# Patient Record
Sex: Female | Born: 1950 | Race: White | Hispanic: No | Marital: Single | Smoking: Former smoker
Health system: Southern US, Community
[De-identification: ages and names within clinical notes are randomized; demographics above are authoritative.]

## PROBLEM LIST (undated history)

## (undated) DIAGNOSIS — E119 Type 2 diabetes mellitus without complications: Secondary | ICD-10-CM

## (undated) DIAGNOSIS — I10 Essential (primary) hypertension: Secondary | ICD-10-CM

---

## 2019-06-09 ENCOUNTER — Other Ambulatory Visit: Payer: Self-pay

## 2019-06-09 ENCOUNTER — Emergency Department: Payer: Medicare Other

## 2019-06-09 ENCOUNTER — Encounter: Payer: Self-pay | Admitting: *Deleted

## 2019-06-09 ENCOUNTER — Emergency Department
Admission: EM | Admit: 2019-06-09 | Discharge: 2019-06-09 | Disposition: A | Discharge status: Home / Self Care | Payer: Medicare Other | Attending: Emergency Medicine | Admitting: Emergency Medicine

## 2019-06-09 DIAGNOSIS — F1010 Alcohol abuse, uncomplicated: Secondary | ICD-10-CM

## 2019-06-09 DIAGNOSIS — F10232 Alcohol dependence with withdrawal with perceptual disturbance: Secondary | ICD-10-CM | POA: Diagnosis not present

## 2019-06-09 DIAGNOSIS — E119 Type 2 diabetes mellitus without complications: Secondary | ICD-10-CM | POA: Insufficient documentation

## 2019-06-09 DIAGNOSIS — Z87891 Personal history of nicotine dependence: Secondary | ICD-10-CM | POA: Insufficient documentation

## 2019-06-09 DIAGNOSIS — R2241 Localized swelling, mass and lump, right lower limb: Secondary | ICD-10-CM | POA: Insufficient documentation

## 2019-06-09 DIAGNOSIS — R4689 Other symptoms and signs involving appearance and behavior: Secondary | ICD-10-CM | POA: Diagnosis present

## 2019-06-09 DIAGNOSIS — R443 Hallucinations, unspecified: Secondary | ICD-10-CM | POA: Insufficient documentation

## 2019-06-09 HISTORY — DX: Type 2 diabetes mellitus without complications: E11.9

## 2019-06-09 LAB — COMPREHENSIVE METABOLIC PANEL
ALT: 45 U/L — ABNORMAL HIGH (ref 0–44)
AST: 119 U/L — ABNORMAL HIGH (ref 15–41)
Albumin: 3.2 g/dL — ABNORMAL LOW (ref 3.5–5.0)
Alkaline Phosphatase: 87 U/L (ref 38–126)
Anion gap: 9 (ref 5–15)
BUN: 31 mg/dL — ABNORMAL HIGH (ref 8–23)
CO2: 21 mmol/L — ABNORMAL LOW (ref 22–32)
Calcium: 8.5 mg/dL — ABNORMAL LOW (ref 8.9–10.3)
Chloride: 112 mmol/L — ABNORMAL HIGH (ref 98–111)
Creatinine, Ser: 1.32 mg/dL — ABNORMAL HIGH (ref 0.44–1.00)
GFR calc Af Amer: 48 mL/min — ABNORMAL LOW (ref >60)
GFR calc non Af Amer: 42 mL/min — ABNORMAL LOW (ref >60)
Glucose, Bld: 134 mg/dL — ABNORMAL HIGH (ref 70–99)
Potassium: 3.3 mmol/L — ABNORMAL LOW (ref 3.5–5.1)
Sodium: 142 mmol/L (ref 135–145)
Total Bilirubin: 3.4 mg/dL — ABNORMAL HIGH (ref 0.3–1.2)
Total Protein: 6.1 g/dL — ABNORMAL LOW (ref 6.5–8.1)

## 2019-06-09 LAB — URINE DRUG SCREEN, QUALITATIVE (ARMC ONLY)
Amphetamines, Ur Screen: NOT DETECTED
Barbiturates, Ur Screen: NOT DETECTED
Benzodiazepine, Ur Scrn: NOT DETECTED
Cannabinoid 50 Ng, Ur ~~LOC~~: NOT DETECTED
Cocaine Metabolite,Ur ~~LOC~~: NOT DETECTED
MDMA (Ecstasy)Ur Screen: NOT DETECTED
Methadone Scn, Ur: NOT DETECTED
Opiate, Ur Screen: NOT DETECTED
Phencyclidine (PCP) Ur S: NOT DETECTED
Tricyclic, Ur Screen: NOT DETECTED

## 2019-06-09 LAB — CBC
HCT: 27.3 % — ABNORMAL LOW (ref 36.0–46.0)
Hemoglobin: 9.1 g/dL — ABNORMAL LOW (ref 12.0–15.0)
MCH: 33.8 pg (ref 26.0–34.0)
MCHC: 33.3 g/dL (ref 30.0–36.0)
MCV: 101.5 fL — ABNORMAL HIGH (ref 80.0–100.0)
Platelets: 45 10*3/uL — ABNORMAL LOW (ref 150–400)
RBC: 2.69 MIL/uL — ABNORMAL LOW (ref 3.87–5.11)
RDW: 13.4 % (ref 11.5–15.5)
WBC: 3.5 10*3/uL — ABNORMAL LOW (ref 4.0–10.5)
nRBC: 0 % (ref 0.0–0.2)

## 2019-06-09 LAB — URINALYSIS, COMPLETE (UACMP) WITH MICROSCOPIC
Bacteria, UA: NONE SEEN
Bilirubin Urine: NEGATIVE
Glucose, UA: NEGATIVE mg/dL
Hgb urine dipstick: NEGATIVE
Ketones, ur: NEGATIVE mg/dL
Leukocytes,Ua: NEGATIVE
Nitrite: NEGATIVE
Protein, ur: NEGATIVE mg/dL
Specific Gravity, Urine: 1.018 (ref 1.005–1.030)
pH: 5 (ref 5.0–8.0)

## 2019-06-09 LAB — ACETAMINOPHEN LEVEL: Acetaminophen (Tylenol), Serum: <10 ug/mL — ABNORMAL LOW (ref 10–30)

## 2019-06-09 LAB — ETHANOL: Alcohol, Ethyl (B): 76 mg/dL — ABNORMAL HIGH (ref <10)

## 2019-06-09 LAB — AMMONIA: Ammonia: 32 umol/L (ref 9–35)

## 2019-06-09 LAB — TSH: TSH: 1.92 u[IU]/mL (ref 0.350–4.500)

## 2019-06-09 LAB — SALICYLATE LEVEL: Salicylate Lvl: <7 mg/dL (ref 2.8–30.0)

## 2019-06-09 LAB — GLUCOSE, CAPILLARY: Glucose-Capillary: 118 mg/dL — ABNORMAL HIGH (ref 70–99)

## 2019-06-09 MED ORDER — FOLIC ACID 1 MG PO TABS
2.0000 mg | ORAL_TABLET | Freq: Once | ORAL | Status: AC
  Administered 2019-06-09: 19:00:00 2 mg via ORAL
  Filled 2019-06-09: qty 2

## 2019-06-09 MED ORDER — VITAMIN B-1 100 MG PO TABS
100.0000 mg | ORAL_TABLET | ORAL | Status: AC
  Administered 2019-06-09: 100 mg via ORAL
  Filled 2019-06-09: qty 1

## 2019-06-09 NOTE — ED Triage Notes (Signed)
Pt to ED to talk to psychiatry. Pt denies wanting to tell this RN in triage. Pt is calm and cooperative.   PT also requesting her family only be updated about the behavioral visit but not about any medical treatment.

## 2019-06-09 NOTE — ED Notes (Signed)
Pt anxious, walking away from bed.  States she does still want to speak with a psychiatrist.  Maintained on 15 minute checks.

## 2019-06-09 NOTE — ED Notes (Signed)
Pt speaking with SOC. 

## 2019-06-09 NOTE — ED Notes (Signed)
Throughout the triage process pt verbalized she is here for alcohol detox and resources or admission as needed. Pt denies SI and Denies HI. No hallucinations or drug use.

## 2019-06-09 NOTE — ED Notes (Signed)
Report to include Situation, Background, Assessment, and Recommendations received from Amy B. RN. Patient alert, warm and dry, in no acute distress. Patient denies SI, HI, AVH and pain. Patient made aware of Q15 minute rounds and Rover and Officer presence for their safety. Patient instructed to come to me with needs or concerns.  

## 2019-06-09 NOTE — ED Provider Notes (Signed)
[facility] Provider Note   ____________________________________________   First MD Initiated Contact with Patient 06/09/19 1615     (approximate)  I have reviewed the triage vital signs and the nursing notes.   HISTORY  Chief Complaint Behavior Problem    HPI Sheila [facility]man is a 68 y.o. female evaluation for "alcohol"  Patient tells me that her family asked that she come here for evaluation because of an alcohol problem.  She reports she has been drinking will drink several drinks of alcohol a day, has been a drinker for several years but has increased her alcohol consumption since retiring from [employer].  Over the last 3 to 4 weeks she is also started noticed at night that she feels at times that she hears things are like someone is breaking into her home.  She acknowledges these are not real but feels that she is having hallucinations at night of people breaking into her house.  Denies any desire to hurt her self or harm anyone else.  She does report she is a heavy drinker, she does drink regularly.  She also reports that she does have a history of liver disease.   Past Medical History:  Diagnosis Date   Diabetes mellitus without complication (HCC)     There are no active problems to display for this patient.     Prior to Admission medications   Not on File    Allergies Patient has no allergy information on record.  No family history on file.  Social History Social History   Tobacco Use   Smoking status: Former Smoker   Smokeless tobacco: Never Used  Substance Use Topics   Alcohol use: Yes    Comment: daily   Drug use: Never    Review of Systems Constitutional: No fever/chills and no recent illness Eyes: No visual changes. ENT: No sore throat. Cardiovascular: Denies chest pain. Respiratory: Denies shortness of breath. Gastrointestinal: No abdominal pain.   Genitourinary: Negative for  dysuria. Musculoskeletal: Negative for back pain. Skin: Negative for rash. Neurological: Negative for headaches, areas of focal weakness or numbness.    ____________________________________________   PHYSICAL EXAM:  VITAL SIGNS: ED Triage Vitals  Enc Vitals Group     BP 06/09/19 1536 (!) 131/101     Pulse Rate 06/09/19 1536 77     Resp 06/09/19 1536 16     Temp 06/09/19 1536 98.7 F (37.1 C)     Temp Source 06/09/19 1536 Oral     SpO2 06/09/19 1536 97 %     Weight 06/09/19 1537 137 lb (62.1 kg)     Height 06/09/19 1537 5' 4.75" (1.645 m)     Head Circumference --      Peak Flow --      Pain Score 06/09/19 1537 0     Pain Loc --      Pain Edu? --      Excl. in GC? --     Constitutional: Alert and oriented. Well appearing and in no acute distress.  She is pleasant and calm.  Shows no distress or tremors. Eyes: Conjunctivae are normal. Head: Atraumatic. Nose: No congestion/rhinnorhea. Mouth/Throat: Mucous membranes are moist. Neck: No stridor.  Cardiovascular: Normal rate, regular rhythm. Grossly normal heart sounds very soft systolic murmur.  Good peripheral circulation. Respiratory: Normal respiratory effort.  No retractions. Lungs CTAB. Gastrointestinal: Soft and nontender. No distention. Musculoskeletal: No lower extremity tenderness nor edema except the right lower extremity seems slightly swollen compared  to the left.  No nystagmus.  No ataxia.  Upper extremities normal movements without tremor. Neurologic:  Normal speech and language. No gross focal neurologic deficits are appreciated.  Skin:  Skin is warm, dry and intact. No rash noted except for some lesions over the anterior shins bilateral which patient reports are psoriasis and appear consistent with without signs of superinfection. Psychiatric: Mood and affect are normal. Speech and behavior are normal.  She is pleasant.  Well oriented.  Denies desire to harm herself or anyone else.  No confabulation noted.  No  confusion noted.  Alert and well oriented able to give a reasonable history without tangential story.  ____________________________________________   LABS (all labs ordered are listed, but only abnormal results are displayed)  Labs Reviewed  COMPREHENSIVE METABOLIC PANEL - Abnormal; Notable for the following components:      Result Value   Potassium 3.3 (*)    Chloride 112 (*)    CO2 21 (*)    Glucose, Bld 134 (*)    BUN 31 (*)    Creatinine, Ser 1.32 (*)    Calcium 8.5 (*)    Total Protein 6.1 (*)    Albumin 3.2 (*)    AST 119 (*)    ALT 45 (*)    Total Bilirubin 3.4 (*)    GFR calc non Af Amer 42 (*)    GFR calc Af Amer 48 (*)    All other components within normal limits  ETHANOL - Abnormal; Notable for the following components:   Alcohol, Ethyl (B) 76 (*)    All other components within normal limits  ACETAMINOPHEN LEVEL - Abnormal; Notable for the following components:   Acetaminophen (Tylenol), Serum <10 (*)    All other components within normal limits  CBC - Abnormal; Notable for the following components:   WBC 3.5 (*)    RBC 2.69 (*)    Hemoglobin 9.1 (*)    HCT 27.3 (*)    MCV 101.5 (*)    Platelets 45 (*)    All other components within normal limits  GLUCOSE, CAPILLARY - Abnormal; Notable for the following components:   Glucose-Capillary 118 (*)    All other components within normal limits  SALICYLATE LEVEL  URINE DRUG SCREEN, QUALITATIVE ([facility] ONLY)  AMMONIA  TSH  URINALYSIS, COMPLETE (UACMP) WITH MICROSCOPIC   Lab work is reviewed, notable is a somewhat picture of pancytopenia.  However outpatient evaluation in the [facility] system demonstrates similar in the past, also has a history of transaminitis and cryptogenic cirrhosis.  I do not find acute changes in her lab work that is resulted to this point at 440, but I will add on a TSH and ammonia as well.  The [facility] system in November her T bili was 3.8, AST was also elevated at 78, somewhat elevated compared to  been, but has had similar LFTs as well back to about 2017. ____________________________________________  EKG   ____________________________________________  RADIOLOGY  Ct Head Wo Contrast  Result Date: 06/09/2019 CLINICAL DATA:  Hallucinations, history diabetes mellitus, former smoker EXAM: CT HEAD WITHOUT CONTRAST TECHNIQUE: Contiguous axial images were obtained from the base of the skull through the vertex without intravenous contrast. Sagittal and coronal MPR images reconstructed from axial data set. COMPARISON:  None FINDINGS: Brain: Mild atrophy. Normal ventricular morphology. No midline shift or mass effect. Minimal small vessel chronic ischemic changes of deep cerebral white matter. Benign-appearing basal ganglia calcifications. No intracranial hemorrhage, mass lesion, or evidence acute infarction. No  extra-axial fluid collections. Vascular: Unremarkable Skull: Intact Sinuses/Orbits: Clear Other: N/A IMPRESSION: Atrophy with minimal small vessel chronic ischemic changes of deep cerebral white matter. No acute intracranial abnormalities. Electronically Signed   By: Ulyses SouthwardMark  Boles M.D.   On: 06/09/2019 17:27   [place] Venous Img Lower Unilateral Right  Result Date: 06/09/2019 CLINICAL DATA:  68 year old female with a history of edema EXAM: RIGHT LOWER EXTREMITY VENOUS DOPPLER ULTRASOUND TECHNIQUE: Gray-scale sonography with graded compression, as well as color Doppler and duplex ultrasound were performed to evaluate the lower extremity deep venous systems from the level of the common femoral vein and including the common femoral, femoral, profunda femoral, popliteal and calf veins including the posterior tibial, peroneal and gastrocnemius veins when visible. The superficial great saphenous vein was also interrogated. Spectral Doppler was utilized to evaluate flow at rest and with distal augmentation maneuvers in the common femoral, femoral and popliteal veins. COMPARISON:  None. FINDINGS: Contralateral  Common Femoral Vein: Respiratory phasicity is normal and symmetric with the symptomatic side. No evidence of thrombus. Normal compressibility. Common Femoral Vein: No evidence of thrombus. Normal compressibility, respiratory phasicity and response to augmentation. Saphenofemoral Junction: No evidence of thrombus. Normal compressibility and flow on color Doppler imaging. Profunda Femoral Vein: No evidence of thrombus. Normal compressibility and flow on color Doppler imaging. Femoral Vein: No evidence of thrombus. Normal compressibility, respiratory phasicity and response to augmentation. Popliteal Vein: No evidence of thrombus. Normal compressibility, respiratory phasicity and response to augmentation. Calf Veins: No evidence of thrombus. Normal compressibility and flow on color Doppler imaging. Superficial Great Saphenous Vein: No evidence of thrombus. Normal compressibility and flow on color Doppler imaging. Other Findings:  Edema IMPRESSION: Sonographic survey of the right lower extremity negative for DVT Edema of the right leg Electronically Signed   By: Gilmer MorJaime  Wagner D.O.   On: 06/09/2019 17:12    CT head negative for acute.  Right lower extremity negative for DVT.  Based on my history and review I suspect her lower extremity may be secondary to her liver disease ____________________________________________   PROCEDURES  Procedure(s) performed: None  Procedures  Critical Care performed: No  ____________________________________________   INITIAL IMPRESSION / ASSESSMENT AND PLAN / ED COURSE  Pertinent labs & imaging results that were available during my care of the patient were reviewed by me and considered in my medical decision making (see chart for details).   Patient presents for concerns of alcohol abuse, voluntary.  Associated with occasional hallucinations or paranoia at night.  Clinical Course as of Jun 08 2016  Tue Jun 09, 2019  1825 Patient conversing with tele-psychiatrist    [MQ]    Clinical Course User Index [MQ] Sharyn CreamerQuale, Cameren Odwyer, MD   Ammonia  TSH reviewed  A known pancytopenia is present.  Transaminitis is also present but appears to be chronic in nature I do not believe is contributing to her mental status changes today.   CT the head  RLE ultrasound   ----------------------------------------- 8:18 PM on 06/09/2019 -----------------------------------------  Patient discharged.  Patient agreeable.  She will follow closely with her primary doctor at [facility], and also seek outpatient treatment including AA.  I discussed the case with Dr. Annitta JerseySpragg of psychiatry who also agrees with outpatient, patient does not currently wish to receive detox treatment as an inpatient.  Return precautions and treatment recommendations and follow-up discussed with the patient who is agreeable with the plan.   ____________________________________________   FINAL CLINICAL IMPRESSION(S) / ED DIAGNOSES  Final diagnoses:  Alcohol abuse  Alcohol dependence with perceptual disturbance [facility](HCC)        Note:  This document was prepared using Dragon voice recognition software and may include unintentional dictation errors       Sharyn CreamerQuale, Mette Southgate, MD 06/09/19 2018

## 2019-06-09 NOTE — Discharge Instructions (Signed)

## 2020-02-18 ENCOUNTER — Emergency Department: Payer: Medicare Other

## 2020-02-18 ENCOUNTER — Other Ambulatory Visit: Payer: Self-pay

## 2020-02-18 ENCOUNTER — Encounter: Payer: Self-pay | Admitting: Emergency Medicine

## 2020-02-18 ENCOUNTER — Inpatient Hospital Stay
Admission: EM | Admit: 2020-02-18 | Discharge: 2020-02-25 | DRG: 896 | Disposition: A | Discharge status: Home Health | Payer: Medicare Other | Attending: Internal Medicine | Admitting: Internal Medicine

## 2020-02-18 DIAGNOSIS — Z66 Do not resuscitate: Secondary | ICD-10-CM

## 2020-02-18 DIAGNOSIS — G9341 Metabolic encephalopathy: Secondary | ICD-10-CM | POA: Diagnosis not present

## 2020-02-18 DIAGNOSIS — G9340 Encephalopathy, unspecified: Secondary | ICD-10-CM

## 2020-02-18 DIAGNOSIS — Z6822 Body mass index (BMI) 22.0-22.9, adult: Secondary | ICD-10-CM

## 2020-02-18 DIAGNOSIS — Z79899 Other long term (current) drug therapy: Secondary | ICD-10-CM

## 2020-02-18 DIAGNOSIS — F10251 Alcohol dependence with alcohol-induced psychotic disorder with hallucinations: Secondary | ICD-10-CM | POA: Diagnosis not present

## 2020-02-18 DIAGNOSIS — F10239 Alcohol dependence with withdrawal, unspecified: Secondary | ICD-10-CM | POA: Diagnosis not present

## 2020-02-18 DIAGNOSIS — R410 Disorientation, unspecified: Secondary | ICD-10-CM

## 2020-02-18 DIAGNOSIS — F1019 Alcohol abuse with unspecified alcohol-induced disorder: Secondary | ICD-10-CM | POA: Diagnosis not present

## 2020-02-18 DIAGNOSIS — D696 Thrombocytopenia, unspecified: Secondary | ICD-10-CM | POA: Diagnosis present

## 2020-02-18 DIAGNOSIS — E119 Type 2 diabetes mellitus without complications: Secondary | ICD-10-CM

## 2020-02-18 DIAGNOSIS — I10 Essential (primary) hypertension: Secondary | ICD-10-CM | POA: Diagnosis not present

## 2020-02-18 DIAGNOSIS — Z87891 Personal history of nicotine dependence: Secondary | ICD-10-CM

## 2020-02-18 DIAGNOSIS — F909 Attention-deficit hyperactivity disorder, unspecified type: Secondary | ICD-10-CM | POA: Diagnosis present

## 2020-02-18 DIAGNOSIS — E876 Hypokalemia: Secondary | ICD-10-CM | POA: Diagnosis not present

## 2020-02-18 DIAGNOSIS — D638 Anemia in other chronic diseases classified elsewhere: Secondary | ICD-10-CM | POA: Diagnosis present

## 2020-02-18 DIAGNOSIS — E878 Other disorders of electrolyte and fluid balance, not elsewhere classified: Secondary | ICD-10-CM | POA: Diagnosis present

## 2020-02-18 DIAGNOSIS — I5032 Chronic diastolic (congestive) heart failure: Secondary | ICD-10-CM | POA: Diagnosis present

## 2020-02-18 DIAGNOSIS — Z7189 Other specified counseling: Secondary | ICD-10-CM

## 2020-02-18 DIAGNOSIS — E11649 Type 2 diabetes mellitus with hypoglycemia without coma: Secondary | ICD-10-CM | POA: Diagnosis not present

## 2020-02-18 DIAGNOSIS — K7030 Alcoholic cirrhosis of liver without ascites: Secondary | ICD-10-CM | POA: Diagnosis not present

## 2020-02-18 DIAGNOSIS — I110 Hypertensive heart disease with heart failure: Secondary | ICD-10-CM | POA: Diagnosis present

## 2020-02-18 DIAGNOSIS — Z23 Encounter for immunization: Secondary | ICD-10-CM

## 2020-02-18 DIAGNOSIS — R627 Adult failure to thrive: Secondary | ICD-10-CM

## 2020-02-18 DIAGNOSIS — Z515 Encounter for palliative care: Secondary | ICD-10-CM

## 2020-02-18 DIAGNOSIS — Z20822 Contact with and (suspected) exposure to covid-19: Secondary | ICD-10-CM | POA: Diagnosis present

## 2020-02-18 DIAGNOSIS — Z8249 Family history of ischemic heart disease and other diseases of the circulatory system: Secondary | ICD-10-CM

## 2020-02-18 DIAGNOSIS — R296 Repeated falls: Secondary | ICD-10-CM | POA: Diagnosis present

## 2020-02-18 DIAGNOSIS — Y904 Blood alcohol level of 80-99 mg/100 ml: Secondary | ICD-10-CM | POA: Diagnosis present

## 2020-02-18 HISTORY — DX: Essential (primary) hypertension: I10

## 2020-02-18 LAB — URINALYSIS, COMPLETE (UACMP) WITH MICROSCOPIC
Bilirubin Urine: NEGATIVE
Glucose, UA: NEGATIVE mg/dL
Ketones, ur: NEGATIVE mg/dL
Nitrite: POSITIVE — AB
Protein, ur: 30 mg/dL — AB
Specific Gravity, Urine: 1.02 (ref 1.005–1.030)
pH: 6 (ref 5.0–8.0)

## 2020-02-18 LAB — COMPREHENSIVE METABOLIC PANEL
ALT: 32 U/L (ref 0–44)
AST: 87 U/L — ABNORMAL HIGH (ref 15–41)
Albumin: 3.2 g/dL — ABNORMAL LOW (ref 3.5–5.0)
Alkaline Phosphatase: 94 U/L (ref 38–126)
Anion gap: 8 (ref 5–15)
BUN: 13 mg/dL (ref 8–23)
CO2: 26 mmol/L (ref 22–32)
Calcium: 8.5 mg/dL — ABNORMAL LOW (ref 8.9–10.3)
Chloride: 111 mmol/L (ref 98–111)
Creatinine, Ser: 0.66 mg/dL (ref 0.44–1.00)
GFR calc Af Amer: >60 mL/min (ref >60)
GFR calc non Af Amer: >60 mL/min (ref >60)
Glucose, Bld: 85 mg/dL (ref 70–99)
Potassium: 3.2 mmol/L — ABNORMAL LOW (ref 3.5–5.1)
Sodium: 145 mmol/L (ref 135–145)
Total Bilirubin: 5.2 mg/dL — ABNORMAL HIGH (ref 0.3–1.2)
Total Protein: 6.6 g/dL (ref 6.5–8.1)

## 2020-02-18 LAB — URINE DRUG SCREEN, QUALITATIVE (ARMC ONLY)
Amphetamines, Ur Screen: NOT DETECTED
Barbiturates, Ur Screen: NOT DETECTED
Benzodiazepine, Ur Scrn: NOT DETECTED
Cannabinoid 50 Ng, Ur ~~LOC~~: NOT DETECTED
Cocaine Metabolite,Ur ~~LOC~~: NOT DETECTED
MDMA (Ecstasy)Ur Screen: NOT DETECTED
Methadone Scn, Ur: NOT DETECTED
Opiate, Ur Screen: NOT DETECTED
Phencyclidine (PCP) Ur S: NOT DETECTED
Tricyclic, Ur Screen: NOT DETECTED

## 2020-02-18 LAB — CBC
HCT: 28.3 % — ABNORMAL LOW (ref 36.0–46.0)
Hemoglobin: 9.1 g/dL — ABNORMAL LOW (ref 12.0–15.0)
MCH: 31.9 pg (ref 26.0–34.0)
MCHC: 32.2 g/dL (ref 30.0–36.0)
MCV: 99.3 fL (ref 80.0–100.0)
Platelets: 56 10*3/uL — ABNORMAL LOW (ref 150–400)
RBC: 2.85 MIL/uL — ABNORMAL LOW (ref 3.87–5.11)
RDW: 14.2 % (ref 11.5–15.5)
WBC: 4.3 10*3/uL (ref 4.0–10.5)
nRBC: 0 % (ref 0.0–0.2)

## 2020-02-18 LAB — HIV ANTIBODY (ROUTINE TESTING W REFLEX): HIV Screen 4th Generation wRfx: NONREACTIVE

## 2020-02-18 LAB — GLUCOSE, CAPILLARY
Glucose-Capillary: 80 mg/dL (ref 70–99)
Glucose-Capillary: 79 mg/dL (ref 70–99)
Glucose-Capillary: 72 mg/dL (ref 70–99)
Glucose-Capillary: 73 mg/dL (ref 70–99)

## 2020-02-18 LAB — C-REACTIVE PROTEIN: CRP: 0.6 mg/dL (ref <1.0)

## 2020-02-18 LAB — TSH: TSH: 2.88 u[IU]/mL (ref 0.350–4.500)

## 2020-02-18 LAB — SEDIMENTATION RATE: Sed Rate: 14 mm/hr (ref 0–30)

## 2020-02-18 LAB — VITAMIN B12: Vitamin B-12: 758 pg/mL (ref 180–914)

## 2020-02-18 LAB — RESPIRATORY PANEL BY RT PCR (FLU A&B, COVID)
Influenza A by PCR: NEGATIVE
Influenza B by PCR: NEGATIVE
SARS Coronavirus 2 by RT PCR: NEGATIVE

## 2020-02-18 LAB — PHOSPHORUS: Phosphorus: 3.6 mg/dL (ref 2.5–4.6)

## 2020-02-18 LAB — AMMONIA: Ammonia: 48 umol/L — ABNORMAL HIGH (ref 9–35)

## 2020-02-18 LAB — MAGNESIUM: Magnesium: 1.9 mg/dL (ref 1.7–2.4)

## 2020-02-18 LAB — HEMOGLOBIN A1C
Hgb A1c MFr Bld: 4.7 % — ABNORMAL LOW (ref 4.8–5.6)
Mean Plasma Glucose: 88.19 mg/dL

## 2020-02-18 MED ORDER — INSULIN ASPART 100 UNIT/ML ~~LOC~~ SOLN: 0.0000 [IU] | Freq: Every day | SUBCUTANEOUS | Status: DC

## 2020-02-18 MED ORDER — FOLIC ACID 1 MG PO TABS
1.0000 mg | ORAL_TABLET | Freq: Every day | ORAL | Status: DC
  Administered 2020-02-19 – 2020-02-25 (×6): 1 mg via ORAL
  Filled 2020-02-18 (×7): qty 1

## 2020-02-18 MED ORDER — ACETAMINOPHEN 325 MG PO TABS
650.0000 mg | ORAL_TABLET | Freq: Four times a day (QID) | ORAL | Status: DC | PRN
  Administered 2020-02-18: 650 mg via ORAL
  Filled 2020-02-18: qty 2

## 2020-02-18 MED ORDER — ONDANSETRON HCL 4 MG/2ML IJ SOLN: 4.0000 mg | Freq: Four times a day (QID) | INTRAMUSCULAR | Status: DC | PRN

## 2020-02-18 MED ORDER — ONDANSETRON HCL 4 MG PO TABS: 4.0000 mg | ORAL_TABLET | Freq: Four times a day (QID) | ORAL | Status: DC | PRN

## 2020-02-18 MED ORDER — POTASSIUM CHLORIDE CRYS ER 20 MEQ PO TBCR
40.0000 meq | EXTENDED_RELEASE_TABLET | Freq: Every day | ORAL | Status: DC
  Administered 2020-02-18 – 2020-02-25 (×7): 40 meq via ORAL
  Filled 2020-02-18 (×8): qty 2

## 2020-02-18 MED ORDER — FOLIC ACID 1 MG PO TABS: 1.0000 mg | ORAL_TABLET | Freq: Every day | ORAL | Status: DC

## 2020-02-18 MED ORDER — LORAZEPAM 1 MG PO TABS
1.0000 mg | ORAL_TABLET | ORAL | Status: AC | PRN
  Filled 2020-02-18: qty 2

## 2020-02-18 MED ORDER — SODIUM CHLORIDE 0.9% FLUSH
3.0000 mL | Freq: Two times a day (BID) | INTRAVENOUS | Status: DC
  Administered 2020-02-18 – 2020-02-25 (×12): 3 mL via INTRAVENOUS

## 2020-02-18 MED ORDER — LORAZEPAM 2 MG/ML IJ SOLN
1.0000 mg | INTRAMUSCULAR | Status: AC | PRN
  Administered 2020-02-18 – 2020-02-20 (×3): 2 mg via INTRAVENOUS
  Administered 2020-02-20: 4 mg via INTRAVENOUS
  Administered 2020-02-20: 2 mg via INTRAVENOUS
  Administered 2020-02-20: 3 mg via INTRAVENOUS
  Administered 2020-02-21: 2 mg via INTRAVENOUS
  Administered 2020-02-21: 04:00:00 4 mg via INTRAVENOUS
  Filled 2020-02-18 (×2): qty 1
  Filled 2020-02-18: qty 2
  Filled 2020-02-18 (×2): qty 1

## 2020-02-18 MED ORDER — LORAZEPAM 2 MG/ML IJ SOLN
0.0000 mg | Freq: Four times a day (QID) | INTRAMUSCULAR | Status: AC
  Filled 2020-02-18: qty 1

## 2020-02-18 MED ORDER — THIAMINE HCL 100 MG/ML IJ SOLN
100.0000 mg | Freq: Once | INTRAMUSCULAR | Status: AC
  Administered 2020-02-18: 09:00:00 100 mg via INTRAVENOUS
  Filled 2020-02-18: qty 2

## 2020-02-18 MED ORDER — IRBESARTAN 150 MG PO TABS
300.0000 mg | ORAL_TABLET | Freq: Every day | ORAL | Status: DC
  Administered 2020-02-18 – 2020-02-25 (×7): 300 mg via ORAL
  Filled 2020-02-18 (×8): qty 2

## 2020-02-18 MED ORDER — LORAZEPAM 2 MG PO TABS
0.0000 mg | ORAL_TABLET | Freq: Two times a day (BID) | ORAL | Status: AC
  Administered 2020-02-21: 2 mg via ORAL
  Filled 2020-02-18: qty 1

## 2020-02-18 MED ORDER — LORAZEPAM 2 MG PO TABS
0.0000 mg | ORAL_TABLET | Freq: Four times a day (QID) | ORAL | Status: AC
  Administered 2020-02-18 – 2020-02-19 (×2): 2 mg via ORAL
  Filled 2020-02-18 (×2): qty 1

## 2020-02-18 MED ORDER — ACETAMINOPHEN 650 MG RE SUPP: 650.0000 mg | Freq: Four times a day (QID) | RECTAL | Status: DC | PRN

## 2020-02-18 MED ORDER — METHYLPHENIDATE HCL ER (OSM) 36 MG PO TBCR: 36.0000 mg | EXTENDED_RELEASE_TABLET | Freq: Every morning | ORAL | Status: DC

## 2020-02-18 MED ORDER — ENSURE ENLIVE PO LIQD
237.0000 mL | Freq: Two times a day (BID) | ORAL | Status: DC
  Administered 2020-02-19 – 2020-02-23 (×4): 237 mL via ORAL

## 2020-02-18 MED ORDER — LACTULOSE 10 GM/15ML PO SOLN: 10.0000 g | Freq: Two times a day (BID) | ORAL | Status: DC | PRN

## 2020-02-18 MED ORDER — HYDROCHLOROTHIAZIDE 25 MG PO TABS
12.5000 mg | ORAL_TABLET | Freq: Every day | ORAL | Status: DC
  Administered 2020-02-18 – 2020-02-25 (×7): 12.5 mg via ORAL
  Filled 2020-02-18 (×8): qty 1

## 2020-02-18 MED ORDER — THIAMINE HCL 100 MG/ML IJ SOLN
400.0000 mg | Freq: Once | INTRAMUSCULAR | Status: AC
  Administered 2020-02-18: 400 mg via INTRAVENOUS
  Filled 2020-02-18: qty 4

## 2020-02-18 MED ORDER — LORAZEPAM 2 MG/ML IJ SOLN
0.0000 mg | Freq: Two times a day (BID) | INTRAMUSCULAR | Status: AC
  Administered 2020-02-20 – 2020-02-21 (×2): 2 mg via INTRAVENOUS
  Administered 2020-02-22: 1 mg via INTRAVENOUS
  Filled 2020-02-18: qty 2
  Filled 2020-02-18: qty 1
  Filled 2020-02-18: qty 2
  Filled 2020-02-18 (×2): qty 1

## 2020-02-18 MED ORDER — ADULT MULTIVITAMIN W/MINERALS CH
1.0000 | ORAL_TABLET | Freq: Every day | ORAL | Status: DC
  Administered 2020-02-18 – 2020-02-25 (×7): 1 via ORAL
  Filled 2020-02-18 (×8): qty 1

## 2020-02-18 MED ORDER — INSULIN ASPART 100 UNIT/ML ~~LOC~~ SOLN
0.0000 [IU] | Freq: Three times a day (TID) | SUBCUTANEOUS | Status: DC
  Administered 2020-02-19: 1 [IU] via SUBCUTANEOUS
  Filled 2020-02-18: qty 1

## 2020-02-18 MED ORDER — THIAMINE HCL 100 MG/ML IJ SOLN
500.0000 mg | INTRAVENOUS | Status: DC
  Filled 2020-02-18: qty 5

## 2020-02-18 MED ORDER — INFLUENZA VAC A&B SA ADJ QUAD 0.5 ML IM PRSY
0.5000 mL | PREFILLED_SYRINGE | INTRAMUSCULAR | Status: AC
  Administered 2020-02-20: 10:00:00 0.5 mL via INTRAMUSCULAR
  Filled 2020-02-18: qty 0.5

## 2020-02-18 MED ORDER — SODIUM CHLORIDE 0.9 % IV SOLN
1.0000 mg | Freq: Once | INTRAVENOUS | Status: AC
  Administered 2020-02-18: 1 mg via INTRAVENOUS
  Filled 2020-02-18: qty 0.2

## 2020-02-18 NOTE — ED Notes (Signed)
Pt assisted to the bathroom by this RN. Pt noted to continue to have bizarre behavior with this RN. Pt noted to have began to urinate on pants, this RN assisted patient out of wet clothes and into dry gown and onto clean dry pad. Pt provided with blankets. Pt's daughter remains at bedside at this time.

## 2020-02-18 NOTE — H&P (Signed)
TRH H&P   Patient Demographics:    Sheila [facility]man, is a 69 y.o. female  MRN: [id number]   DOB - 1951-12-14  Admit Date - 02/18/2020  Outpatient Primary MD for the [facility] is [facility]  Referring MD Dr. Ellender Hose  Outpatient Specialists: None  Patient coming from: Home  Chief Complaint  Patient presents with   Altered Mental Status      HPI:    [facility]ila [facility]man  is a 69 y.o. female, with ongoing alcohol use with cryptogenic cirrhosis, chronic diastolic CHF, hypertension, diet-controlled diabetes, ADHD who was brought in by police after being found at [facility] confused this morning.  She reports that she drove there this morning after having a cocktail mix.  Reportedly she drove up without her driving license.  On questioning she reports that she was brought in here to get some tests done.  Patient is oriented to time and place but informs that she has several of the people living in her house.  Daughter present at bedside who informs that for past 8-9 months patient has been persistently getting confused, drinks regularly and drives.  She has poor sleep and has hallucinates seeing and hearing people and also gets easily distracted and confused, fills up her thoughts with unreliable stories.  She has been found trespassing in the neighborhood, daughter also concerned about falls at home and occasionally swirling feces. She was seen in the ED about 8 months back with problems with alcohol, hearing voices.  She was discharged from the ED and instructed to follow-up with her PCP.  She was seen by her PCP in September 2020, no intervention regarding her current problems were done.  She was referred to hepatology for her cirrhosis but does not follow regularly.  Patient is unable to provide a consistent history.  Patient lives alone but thinks several other people live in her  house.  Daughter visits her every other day and helps her with the meals and run errands.  She had reached out to the PCP office regarding her problems a few times earlier and was recommended to bring her to the ED but patient was refusing. Patient denies any headache, blurred vision, dizziness, falls at home, syncope, nausea, vomiting, fevers, chills, chest pain, shortness of breath, palpitations, fevers, abdominal pain, diarrhea or dysuria, tingling or numbness of extremities or muscle weakness.  She denies hearing voices or objects crawling on her skin. On asking about her alcohol intake she reports that she drinks almost on a regular basis but usually 1-2 drinks a day.  Denies any illicit drug use.  She reports that she lost some weight few months ago but now seem to have gained them back.  Course in the ED Vital stable except for elevated blood pressure 160/96 mmHg.  Blood work showed hemoglobin of 9.1 which seems unchanged from previous, chronic thrombocytopenia with platelets  of 56, potassium of 3.2, AST of 87 and glucose of 85.  Alcohol level was 91.  Ammonia level was elevated at 48.  Head CT was done without acute findings and showed moderate parenchymal atrophy and chronic small vessel ischemic disease unchanged from last CT 8 months ago.  With concern for Wernicke's encephalopathy patient given IV thiamine 500 mg and IV folate.  Hospitalist consulted for observation.   Review of systems:    In addition to the HPI above,  No Fever-chills, confusion, hallucination No Headache, No changes with Vision or hearing, No problems swallowing food or Liquids, No Chest pain, Cough or Shortness of Breath, No Abdominal pain, No Nausea or vomiting, Bowel movements are regular, No Blood in stool or Urine, No dysuria, No new skin rashes or bruises, No new joints pains-aches,  No new weakness, tingling, numbness in any extremity, No recent weight gain or loss, No polyuria, polydypsia or  polyphagia, No significant Mental Stressors.     With Past History of the following :    Past Medical History:  Diagnosis Date   Diabetes mellitus without complication (HCC)    Hypertension       History reviewed. No pertinent surgical history.    Social History:     Social History   Tobacco Use   Smoking status: Former Smoker   Smokeless tobacco: Never Used  Substance Use Topics   Alcohol use: Yes    Comment: daily     Lives -home alone  Mobility -independent     Family History :   No family history of heart disease or diabetes.   Home Medications:   Prior to Admission medications   Medication Sig Start Date End Date Taking? Authorizing Provider  hydrochlorothiazide (HYDRODIURIL) 12.5 MG tablet Take 12.5 mg by mouth daily. 05/27/19 05/26/20 Yes [provider]  methylphenidate 36 MG PO CR tablet Take 36 mg by mouth every morning. 09/25/19  Yes [provider]  olmesartan (BENICAR) 40 MG tablet Take 40 mg by mouth daily. 05/27/19 05/26/20 Yes [provider]  potassium chloride (KLOR-CON) 10 MEQ tablet Take 10 mEq by mouth daily. 10/13/19  Yes [provider]     Allergies:    No Known Allergies   Physical Exam:   Vitals  Blood pressure (!) 160/96, pulse 90, temperature 98.4 F (36.9 C), temperature source Oral, resp. rate 15, height 5\' 5"  (1.651 m), weight 56.7 kg, SpO2 100 %.    General: Elderly female lying in bed in no acute distress HEENT: Pupils reactive bilaterally, EOMI, pallor present, no icterus, moist mucosa, supple neck, no cervical adenopathy Chest: Clear to auscultation bilaterally CVS: Normal S1-S2, no murmurs rub or gallop GI: Soft, nondistended, nontender, bowel sounds present Musculoskeletal: Warm, trace pitting edema bilaterally CNS: Alert and oriented x3 (easily distracted and hallucinating about people living in her house), no nystagmus, normal motor tone and power in all extremities, gait  not assessed.    Data Review:    CBC Recent Labs  Lab 02/18/20 0739  WBC 4.3  HGB 9.1*  HCT 28.3*  PLT 56*  MCV 99.3  MCH 31.9  MCHC 32.2  RDW 14.2   ------------------------------------------------------------------------------------------------------------------  Chemistries  Recent Labs  Lab 02/18/20 0739  NA 145  K 3.2*  CL 111  CO2 26  GLUCOSE 85  BUN 13  CREATININE 0.66  CALCIUM 8.5*  AST 87*  ALT 32  ALKPHOS 94  BILITOT 5.2*   ------------------------------------------------------------------------------------------------------------------ estimated creatinine clearance is 60.2 mL/min (by  C-G formula based on SCr of 0.66 mg/dL). ------------------------------------------------------------------------------------------------------------------ No results for input(s): TSH, T4TOTAL, T3FREE, THYROIDAB in the last 72 hours.  Invalid input(s): FREET3  Coagulation profile No results for input(s): INR, PROTIME in the last 168 hours. ------------------------------------------------------------------------------------------------------------------- No results for input(s): DDIMER in the last 72 hours. -------------------------------------------------------------------------------------------------------------------  Cardiac Enzymes No results for input(s): CKMB, TROPONINI, MYOGLOBIN in the last 168 hours.  Invalid input(s): CK ------------------------------------------------------------------------------------------------------------------ No results found for: BNP   ---------------------------------------------------------------------------------------------------------------  Urinalysis    Component Value Date/Time   COLORURINE AMBER (A) 06/09/2019 1543   APPEARANCEUR TURBID (A) 06/09/2019 1543   LABSPEC 1.018 06/09/2019 1543   PHURINE 5.0 06/09/2019 1543   GLUCOSEU NEGATIVE 06/09/2019 1543   HGBUR NEGATIVE 06/09/2019 1543   BILIRUBINUR NEGATIVE  06/09/2019 1543   KETONESUR NEGATIVE 06/09/2019 1543   PROTEINUR NEGATIVE 06/09/2019 1543   NITRITE NEGATIVE 06/09/2019 1543   LEUKOCYTESUR NEGATIVE 06/09/2019 1543    ----------------------------------------------------------------------------------------------------------------   Imaging Results:    CT Head Wo Contrast  Result Date: 02/18/2020 CLINICAL DATA:  Delirium.  Additional history provided: EXAM: CT HEAD WITHOUT CONTRAST TECHNIQUE: Contiguous axial images were obtained from the base of the skull through the vertex without intravenous contrast. COMPARISON:  Head CT 06/09/2019. FINDINGS: Brain: The examination is mildly motion degraded. No evidence of acute intracranial hemorrhage. No demarcated cortical infarction. No evidence of intracranial mass. No midline shift or extra-axial fluid collection. Mild scattered ill-defined hypoattenuation within the cerebral white matter is nonspecific, but consistent with chronic small vessel ischemic disease and similar to prior exam 06/09/2019. Stable, moderate generalized parenchymal atrophy, stable. Vascular: No hyperdense vessel. Skull: Normal. Negative for fracture or focal lesion. Sinuses/Orbits: Visualized orbits demonstrate no acute abnormality. No significant paranasal sinus disease or mastoid effusion at the imaged levels. IMPRESSION: Mildly motion degraded examination. No evidence of acute intracranial abnormality. Moderate generalized parenchymal atrophy with mild chronic small vessel ischemic disease, similar to prior examination 06/09/2019. Electronically Signed   By: Jackey Loge DO   On: 02/18/2020 08:49    My personal review of EKG: Normal sinus rhythm at 87 with PVCs, no ST-T changes.   Assessment & Plan:   Principal problem   Acute metabolic encephalopathy Secondary to alcohol abuse with combination of alcohol intoxication,?  Hepatic encephalopathy and associated Wernicke's versus Korsakoff psychosis. Place in observation on  telemetry. IV hydration with normal saline.  High-dose IV thiamine 500 mg daily for 3 days.  Continue folate and multivitamin. Monitor on CIWA.  No signs of alcohol withdrawal at present but had very high risk.  Check labs for TSH, B12.  Add lactulose to treat for possible hepatic encephalopathy. With concern for Wernicke's versus Korsakoff I have asked neurology to evaluate the patient.  Also consulted psych to assess for capacity given her longstanding confusion and high risk for driving under influence.  TOC consulted to evaluate and provide outpatient resource.  Daughter feels patient is unsafe to live by herself at home.  Active problems   Alcoholic cirrhosis of liver without ascites (HCC) Has chronic thrombocytopenia.  Monitor for now.  Patient has been referred to liver clinic by her PCP but has not been adherent.  Hypokalemia Replenished.  Check magnesium    Type 2 diabetes mellitus without complication (HCC) Diet controlled.  Monitor on sliding scale coverage    Essential hypertension Resume HCTZ and ARB.  ADHD On methylphenidate which I will resume.  Daughter concerned that she is taking it without regular follow-up and monitoring and is keeping her awake  and?  Hyperactive all the time.   DVT Prophylaxis: Subcu Lovenox  AM Labs Ordered, also please review Full Orders  Family Communication: Admission, patients condition and plan of care including tests being ordered have been discussed with the patient and her daughter at bedside   Code Status full code  Likely DC to home  Condition: Fair  Consults called: Neurology, psychiatry  Admission status: Observation  Time spent in minutes : 50   Olivene Cookston M.D on 02/18/2020 at 9:28 AM  Between 7am to 7pm - Pager - [phone]. After 7pm go to [email or web address] - password [email or web address]  [facility] - Office  [phone]

## 2020-02-18 NOTE — ED Notes (Signed)
Pt with noted continued bizarre behavior. Pt calm and cooperative at this time with staff. This RN flushed IV after folic acid finished, pt then flinched, but stated "that didn't even feel that, I guess I flinched because I'm just a squirrely person".

## 2020-02-18 NOTE — TOC Initial Note (Addendum)
Transition of Care [facility]) - Initial/Assessment Note    Patient Details  Name: Calia Napp MRN: [id number] Date of Birth: October 07, 1951  Transition of Care [place]) CM/SW Contact:    Adelene Amas, [place] Phone Number: [phone] 02/18/2020, 2:20 PM  Clinical Narrative:                  Patient is having trouble following the conversation and answering questions. Patient's daughter Lestine Box, was with patient and was able to tell this SW, patient has been struggling with alcohol substance issues for many years. Ms. Dani Gobble is concerned that patient is drinking alcohol throughout the day and taking Conserta medication. Ms. Dani Gobble states it is ADHD medication.  Ms. Dani Gobble states that her mother leaves the house when she is intoxicated and will not let anyone know where she is going. Ms. Dani Gobble states "Even if you had the keys she still leaves, she'll go walking." Ms. Dani Gobble stated the patient often becomes delusional as well, and has visual hallucinations. Ms. Dani Gobble states patient is able to pay bills and buy food, but she does not eat the food she buys.  Ms. Dani Gobble stated patient has trouble with keeping up with the house maintenance and with keeping the house clean.  Ms. Dani Gobble states the house is not in good shape and she is concerned for the patient's safety in the house.  Expected Discharge Plan: OP Rehab(Substance abuse rehab facility)     Patient Goals and CMS Choice Patient states their goals for this hospitalization and ongoing recovery are:: Patient would like to return home and be independent.      Expected Discharge Plan and Services Expected Discharge Plan: OP Rehab(Substance abuse rehab facility)       Living arrangements for the past 2 months: Single Family Home                                      Prior Living Arrangements/Services Living arrangements for the past 2 months: Single Family Home Lives with:: Self Patient language and need for interpreter reviewed::  Yes Do you feel safe going back to the place where you live?: No   Patient was unable to answer question coherently. Daughter stated she is concerned about her mother living on her own and she thinks assited living would be appropiate.  Need for Family Participation in Patient Care: Yes (Comment)(Daugter expressed concerns about mother's ability to make her own decisions.)     Criminal Activity/Legal Involvement Pertinent to Current Situation/Hospitalization: No - Comment as needed  Activities of Daily Living      Permission Sought/Granted Permission sought to share information with : Family Supports Permission granted to share information with : Yes, Verbal Permission Granted     Permission granted to share info w AGENCY: Lestine Box and [place] granted to share info w Relationship: daughter/son     Emotional Assessment Appearance:: Appears stated age, Disheveled Attitude/Demeanor/Rapport: Engaged(Patient participated in conversation but had difficulty following train of thought and conversation.)   Orientation: : Oriented to Self, Oriented to Place Alcohol / Substance Use: Alcohol Use Psych Involvement: Yes (comment)(Psych consult requested.)  Admission diagnosis:  Acute metabolic encephalopathy [X44.81] Patient Active Problem List   Diagnosis Date Noted  . Acute metabolic encephalopathy 85/63/1497  . Alcoholic cirrhosis of liver without ascites ([employer]) 02/18/2020  . Alcohol abuse with alcohol-induced disorder ([place]) 02/18/2020  . Hypokalemia 02/18/2020  .  Type 2 diabetes mellitus without complication (HCC) 02/18/2020  . Essential hypertension 02/18/2020   PCP:  [facility]:   [facility] DELIVERY - [place], [place] - [address] [place] [address] Phone: [phone] Fax: [phone]  [facility] [address] - [place], [place] - [address] [place] [address] Phone: [phone] Fax: [phone]     Social Determinants of Health (SDOH) Interventions    Readmission Risk Interventions No flowsheet data found.

## 2020-02-18 NOTE — ED Notes (Signed)
Meal tray delivered by dining services  

## 2020-02-18 NOTE — Consult Note (Signed)
69 year old female with no past psychiatric history who presents with altered mental status.  Patient was unable to be evaluated because she was sleeping, however, her daughter was at bedside to provide history.  Per the daughter, the patient has not been acting within her right mind.  Daughter feels as if this might be early onset dementia.  She denies that patient has any diagnosis of bipolar schizophrenia.  She also denies any previous episodes of this magnitude of bizarreness.  She does endorse that the patient is taking Concerta but fears that the patient may be addicted and potentially overtaking some of this medication.  She denies any suicidal or homicidal comments.   Plan:  -Continue to search out causes of AMS including hepatic encephalopathy due to the patient's compromised liver status.  -Hold Concerta as this medication is not appropriate for this patient at this time  -Psychiatry will be by to evaluate when patient is more medically stable

## 2020-02-18 NOTE — ED Notes (Signed)
Called pt's daughter to let her know of pt's room number

## 2020-02-18 NOTE — BH Assessment (Signed)
Patient by Psych MD, Dr. Cindi Carbon and informed writer she doesn't need to be seen by TTS. She's going to be admitted to medical floor.

## 2020-02-18 NOTE — ED Notes (Signed)
Message sent to pharmacy regarding patient's medications.

## 2020-02-18 NOTE — ED Triage Notes (Addendum)
Patient is oriented, but sometimes takes awhile to answer a question in a roundabout way.  Says she was supposed to meet family at [employer], but does not know why they did not show up yet.  She talks about it being an "evening"   I told her it was morning.   She has a daughter that the police have called and she is coming here.  I asked the patient if she wants her daughter to come and stay in her room, and she said she would.

## 2020-02-18 NOTE — Progress Notes (Addendum)
Patient arrived from ED to rm 244. Patient stated she sees orange mice under her closet door while pointing in her hospital room. Patient appears to be very restless while scratching herself. Applied mitts to patients hands, placed floor mats and turned on bed alarm for safety. Spoke to daughter on the phone and daughter stated her mother drinks often at her home and that she is with drawling from alcohol. Daughter is designated Equities trader and plans to visit tomorrow.

## 2020-02-18 NOTE — Progress Notes (Signed)
Initial Nutrition Assessment  DOCUMENTATION CODES:   Not applicable  INTERVENTION:   Ensure Enlive po BID, each supplement provides 350 kcal and 20 grams of protein  MVI, thiamine and folic acid in setting of etoh abuse   Pt likely at high refeed risk; recommend monitor K, Mg and P labs daily until stable  NUTRITION DIAGNOSIS:   Increased nutrient needs related to chronic illness(cirrhosis) as evidenced by increased estimated needs.  GOAL:   Patient will meet greater than or equal to 90% of their needs  MONITOR:   PO intake, Supplement acceptance, Labs, Weight trends, Skin, I & O's  REASON FOR ASSESSMENT:   Malnutrition Screening Tool    ASSESSMENT:   69 y.o. female, with history of A. fib on anticoagulation, chronic pancreatic insufficiency, chronic back pain COPD, rheumatoid arthritis on methotrexate and Arentz who was brought with confusion.  RD working remotely.  Unable to speak with pt r/t confusion and AMS. Suspect pt with poor appetite and oral intake at baseline r/t etoh abuse. RD will add supplements to help pt meet her estimated needs. Pt is at refeed risk. Per chart, pt appears fairly weight stable pta.   Pt at high risk for malnutrition  Medications reviewed and include: folic acid, insulin, MVI, KCl, thiamine  Labs reviewed: K 3.2(L), P 3.6 wnl, Mg 1.9 wnl Ammonia 48(H) B12- 758 Hgb 9.1(L), Hct 28.3(L)  Unable to complete Nutrition-Focused physical exam at this time.   Diet Order:   Diet Order            Diet regular Room service appropriate? Yes; Fluid consistency: Thin  Diet effective now             EDUCATION NEEDS:   No education needs have been identified at this time  Skin:  Skin Assessment: Reviewed RN Assessment  Last BM:  pta  Height:   Ht Readings from Last 1 Encounters:  02/18/20 5\' 6"  (1.676 m)    Weight:   Wt Readings from Last 1 Encounters:  02/18/20 62.3 kg    Ideal Body Weight:  59 kg  BMI:  Body mass index  is 22.17 kg/m.  Estimated Nutritional Needs:   Kcal:  1500-1700kcal/day  Protein:  75-85g/day  Fluid:  >1.5L/day  02/20/20 MS, RD, LDN Contact information available in Amion

## 2020-02-18 NOTE — ED Notes (Signed)
This RN to bedside at this time, pt resting in bed. Meds administered per MD order. Pt's daughter at bedside at this time. Updated patient and daughter. Pt visualized in NAD at this time.

## 2020-02-18 NOTE — ED Notes (Signed)
Admitting MD at bedside at this time. Pt's daughter remains at bedside at this time.

## 2020-02-18 NOTE — ED Notes (Signed)
This RN to bedside, pt assisted to the bathroom to have a BM. No change in patient condition at this time. Pt currently sitting on toilet attempting to have a BM at this time.

## 2020-02-18 NOTE — ED Triage Notes (Signed)
Police were called to [employer] parking lot.  They said patient was acting odd.  She is alert and ambulatory and pleasant.

## 2020-02-18 NOTE — ED Notes (Signed)
Attempted to call report

## 2020-02-18 NOTE — Consult Note (Signed)
Reason for Consult:AMS Requesting Physician: Dhungel  CC: AMS  I have been asked by Dr. Clementeen Graham to see this patient in consultation for AMS.  HPI: Sheila Bowman is an 69 y.o. female who is unable to provide a reliable history.  Circumstances surrounding her admission have changed frequently during this encounter.  From the chart it appears was brought in by police after being found at [facility] confused this morning.  Patient with a history of ongoing alcohol use with cryptogenic cirrhosis, chronic diastolic CHF, hypertension, diet-controlled diabetes, ADHD.    Per daughter for past 8-9 months patient has been persistently getting confused, drinks regularly and drives.  She has poor sleep and has hallucinates seeing and hearing people and also gets easily distracted and confused, fills up her thoughts with unreliable stories.  She has been found trespassing in the neighborhood, daughter also concerned about falls at home and occasionally swirling feces. She was seen in the ED about 8 months back with problems with alcohol, hearing voices.  She was discharged from the ED and instructed to follow-up with her PCP.  She was seen by her PCP in September 2020, no intervention regarding her current problems were done.  She was referred to hepatology for her cirrhosis but does not follow regularly.  Past Medical History:  Diagnosis Date  . Diabetes mellitus without complication ([place])   . Hypertension     History reviewed. No pertinent surgical history.  Family history: Reports mother dies of old age and father died of CAD.  No family history of HD  Social History:  reports that she has quit smoking. She has never used smokeless tobacco. She reports current alcohol use. She reports that she does not use drugs.  No Known Allergies  Medications:  I have reviewed the patient's current medications. Prior to Admission:  Prior to Admission medications   Medication Sig Start Date End Date Taking?  Authorizing Provider  hydrochlorothiazide (HYDRODIURIL) 12.5 MG tablet Take 12.5 mg by mouth daily. 05/27/19 05/26/20 Yes [provider]  methylphenidate 36 MG PO CR tablet Take 36 mg by mouth every morning. 09/25/19  Yes [provider]  olmesartan (BENICAR) 40 MG tablet Take 40 mg by mouth daily. 05/27/19 05/26/20 Yes [provider]  potassium chloride (KLOR-CON) 10 MEQ tablet Take 10 mEq by mouth daily. 10/13/19  Yes [provider]     Scheduled: . [START ON 05/02/8881] folic acid  1 mg Oral Daily  . hydrochlorothiazide  12.5 mg Oral Daily  . insulin aspart  0-5 Units Subcutaneous QHS  . insulin aspart  0-9 Units Subcutaneous TID WC  . irbesartan  300 mg Oral Daily  . LORazepam  0-4 mg Intravenous Q6H   Or  . LORazepam  0-4 mg Oral Q6H  . [START ON 02/20/2020] LORazepam  0-4 mg Intravenous Q12H   Or  . [START ON 02/20/2020] LORazepam  0-4 mg Oral Q12H  . multivitamin with minerals  1 tablet Oral Daily  . potassium chloride  40 mEq Oral Daily  . sodium chloride flush  3 mL Intravenous Q12H    ROS: History obtained from patient and daughter  General ROS: negative for - chills, fatigue, fever, night sweats, weight gain or weight loss Psychological ROS: as noted in HPI Ophthalmic ROS: negative for - blurry vision, double vision, eye pain or loss of vision ENT ROS: negative for - epistaxis, nasal discharge, oral lesions, sore throat, tinnitus or vertigo Allergy and Immunology ROS: negative for - hives or itchy/watery  eyes Hematological and Lymphatic ROS: negative for - bleeding problems, bruising or swollen lymph nodes Endocrine ROS: negative for - galactorrhea, hair pattern changes, polydipsia/polyuria or temperature intolerance Respiratory ROS: negative for - cough, hemoptysis, shortness of breath or wheezing Cardiovascular ROS: negative for - chest pain, dyspnea on exertion, edema or irregular heartbeat Gastrointestinal ROS: negative for -  abdominal pain, diarrhea, hematemesis, nausea/vomiting or stool incontinence Genito-Urinary ROS: negative for - dysuria, hematuria, incontinence or urinary frequency/urgency Musculoskeletal ROS: RLE swelling Neurological ROS: as noted in HPI Dermatological ROS: negative for rash and skin lesion changes  Physical Examination: Blood pressure (!) 113/56, pulse 74, temperature 98.4 F (36.9 C), temperature source Oral, resp. rate 14, height '5\' 5"'$  (1.651 m), weight 56.7 kg, SpO2 96 %.  HEENT-  Normocephalic, no lesions, without obvious abnormality.  Normal external eye and conjunctiva.  Normal TM's bilaterally.  Normal auditory canals and external ears. Normal external nose, mucus membranes and septum.  Normal pharynx. Cardiovascular- S1, S2 normal, pulses palpable throughout   Lungs- chest clear, no wheezing, rales, normal symmetric air entry Abdomen- soft, non-tender; bowel sounds normal; no masses,  no organomegaly Extremities- RLE swelling Lymph-no adenopathy palpable Musculoskeletal-no joint tenderness, deformity or swelling Skin-warm and dry, no hyperpigmentation, vitiligo, or suspicious lesions  Neurological Examination   Mental Status: Alert, confabulatory and often contradictory with details.  Speech fluent without evidence of aphasia.  Able to follow 3 step commands without difficulty. Cranial Nerves: II: Discs flat bilaterally; Visual fields grossly normal, pupils equal, round, reactive to light and accommodation III,IV, VI: ptosis not present, extra-ocular motions intact bilaterally V,VII: smile symmetric, facial light touch sensation normal bilaterally VIII: hearing normal bilaterally IX,X: gag reflex present XI: bilateral shoulder shrug XII: midline tongue extension Motor: Right : Upper extremity   5/5    Left:     Upper extremity   5/5  Lower extremity   5/5     Lower extremity   5/5 Tone and bulk:normal tone throughout; no atrophy noted.  Constant choreiform movements  noted. Sensory: Pinprick and light touch intact throughout, bilaterally Deep Tendon Reflexes: Symmetric throughout Plantars: Right: upgoing   Left: upgoing Cerebellar: Normal finger-to-nose and normal heel-to-shin testing bilaterally Gait: not tested due to safety concerns    Laboratory Studies:   Basic Metabolic Panel: Recent Labs  Lab 02/18/20 0739 02/18/20 0745  NA 145  --   K 3.2*  --   CL 111  --   CO2 26  --   GLUCOSE 85  --   BUN 13  --   CREATININE 0.66  --   CALCIUM 8.5*  --   MG  --  1.9  PHOS  --  3.6    Liver Function Tests: Recent Labs  Lab 02/18/20 0739  AST 87*  ALT 32  ALKPHOS 94  BILITOT 5.2*  PROT 6.6  ALBUMIN 3.2*   No results for input(s): LIPASE, AMYLASE in the last 168 hours. Recent Labs  Lab 02/18/20 0800  AMMONIA 48*    CBC: Recent Labs  Lab 02/18/20 0739  WBC 4.3  HGB 9.1*  HCT 28.3*  MCV 99.3  PLT 56*    Cardiac Enzymes: No results for input(s): CKTOTAL, CKMB, CKMBINDEX, TROPONINI in the last 168 hours.  BNP: Invalid input(s): POCBNP  CBG: Recent Labs  Lab 02/18/20 0740  GLUCAP 42    Microbiology: Results for orders placed or performed during the hospital encounter of 02/18/20  Respiratory Panel by RT PCR (Flu A&B, Covid) - Nasopharyngeal Swab  Status: None   Collection Time: 02/18/20 10:01 AM   Specimen: Nasopharyngeal Swab  Result Value Ref [place] Status   SARS Coronavirus 2 by RT PCR NEGATIVE NEGATIVE Final    Comment: (NOTE) SARS-CoV-2 target nucleic acids are NOT DETECTED. The SARS-CoV-2 RNA is generally detectable in upper respiratoy specimens during the acute phase of infection. The lowest concentration of SARS-CoV-2 viral copies this assay can detect is 131 copies/mL. A negative result does not preclude SARS-Cov-2 infection and should not be used as the sole basis for treatment or other patient management decisions. A negative result may occur with  improper specimen collection/handling,  submission of specimen other than nasopharyngeal swab, presence of viral mutation(s) within the areas targeted by this assay, and inadequate number of viral copies (<131 copies/mL). A negative result must be combined with clinical observations, patient history, and epidemiological information. The expected result is Negative. Fact Sheet for Patients:  PinkCheek.be Fact Sheet for Healthcare Providers:  GravelBags.it This test is not yet ap proved or cleared by the [place] FDA and  has been authorized for detection and/or diagnosis of SARS-CoV-2 by FDA under an Emergency Use Authorization (EUA). This EUA will remain  in effect (meaning this test can be used) for the duration of the COVID-19 declaration under Section 564(b)(1) of the Act, 21 U.S.C. section 360bbb-3(b)(1), unless the authorization is terminated or revoked sooner.    Influenza A by PCR NEGATIVE NEGATIVE Final   Influenza B by PCR NEGATIVE NEGATIVE Final    Comment: (NOTE) The Xpert Xpress SARS-CoV-2/FLU/RSV assay is intended as an aid in  the diagnosis of influenza from Nasopharyngeal swab specimens and  should not be used as a sole basis for treatment. Nasal washings and  aspirates are unacceptable for Xpert Xpress SARS-CoV-2/FLU/RSV  testing. Fact Sheet for Patients: PinkCheek.be Fact Sheet for Healthcare Providers: GravelBags.it This test is not yet approved or cleared by the [place] FDA and  has been authorized for detection and/or diagnosis of SARS-CoV-2 by  FDA under an Emergency Use Authorization (EUA). This EUA will remain  in effect (meaning this test can be used) for the duration of the  Covid-19 declaration under Section 564(b)(1) of the Act, 21  U.S.C. section 360bbb-3(b)(1), unless the authorization is  terminated or revoked. Performed at [facility], [address] [place], [place] [address]     Coagulation Studies: No results for input(s): LABPROT, INR in the last 72 hours.  Urinalysis:  Recent Labs  Lab 02/18/20 1001  COLORURINE AMBER*  LABSPEC 1.020  PHURINE 6.0  GLUCOSEU NEGATIVE  HGBUR SMALL*  BILIRUBINUR NEGATIVE  KETONESUR NEGATIVE  PROTEINUR 30*  NITRITE POSITIVE*  LEUKOCYTESUR SMALL*    Lipid Panel:  No results found for: CHOL, TRIG, HDL, CHOLHDL, VLDL, LDLCALC  HgbA1C:  Lab Results  Component Value Date   HGBA1C 4.7 (L) 02/18/2020    Urine Drug Screen:      Component Value Date/Time   LABOPIA NONE DETECTED 02/18/2020 1001   COCAINSCRNUR NONE DETECTED 02/18/2020 1001   LABBENZ NONE DETECTED 02/18/2020 1001   AMPHETMU NONE DETECTED 02/18/2020 1001   THCU NONE DETECTED 02/18/2020 1001   LABBARB NONE DETECTED 02/18/2020 1001    Alcohol Level:  Recent Labs  Lab 02/18/20 0745  ETH 91*    Other results: EKG: sinus rhythm at 87 bpm with premature supraventricular complexes.  Imaging: CT Head Wo Contrast  Result Date: 02/18/2020 CLINICAL DATA:  Delirium.  Additional history provided: EXAM: CT HEAD WITHOUT CONTRAST TECHNIQUE:  Contiguous axial images were obtained from the base of the skull through the vertex without intravenous contrast. COMPARISON:  Head CT 06/09/2019. FINDINGS: Brain: The examination is mildly motion degraded. No evidence of acute intracranial hemorrhage. No demarcated cortical infarction. No evidence of intracranial mass. No midline shift or extra-axial fluid collection. Mild scattered ill-defined hypoattenuation within the cerebral white matter is nonspecific, but consistent with chronic small vessel ischemic disease and similar to prior exam 06/09/2019. Stable, moderate generalized parenchymal atrophy, stable. Vascular: No hyperdense vessel. Skull: Normal. Negative for fracture or focal lesion. Sinuses/Orbits: Visualized orbits demonstrate no acute abnormality. No significant paranasal sinus disease  or mastoid effusion at the imaged levels. IMPRESSION: Mildly motion degraded examination. No evidence of acute intracranial abnormality. Moderate generalized parenchymal atrophy with mild chronic small vessel ischemic disease, similar to prior examination 06/09/2019. Electronically Signed   By: Kellie Simmering DO   On: 02/18/2020 08:49     Assessment/Plan: 69 year old female presenting with AMS and falls.  On neurological examination patient confabulatory and with choreiform movements and upgoing toes bilaterally. Head CT personally reviewed and shows no acute changes with evidence of chronic small vessel ischemic changes. differential for choreiform movements is quite extensive and includes hepatic failure.  LFT's and ammonia elevated both of which may be contributing to chorea and altered mental status since patient has not been keeping up with follow up and continuing to consume ETOH. TSH, B12, Magnesium are normal.  Will rule out other possible contributing factors as well.  Recommendations: 1. MRI of the brain with and without contrast 2. EEG 3. B1, heavy metal screen, serum copper, antiphospholipid antibody, ESR, CRP 4. Agree with treating elevated ammonia  5. D/C Methylphenidate  Alexis Goodell, MD Neurology [phone] 02/18/2020, 4:07 PM

## 2020-02-18 NOTE — ED Provider Notes (Signed)
[facility] Emergency Department Provider Note  ____________________________________________   First MD Initiated Contact with Patient 02/18/20 [phone]     (approximate)  I have reviewed the triage vital signs and the nursing notes.   HISTORY  Chief Complaint Altered Mental Status    HPI Sheila Bowman is a 69 y.o. female  Here with altered mental status. Pt presents via EMS. SHe reportedly was found in the [employer] parking lot, in her car, confused. [employer] was closed at that time. She was confused, providing unclear history at the time so was brought to the ED. Per records, pt has h/o cirrhosis, prior EtOH abuse though reportedly has stopped drinking now. On my assessment, pt tells me that she was supposed to be meeting family at [facility] this morning. Unclear if this is true. She is somewhat confused and evasive, however, with tangential thinking. Denies any HA or other complaints. No double vision.  Level 5 caveat invoked as remainder of history, ROS, and physical exam limited due to patient's confusion.         Past Medical History:  Diagnosis Date  . Diabetes mellitus without complication (HCC)   . Hypertension     There are no problems to display for this patient.   History reviewed. No pertinent surgical history.  Prior to Admission medications   Medication Sig Start Date End Date Taking? Authorizing Provider  hydrochlorothiazide (HYDRODIURIL) 12.5 MG tablet Take 12.5 mg by mouth daily. 05/27/19 05/26/20 Yes [provider]  methylphenidate 36 MG PO CR tablet Take 36 mg by mouth every morning. 09/25/19  Yes [provider]  olmesartan (BENICAR) 40 MG tablet Take 40 mg by mouth daily. 05/27/19 05/26/20 Yes [provider]  potassium chloride (KLOR-CON) 10 MEQ tablet Take 10 mEq by mouth daily. 10/13/19  Yes [provider]    Allergies Patient has no known allergies.  No family history on file.  Social  History Social History   Tobacco Use  . Smoking status: Former Games developer  . Smokeless tobacco: Never Used  Substance Use Topics  . Alcohol use: Yes    Comment: daily  . Drug use: Never    Review of Systems  Review of Systems  Unable to perform ROS: Mental status change  Constitutional: Positive for fatigue.  Psychiatric/Behavioral: Positive for confusion.     ____________________________________________  PHYSICAL EXAM:      VITAL SIGNS: ED Triage Vitals  Enc Vitals Group     BP 02/18/20 0732 (!) 154/61     Pulse Rate 02/18/20 0732 94     Resp 02/18/20 0732 16     Temp 02/18/20 0732 98.4 F (36.9 C)     Temp Source 02/18/20 0732 Oral     SpO2 02/18/20 0732 100 %     Weight 02/18/20 0733 125 lb (56.7 kg)     Height 02/18/20 0733 5\' 5"  (1.651 m)     Head Circumference --      Peak Flow --      Pain Score 02/18/20 0733 0     Pain Loc --      Pain Edu? --      Excl. in GC? --      Physical Exam Vitals and nursing note reviewed.  Constitutional:      General: She is not in acute distress.    Appearance: She is well-developed.  HENT:     Head: Normocephalic and atraumatic.  Eyes:     Conjunctiva/sclera: Conjunctivae normal.  Comments: No nystagmus, no opthalmoplegia  Cardiovascular:     Rate and Rhythm: Normal rate and regular rhythm.     Heart sounds: Normal heart sounds.  Pulmonary:     Effort: Pulmonary effort is normal. No respiratory distress.     Breath sounds: No wheezing.  Abdominal:     General: There is no distension.  Musculoskeletal:     Cervical back: Neck supple.  Skin:    General: Skin is warm.     Capillary Refill: Capillary refill takes less than 2 seconds.     Findings: No rash.  Neurological:     Mental Status: She is alert and oriented to person, place, and time.     GCS: GCS eye subscore is 4. GCS verbal subscore is 4. GCS motor subscore is 6.     Cranial Nerves: Cranial nerves are intact.     Sensory: Sensation is intact.      Motor: Motor function is intact. No abnormal muscle tone.     Comments: No overt asterixis or tremor.       ____________________________________________   LABS (all labs ordered are listed, but only abnormal results are displayed)  Labs Reviewed  COMPREHENSIVE METABOLIC PANEL - Abnormal; Notable for the following components:      Result Value   Potassium 3.2 (*)    Calcium 8.5 (*)    Albumin 3.2 (*)    AST 87 (*)    Total Bilirubin 5.2 (*)    All other components within normal limits  CBC - Abnormal; Notable for the following components:   RBC 2.85 (*)    Hemoglobin 9.1 (*)    HCT 28.3 (*)    Platelets 56 (*)    All other components within normal limits  ETHANOL - Abnormal; Notable for the following components:   Alcohol, Ethyl (B) 91 (*)    All other components within normal limits  AMMONIA - Abnormal; Notable for the following components:   Ammonia 48 (*)    All other components within normal limits  RESPIRATORY PANEL BY RT PCR (FLU A&B, COVID)  GLUCOSE, CAPILLARY  URINE DRUG SCREEN, QUALITATIVE ([facility] ONLY)  URINALYSIS, COMPLETE (UACMP) WITH MICROSCOPIC  CBG MONITORING, ED    ____________________________________________  EKG: Normal sinus rhythm, ventricular rate 87.  PR 184, QRS 90, QTc 478.  No acute ST elevations or depressions.  Nonspecific T wave changes, no evidence of acute infarct. ________________________________________  RADIOLOGY All imaging, including plain films, CT scans, and ultrasounds, independently reviewed by me, and interpretations confirmed via formal radiology reads.  ED MD interpretation:   CT head: No acute abnormality, chronic small vessel disease  Official radiology report(s): CT Head Wo Contrast  Result Date: 02/18/2020 CLINICAL DATA:  Delirium.  Additional history provided: EXAM: CT HEAD WITHOUT CONTRAST TECHNIQUE: Contiguous axial images were obtained from the base of the skull through the vertex without intravenous contrast.  COMPARISON:  Head CT 06/09/2019. FINDINGS: Brain: The examination is mildly motion degraded. No evidence of acute intracranial hemorrhage. No demarcated cortical infarction. No evidence of intracranial mass. No midline shift or extra-axial fluid collection. Mild scattered ill-defined hypoattenuation within the cerebral white matter is nonspecific, but consistent with chronic small vessel ischemic disease and similar to prior exam 06/09/2019. Stable, moderate generalized parenchymal atrophy, stable. Vascular: No hyperdense vessel. Skull: Normal. Negative for fracture or focal lesion. Sinuses/Orbits: Visualized orbits demonstrate no acute abnormality. No significant paranasal sinus disease or mastoid effusion at the imaged levels. IMPRESSION: Mildly motion degraded examination. No  evidence of acute intracranial abnormality. Moderate generalized parenchymal atrophy with mild chronic small vessel ischemic disease, similar to prior examination 06/09/2019. Electronically Signed   By: Jackey Loge DO   On: 02/18/2020 08:49    ____________________________________________  PROCEDURES   Procedure(s) performed (including Critical Care):  Procedures  ____________________________________________  INITIAL IMPRESSION / MDM / ASSESSMENT AND PLAN / ED COURSE  As part of my medical decision making, I reviewed the following data within the electronic MEDICAL RECORD NUMBER Nursing notes reviewed and incorporated, Old chart reviewed, Notes from prior ED visits, and [place] Controlled Substance Database       *Sahra Koren was evaluated in Emergency Department on 02/18/2020 for the symptoms described in the history of present illness. She was evaluated in the context of the global COVID-19 pandemic, which necessitated consideration that the patient might be at risk for infection with the SARS-CoV-2 virus that causes COVID-19. Institutional protocols and algorithms that pertain to the evaluation of patients at risk for  COVID-19 are in a state of rapid change based on information released by regulatory bodies including the CDC and federal and state organizations. These policies and algorithms were followed during the patient's care in the ED.  Some ED evaluations and interventions may be delayed as a result of limited staffing during the pandemic.*     Medical Decision Making: 69 year old female with history of chronic alcohol dependence and cirrhosis here with altered mental status.  While ammonia is mildly elevated, her history is actually more concerning for possible Warnicke's encephalopathy.  She has had hallucinations, altered mental status, memory loss, gait difficulties, and occasional vision changes.  She told her daughter she was no longer drinking but alcohol is positive here.  She has no focal neurological deficits.  CT head is negative for subdural or other acute abnormality.  Her labs show likely chronic transaminitis as well as thrombocytopenia secondary to her cirrhosis.  Given her profound confusion and worsening mental status, feel it is reasonable to start her on IV thiamine and folic acid replacement, as well as consideration of lactulose.  Admit to medicine.  ____________________________________________  FINAL CLINICAL IMPRESSION(S) / ED DIAGNOSES  Final diagnoses:  Encephalopathy  Confusion     MEDICATIONS GIVEN DURING THIS VISIT:  Medications  LORazepam (ATIVAN) injection 0-4 mg (has no administration in time Lepp)    Or  LORazepam (ATIVAN) tablet 0-4 mg (has no administration in time [place]coat)  LORazepam (ATIVAN) injection 0-4 mg (has no administration in time Calvey)    Or  LORazepam (ATIVAN) tablet 0-4 mg (has no administration in time Hession)  thiamine (B-1) injection 400 mg (has no administration in time Routon)  folic acid 1 mg in sodium chloride 0.9 % 50 mL IVPB (has no administration in time Anastacio)  thiamine (B-1) injection 100 mg (100 mg Intravenous Given 02/18/20 0835)      ED Discharge Orders    None       Note:  This document was prepared using Dragon voice recognition software and may include unintentional dictation errors.   Shaune Pollack, MD 02/18/20 [phone]

## 2020-02-19 ENCOUNTER — Inpatient Hospital Stay: Payer: Medicare Other

## 2020-02-19 DIAGNOSIS — Z23 Encounter for immunization: Secondary | ICD-10-CM | POA: Diagnosis present

## 2020-02-19 DIAGNOSIS — F10230 Alcohol dependence with withdrawal, uncomplicated: Secondary | ICD-10-CM | POA: Diagnosis not present

## 2020-02-19 DIAGNOSIS — E119 Type 2 diabetes mellitus without complications: Secondary | ICD-10-CM

## 2020-02-19 DIAGNOSIS — Z79899 Other long term (current) drug therapy: Secondary | ICD-10-CM | POA: Diagnosis not present

## 2020-02-19 DIAGNOSIS — R410 Disorientation, unspecified: Secondary | ICD-10-CM | POA: Diagnosis present

## 2020-02-19 DIAGNOSIS — G9341 Metabolic encephalopathy: Secondary | ICD-10-CM | POA: Diagnosis present

## 2020-02-19 DIAGNOSIS — Z87891 Personal history of nicotine dependence: Secondary | ICD-10-CM | POA: Diagnosis not present

## 2020-02-19 DIAGNOSIS — D696 Thrombocytopenia, unspecified: Secondary | ICD-10-CM | POA: Diagnosis present

## 2020-02-19 DIAGNOSIS — F909 Attention-deficit hyperactivity disorder, unspecified type: Secondary | ICD-10-CM | POA: Diagnosis present

## 2020-02-19 DIAGNOSIS — I110 Hypertensive heart disease with heart failure: Secondary | ICD-10-CM | POA: Diagnosis present

## 2020-02-19 DIAGNOSIS — E876 Hypokalemia: Secondary | ICD-10-CM | POA: Diagnosis present

## 2020-02-19 DIAGNOSIS — Z20822 Contact with and (suspected) exposure to covid-19: Secondary | ICD-10-CM | POA: Diagnosis present

## 2020-02-19 DIAGNOSIS — K7030 Alcoholic cirrhosis of liver without ascites: Secondary | ICD-10-CM | POA: Diagnosis present

## 2020-02-19 DIAGNOSIS — R4182 Altered mental status, unspecified: Secondary | ICD-10-CM

## 2020-02-19 DIAGNOSIS — I5032 Chronic diastolic (congestive) heart failure: Secondary | ICD-10-CM | POA: Diagnosis present

## 2020-02-19 DIAGNOSIS — E11649 Type 2 diabetes mellitus with hypoglycemia without coma: Secondary | ICD-10-CM | POA: Diagnosis not present

## 2020-02-19 DIAGNOSIS — I10 Essential (primary) hypertension: Secondary | ICD-10-CM | POA: Diagnosis not present

## 2020-02-19 DIAGNOSIS — D638 Anemia in other chronic diseases classified elsewhere: Secondary | ICD-10-CM | POA: Diagnosis present

## 2020-02-19 DIAGNOSIS — R296 Repeated falls: Secondary | ICD-10-CM | POA: Diagnosis present

## 2020-02-19 DIAGNOSIS — F10251 Alcohol dependence with alcohol-induced psychotic disorder with hallucinations: Secondary | ICD-10-CM | POA: Diagnosis present

## 2020-02-19 DIAGNOSIS — Z66 Do not resuscitate: Secondary | ICD-10-CM | POA: Diagnosis present

## 2020-02-19 DIAGNOSIS — F10239 Alcohol dependence with withdrawal, unspecified: Secondary | ICD-10-CM | POA: Diagnosis not present

## 2020-02-19 DIAGNOSIS — R627 Adult failure to thrive: Secondary | ICD-10-CM | POA: Diagnosis present

## 2020-02-19 DIAGNOSIS — Z6822 Body mass index (BMI) 22.0-22.9, adult: Secondary | ICD-10-CM | POA: Diagnosis not present

## 2020-02-19 DIAGNOSIS — Y904 Blood alcohol level of 80-99 mg/100 ml: Secondary | ICD-10-CM | POA: Diagnosis present

## 2020-02-19 DIAGNOSIS — Z7189 Other specified counseling: Secondary | ICD-10-CM | POA: Diagnosis not present

## 2020-02-19 DIAGNOSIS — Z8249 Family history of ischemic heart disease and other diseases of the circulatory system: Secondary | ICD-10-CM | POA: Diagnosis not present

## 2020-02-19 DIAGNOSIS — E878 Other disorders of electrolyte and fluid balance, not elsewhere classified: Secondary | ICD-10-CM | POA: Diagnosis present

## 2020-02-19 DIAGNOSIS — F1019 Alcohol abuse with unspecified alcohol-induced disorder: Secondary | ICD-10-CM | POA: Diagnosis not present

## 2020-02-19 LAB — COMPREHENSIVE METABOLIC PANEL
ALT: 29 U/L (ref 0–44)
AST: 78 U/L — ABNORMAL HIGH (ref 15–41)
Albumin: 2.8 g/dL — ABNORMAL LOW (ref 3.5–5.0)
Alkaline Phosphatase: 82 U/L (ref 38–126)
Anion gap: 7 (ref 5–15)
BUN: 12 mg/dL (ref 8–23)
CO2: 23 mmol/L (ref 22–32)
Calcium: 8.3 mg/dL — ABNORMAL LOW (ref 8.9–10.3)
Chloride: 111 mmol/L (ref 98–111)
Creatinine, Ser: 0.63 mg/dL (ref 0.44–1.00)
GFR calc Af Amer: >60 mL/min (ref >60)
GFR calc non Af Amer: >60 mL/min (ref >60)
Glucose, Bld: 66 mg/dL — ABNORMAL LOW (ref 70–99)
Potassium: 3.3 mmol/L — ABNORMAL LOW (ref 3.5–5.1)
Sodium: 141 mmol/L (ref 135–145)
Total Bilirubin: 5.8 mg/dL — ABNORMAL HIGH (ref 0.3–1.2)
Total Protein: 5.5 g/dL — ABNORMAL LOW (ref 6.5–8.1)

## 2020-02-19 LAB — GLUCOSE, CAPILLARY
Glucose-Capillary: 102 mg/dL — ABNORMAL HIGH (ref 70–99)
Glucose-Capillary: 138 mg/dL — ABNORMAL HIGH (ref 70–99)
Glucose-Capillary: 82 mg/dL (ref 70–99)
Glucose-Capillary: 124 mg/dL — ABNORMAL HIGH (ref 70–99)

## 2020-02-19 LAB — CBC
HCT: 27.7 % — ABNORMAL LOW (ref 36.0–46.0)
Hemoglobin: 8.9 g/dL — ABNORMAL LOW (ref 12.0–15.0)
MCH: 32.1 pg (ref 26.0–34.0)
MCHC: 32.1 g/dL (ref 30.0–36.0)
MCV: 100 fL (ref 80.0–100.0)
Platelets: 46 10*3/uL — ABNORMAL LOW (ref 150–400)
RBC: 2.77 MIL/uL — ABNORMAL LOW (ref 3.87–5.11)
RDW: 14.2 % (ref 11.5–15.5)
WBC: 4.5 10*3/uL (ref 4.0–10.5)
nRBC: 0 % (ref 0.0–0.2)

## 2020-02-19 LAB — PROTIME-INR
INR: 1.9 — ABNORMAL HIGH (ref 0.8–1.2)
Prothrombin Time: 21.7 seconds — ABNORMAL HIGH (ref 11.4–15.2)

## 2020-02-19 MED ORDER — LACTULOSE 10 GM/15ML PO SOLN
10.0000 g | Freq: Every day | ORAL | Status: DC
  Administered 2020-02-19: 17:00:00 10 g via ORAL
  Filled 2020-02-19: qty 30

## 2020-02-19 MED ORDER — THIAMINE HCL 100 MG/ML IJ SOLN
500.0000 mg | Freq: Three times a day (TID) | INTRAVENOUS | Status: DC
  Administered 2020-02-19 – 2020-02-22 (×9): 500 mg via INTRAVENOUS
  Filled 2020-02-19: qty 2
  Filled 2020-02-19 (×10): qty 5

## 2020-02-19 MED ORDER — GADOBUTROL 1 MMOL/ML IV SOLN
6.0000 mL | Freq: Once | INTRAVENOUS | Status: AC | PRN
  Administered 2020-02-19: 14:00:00 6 mL via INTRAVENOUS

## 2020-02-19 NOTE — Progress Notes (Signed)
eeg completed ° °

## 2020-02-19 NOTE — Progress Notes (Signed)
CH consulted w/RN re: OR for AD for pt.'s dtr. to complete.  Pt. asleep; RN and chart notes indicate pt. is not appropriate to complete paperwork in current state of confusion.  CH explained dtr. cannot complete AD for pt. --> pt. must be competent to sign AD.  CH left copy of AD for dtr.'s perusal.  A follow-up w/dtr. would likely be helpful.     02/18/20 2030  Clinical Encounter Type  Visited With Patient not available;Health care provider  Visit Type Initial  Referral From Nurse  Consult/Referral To Chaplain  Recommendations follow up w/dtr. re: AD

## 2020-02-19 NOTE — Progress Notes (Signed)
PROGRESS NOTE  Sheila Bowman IEP:[id number] DOB: 12-04-1951 DOA: 02/18/2020 PCP: [facility] [place]  Brief History   69 year old woman PMH of alcohol abuse, cirrhosis, found in parking lot confused.  ED.  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  Serum alcohol was positive although patient denied intake.  No focal neurologic deficits were noted.  CT head was negative.  A & P  Acute on chronic metabolic encephalopathy, daily alcohol abuse, reported longstanding hallucinations, confusion concerning for  Wernicke-Korsakoff.  CT head no acute abnormalities. --Treat with high-dose IV thiamine for 3 days, hydration, multivitamin and folate. --TSH, B12, ESR, CRP within normal limits. --Neurology and psychiatry consulted for further recommendations and for capacity assessment.  Follow-up MRI brain, EEG, B1, heavy metal screen, serum copper --Treat hyperammonemia --TOC consulted, patient lives alone although daughter checks on her  Alcohol abuse --Monitor for withdrawal.  CIWA --To or for refeeding syndrome, potassium magnesium and phosphorus daily  Alcoholic cirrhosis without ascites, with hyperammonemia, thrombocytopenia.  Possible alcoholic hepatitis.  Unclear whether hyperammonemia is contributing. --Lactulose.  Ammonia level only modestly elevated at 48, I doubt this is contributing.  Recheck serum ammonia in the morning. --Has been referred in the past to the liver clinic but has not followed up. --Check INR to calculate discriminant function  Anemia of chronic disease, stable  Diabetes mellitus type 2 diet controlled --Stable.  Essential hypertension --Stable.  Continue hydrochlorothiazide therapy  ADD. On methylphenidate as an outpatient, daughter concerned that patient might be over taking this medication --Methylphenidate on hold  Disposition Plan: Unclear at this point.  Patient  lives alone, has been driving and providing self-care.  Many symptoms seem to be longstanding but is not clear that she will be able to take care of herself in the outpatient setting in the future.  Await psychiatry evaluation to assess capacity, further investigation by TOC.  Follow-up MRI brain and multiple laboratory studies still pending and will be followed up either inpatient or outpatient.  Continue to monitor for withdrawal and treat hyperammonemia.  DVT prophylaxis: SCDs Code Status: Full Family Communication: Discussed in detail with daughter at bedside.  She will take the patient home with her on discharge.  She has been suggested in pursuing assisted living, however there is no formal power of attorney     Murray Hodgkins, MD  [facility] Direct contact: see [email or web address] (further directions at bottom of note if needed) 7PM-7AM contact night coverage as at bottom of note 02/19/2020, 8:57 AM  LOS: 0 days   Significant Hospital Events    2/18 admitted for metabolic encephalopathy  6/60 psychiatry and neurology consultations   Consults:   Psychiatry  Neurology   Procedures:     Significant Diagnostic Tests:      Micro Data:   SARS-CoV-2, influenza negative   Antimicrobials:     Interval History/Subjective  No complaints, seems to feel well, eating breakfast now.  Denies hallucinations.  Objective   Vitals:  Vitals:   02/19/20 0420 02/19/20 0826  BP: (!) 141/85 131/73  Pulse: 95 84  Resp: 19 18  Temp: 98.3 F (36.8 C) 97.9 F (36.6 C)  SpO2: 100% 98%    Exam:  Constitutional.  Appears calm, comfortable, eating breakfast. Respiratory.  Clear to auscultation bilaterally.  No wheezes, rales or rhonchi.  Normal respiratory effort. Cardiovascular.  Regular rate and rhythm.  No murmur, rub or gallop. Psychiatric.  Oriented to self, location, month, year, day.  Does tell  a strange story about doing the wash for of some kind of social program.  Not  clear that this is true.  I have personally reviewed the following:   Today's Data   CBG stable  Potassium 3.3  Total bilirubin 5.8, AST 78  Platelets 46, hemoglobin 8.9  Scheduled Meds:  feeding supplement (ENSURE ENLIVE)  237 mL Oral BID BM   folic acid  1 mg Oral Daily   hydrochlorothiazide  12.5 mg Oral Daily   influenza vaccine adjuvanted  0.5 mL Intramuscular Tomorrow-1000   insulin aspart  0-5 Units Subcutaneous QHS   insulin aspart  0-9 Units Subcutaneous TID WC   irbesartan  300 mg Oral Daily   LORazepam  0-4 mg Intravenous Q6H   Or   LORazepam  0-4 mg Oral Q6H   [START ON 02/20/2020] LORazepam  0-4 mg Intravenous Q12H   Or   [START ON 02/20/2020] LORazepam  0-4 mg Oral Q12H   multivitamin with minerals  1 tablet Oral Daily   potassium chloride  40 mEq Oral Daily   sodium chloride flush  3 mL Intravenous Q12H   Continuous Infusions:  thiamine injection      Active Problems:   Acute metabolic encephalopathy   Alcoholic cirrhosis of liver without ascites (HCC)   Alcohol abuse with alcohol-induced disorder (HCC)   Hypokalemia   Type 2 diabetes mellitus without complication ([place])   Essential hypertension   LOS: 0 days   How to contact the [facility] Attending or Consulting provider 7A - 7P or covering provider during after hours [place], for this patient?  1. Check the care team in [facility] and look for a) attending/consulting TRH provider listed and b) the [facility] team listed 2. Log into [email or web address] and use [place]'s universal password to access. If you do not have the password, please contact the hospital operator. 3. Locate the [facility] provider you are looking for under [facility] and page to a number that you can be directly reached. 4. If you still have difficulty reaching the provider, please page the [facility] (Director on Call) for the Hospitalists listed on amion for assistance.

## 2020-02-19 NOTE — Progress Notes (Signed)
Subjective: Patient stable overnight.  No new neurological complaints.     Objective: Current vital signs: BP 131/73 (BP Location: Right Arm)   Pulse 84   Temp 97.9 F (36.6 C) (Oral)   Resp 18   Ht '5\' 6"'$  (1.676 m)   Wt 62.3 kg   SpO2 98%   BMI 22.17 kg/m  Vital signs in last 24 hours: Temp:  [97.9 F (36.6 C)-98.5 F (36.9 C)] 97.9 F (36.6 C) (02/19 0826) Pulse Rate:  [74-98] 84 (02/19 0826) Resp:  [13-21] 18 (02/19 0826) BP: (113-162)/(46-96) 131/73 (02/19 0826) SpO2:  [90 %-100 %] 98 % (02/19 0826) Weight:  [62.3 kg] 62.3 kg (02/18 1700)  Intake/Output from previous day: 02/18 0701 - 02/19 0700 In: 53 [I.V.:3; IV Piggyback:50] Out: 0  Intake/Output this shift: No intake/output data recorded. Nutritional status:  Diet Order            Diet regular Room service appropriate? Yes; Fluid consistency: Thin  Diet effective now              Neurologic Exam: Mental Status: Alert, confabulatory and often contradictory with details.  Speech fluent without evidence of aphasia.  Able to follow 3 step commands without difficulty. Cranial Nerves: II: Visual fields grossly normal, pupils equal, round, reactive to light and accommodation III,IV, VI: ptosis not present, extra-ocular motions intact bilaterally V,VII: smile symmetric, facial light touch sensation normal bilaterally VIII: hearing normal bilaterally IX,X: gag reflex present XI: bilateral shoulder shrug XII: midline tongue extension Motor: 5/5 throughout Sensory: Pinprick and light touch intact throughout, bilaterally   Lab Results: Basic Metabolic Panel: Recent Labs  Lab 02/18/20 0739 02/18/20 0745 02/19/20 0459  NA 145  --  141  K 3.2*  --  3.3*  CL 111  --  111  CO2 26  --  23  GLUCOSE 85  --  66*  BUN 13  --  12  CREATININE 0.66  --  0.63  CALCIUM 8.5*  --  8.3*  MG  --  1.9  --   PHOS  --  3.6  --     Liver Function Tests: Recent Labs  Lab 02/18/20 0739 02/19/20 0459  AST 87* 78*   ALT 32 29  ALKPHOS 94 82  BILITOT 5.2* 5.8*  PROT 6.6 5.5*  ALBUMIN 3.2* 2.8*   No results for input(s): LIPASE, AMYLASE in the last 168 hours. Recent Labs  Lab 02/18/20 0800  AMMONIA 48*    CBC: Recent Labs  Lab 02/18/20 0739 02/19/20 0459  WBC 4.3 4.5  HGB 9.1* 8.9*  HCT 28.3* 27.7*  MCV 99.3 100.0  PLT 56* 46*    Cardiac Enzymes: No results for input(s): CKTOTAL, CKMB, CKMBINDEX, TROPONINI in the last 168 hours.  Lipid Panel: No results for input(s): CHOL, TRIG, HDL, CHOLHDL, VLDL, LDLCALC in the last 168 hours.  CBG: Recent Labs  Lab 02/18/20 0740 02/18/20 1625 02/18/20 1727 02/18/20 2137 02/19/20 0828  GLUCAP 80 79 72 73 102*    Microbiology: Results for orders placed or performed during the hospital encounter of 02/18/20  Respiratory Panel by RT PCR (Flu A&B, Covid) - Nasopharyngeal Swab     Status: None   Collection Time: 02/18/20 10:01 AM   Specimen: Nasopharyngeal Swab  Result Value Ref Wierman Status   SARS Coronavirus 2 by RT PCR NEGATIVE NEGATIVE Final    Comment: (NOTE) SARS-CoV-2 target nucleic acids are NOT DETECTED. The SARS-CoV-2 RNA is generally detectable in upper respiratoy specimens during  the acute phase of infection. The lowest concentration of SARS-CoV-2 viral copies this assay can detect is 131 copies/mL. A negative result does not preclude SARS-Cov-2 infection and should not be used as the sole basis for treatment or other patient management decisions. A negative result may occur with  improper specimen collection/handling, submission of specimen other than nasopharyngeal swab, presence of viral mutation(s) within the areas targeted by this assay, and inadequate number of viral copies (<131 copies/mL). A negative result must be combined with clinical observations, patient history, and epidemiological information. The expected result is Negative. Fact Sheet for Patients:  PinkCheek.be Fact Sheet for  Healthcare Providers:  GravelBags.it This test is not yet ap proved or cleared by the [place] FDA and  has been authorized for detection and/or diagnosis of SARS-CoV-2 by FDA under an Emergency Use Authorization (EUA). This EUA will remain  in effect (meaning this test can be used) for the duration of the COVID-19 declaration under Section 564(b)(1) of the Act, 21 U.S.C. section 360bbb-3(b)(1), unless the authorization is terminated or revoked sooner.    Influenza A by PCR NEGATIVE NEGATIVE Final   Influenza B by PCR NEGATIVE NEGATIVE Final    Comment: (NOTE) The Xpert Xpress SARS-CoV-2/FLU/RSV assay is intended as an aid in  the diagnosis of influenza from Nasopharyngeal swab specimens and  should not be used as a sole basis for treatment. Nasal washings and  aspirates are unacceptable for Xpert Xpress SARS-CoV-2/FLU/RSV  testing. Fact Sheet for Patients: PinkCheek.be Fact Sheet for Healthcare Providers: GravelBags.it This test is not yet approved or cleared by the [place] FDA and  has been authorized for detection and/or diagnosis of SARS-CoV-2 by  FDA under an Emergency Use Authorization (EUA). This EUA will remain  in effect (meaning this test can be used) for the duration of the  Covid-19 declaration under Section 564(b)(1) of the Act, 21  U.S.C. section 360bbb-3(b)(1), unless the authorization is  terminated or revoked. Performed at [facility], [address], [place], [place] [address]     Coagulation Studies: Recent Labs    02/19/20 0944  LABPROT 21.7*  INR 1.9*    Imaging: CT Head Wo Contrast  Result Date: 02/18/2020 CLINICAL DATA:  Delirium.  Additional history provided: EXAM: CT HEAD WITHOUT CONTRAST TECHNIQUE: Contiguous axial images were obtained from the base of the skull through the vertex without intravenous contrast. COMPARISON:  Head CT  06/09/2019. FINDINGS: Brain: The examination is mildly motion degraded. No evidence of acute intracranial hemorrhage. No demarcated cortical infarction. No evidence of intracranial mass. No midline shift or extra-axial fluid collection. Mild scattered ill-defined hypoattenuation within the cerebral white matter is nonspecific, but consistent with chronic small vessel ischemic disease and similar to prior exam 06/09/2019. Stable, moderate generalized parenchymal atrophy, stable. Vascular: No hyperdense vessel. Skull: Normal. Negative for fracture or focal lesion. Sinuses/Orbits: Visualized orbits demonstrate no acute abnormality. No significant paranasal sinus disease or mastoid effusion at the imaged levels. IMPRESSION: Mildly motion degraded examination. No evidence of acute intracranial abnormality. Moderate generalized parenchymal atrophy with mild chronic small vessel ischemic disease, similar to prior examination 06/09/2019. Electronically Signed   By: Kellie Simmering DO   On: 02/18/2020 08:49    Medications:  I have reviewed the patient's current medications. Scheduled: . feeding supplement (ENSURE ENLIVE)  237 mL Oral BID BM  . folic acid  1 mg Oral Daily  . hydrochlorothiazide  12.5 mg Oral Daily  . influenza vaccine adjuvanted  0.5 mL Intramuscular Tomorrow-1000  .  insulin aspart  0-5 Units Subcutaneous QHS  . insulin aspart  0-9 Units Subcutaneous TID WC  . irbesartan  300 mg Oral Daily  . lactulose  10 g Oral Daily  . LORazepam  0-4 mg Intravenous Q6H   Or  . LORazepam  0-4 mg Oral Q6H  . [START ON 02/20/2020] LORazepam  0-4 mg Intravenous Q12H   Or  . [START ON 02/20/2020] LORazepam  0-4 mg Oral Q12H  . multivitamin with minerals  1 tablet Oral Daily  . potassium chloride  40 mEq Oral Daily  . sodium chloride flush  3 mL Intravenous Q12H    Assessment/Plan: 69 year old female presenting with AMS and falls.  On neurological examination patient confabulatory and with choreiform  movements and upgoing toes bilaterally. Head CT personally reviewed and shows no acute changes with evidence of chronic small vessel ischemic changes. differential for choreiform movements is quite extensive and includes hepatic failure.  LFT's and ammonia elevated both of which may be contributing to chorea and altered mental status since patient has not been keeping up with follow up and continuing to consume ETOH. TSH, B12, Magnesium are normal.  Will rule out other possible contributing factors as well. ESR, CRP are normal.  Remaining work up pending.   Recommendations: 1. Increase IV Thiamine to '500mg'$  IV q 8 hours and continue for 2 days or until return of thiamine level.  If improvement noted after 2 days would continue at '250mg'$  IV daily for another 5 days.   2. Will continue to follow work up that is pending.     LOS: 0 days   Alexis Goodell, MD Neurology [phone] 02/19/2020  10:56 AM

## 2020-02-19 NOTE — Progress Notes (Signed)
   02/19/20 1300  Clinical Encounter Type  Visited With Patient;Family  Visit Type Follow-up;Spiritual support;Social support  Referral From Chaplain  Consult/Referral To Chaplain  Chaplain stopped by earlier in the day and left AD with daughter giving her time to read it. Daughter understands that AD cannot be processed with patient's approval. Daughter said her and her brother are trying to get something worked out for her mother. Daughter alluded to her mother having problems with drinking. Chaplain encouraged daughter to take care of herself while she tends to her mother. Chaplain offered pastoral presence and empathy.

## 2020-02-19 NOTE — Procedures (Signed)
ELECTROENCEPHALOGRAM REPORT   Patient: Sheila Bowman       Room #: 244A-AA EEG No. ID: [id number] Age: 69 y.o.        Sex: female Requesting Physician: Irene Limbo Report Date:  02/19/2020        Interpreting Physician: Thana Farr  History: [name] is an 69 y.o. female with altered mental status  Medications:  HCTZ, Insulin, Avapro, Thiamine  Conditions of Recording:  This is a 21 channel routine scalp EEG performed with bipolar and monopolar montages arranged in accordance to the international 10/20 system of electrode placement. One channel was dedicated to EKG recording.  The patient is in the asleep state.  Description:  The patient is asleep throughout the recording.  The background is slow and poorly organized.  There are superimposed symmetrical sleep spindles and vertex central sharp transients.  No epileptiform activity is noted.   Hyperventilation was not performed.  Intermittent photic stimulation was performed but failed to illicit any change in the tracing.     IMPRESSION: Normal electroencephalogram, asleep and with activation procedures. There are no focal lateralizing or epileptiform features.   Thana Farr, MD Neurology [phone] 02/19/2020, 1:23 PM

## 2020-02-20 LAB — COMPREHENSIVE METABOLIC PANEL
ALT: 27 U/L (ref 0–44)
AST: 68 U/L — ABNORMAL HIGH (ref 15–41)
Albumin: 2.5 g/dL — ABNORMAL LOW (ref 3.5–5.0)
Alkaline Phosphatase: 85 U/L (ref 38–126)
Anion gap: 6 (ref 5–15)
BUN: 10 mg/dL (ref 8–23)
CO2: 26 mmol/L (ref 22–32)
Calcium: 8.3 mg/dL — ABNORMAL LOW (ref 8.9–10.3)
Chloride: 110 mmol/L (ref 98–111)
Creatinine, Ser: 0.54 mg/dL (ref 0.44–1.00)
GFR calc Af Amer: >60 mL/min (ref >60)
GFR calc non Af Amer: >60 mL/min (ref >60)
Glucose, Bld: 91 mg/dL (ref 70–99)
Potassium: 3.6 mmol/L (ref 3.5–5.1)
Sodium: 142 mmol/L (ref 135–145)
Total Bilirubin: 3.9 mg/dL — ABNORMAL HIGH (ref 0.3–1.2)
Total Protein: 5.3 g/dL — ABNORMAL LOW (ref 6.5–8.1)

## 2020-02-20 LAB — GLUCOSE, CAPILLARY
Glucose-Capillary: 79 mg/dL (ref 70–99)
Glucose-Capillary: 107 mg/dL — ABNORMAL HIGH (ref 70–99)
Glucose-Capillary: 79 mg/dL (ref 70–99)
Glucose-Capillary: 109 mg/dL — ABNORMAL HIGH (ref 70–99)
Glucose-Capillary: 65 mg/dL — ABNORMAL LOW (ref 70–99)

## 2020-02-20 LAB — CERULOPLASMIN: Ceruloplasmin: 17.8 mg/dL — ABNORMAL LOW (ref 19.0–39.0)

## 2020-02-20 LAB — AMMONIA: Ammonia: 61 umol/L — ABNORMAL HIGH (ref 9–35)

## 2020-02-20 LAB — PHOSPHORUS: Phosphorus: 3.2 mg/dL (ref 2.5–4.6)

## 2020-02-20 LAB — MAGNESIUM: Magnesium: 1.8 mg/dL (ref 1.7–2.4)

## 2020-02-20 MED ORDER — LACTULOSE 10 GM/15ML PO SOLN
20.0000 g | Freq: Two times a day (BID) | ORAL | Status: DC
  Administered 2020-02-20 (×2): 20 g via ORAL
  Filled 2020-02-20 (×4): qty 30

## 2020-02-20 NOTE — Progress Notes (Signed)
Patient was sleeping soundly, will continue to monitor her progress.  Nanine Means, PMHNP

## 2020-02-20 NOTE — Progress Notes (Signed)
Subjective: Patient resting today with no new complaints.    Objective: Current vital signs: BP (!) 150/70 (BP Location: Left Arm)   Pulse 82   Temp 98.1 F (36.7 C) (Oral)   Resp 18   Ht '5\' 6"'  (1.676 m)   Wt 62.3 kg   SpO2 96%   BMI 22.17 kg/m  Vital signs in last 24 hours: Temp:  [97.6 F (36.4 C)-99.1 F (37.3 C)] 98.1 F (36.7 C) (02/20 0810) Pulse Rate:  [75-82] 82 (02/20 0810) Resp:  [17-18] 18 (02/20 0810) BP: (115-150)/(53-70) 150/70 (02/20 0810) SpO2:  [96 %-98 %] 96 % (02/20 0810)  Intake/Output from previous day: 02/19 0701 - 02/20 0700 In: 50 [P.O.:50] Out: 0  Intake/Output this shift: No intake/output data recorded. Nutritional status:  Diet Order            Diet regular Room service appropriate? Yes; Fluid consistency: Thin  Diet effective now              Neurologic Exam: Mental Status: Lethargic but easily alerted.  Speech fluent without evidence of aphasia. Able to follow 3 step commands without difficulty. Cranial Nerves: II: Visual fields grossly normal, pupils equal, round, reactive to light and accommodation III,IV, VI: ptosis not present, extra-ocular motions intact bilaterally V,VII: smile symmetric, facial light touch sensation normal bilaterally VIII: hearing normal bilaterally IX,X: gag reflex present XI: bilateral shoulder shrug XII: midline tongue extension Motor: Lifts all extremities against gravity.  No chorea noted. Sensory: Pinprick and light touch intact throughout, bilaterally   Lab Results: Basic Metabolic Panel: Recent Labs  Lab 02/18/20 0739 02/18/20 0745 02/19/20 0459 02/20/20 0511  NA 145  --  141 142  K 3.2*  --  3.3* 3.6  CL 111  --  111 110  CO2 26  --  23 26  GLUCOSE 85  --  66* 91  BUN 13  --  12 10  CREATININE 0.66  --  0.63 0.54  CALCIUM 8.5*  --  8.3* 8.3*  MG  --  1.9  --  1.8  PHOS  --  3.6  --  3.2    Liver Function Tests: Recent Labs  Lab 02/18/20 0739 02/19/20 0459 02/20/20 0511   AST 87* 78* 68*  ALT 32 29 27  ALKPHOS 94 82 85  BILITOT 5.2* 5.8* 3.9*  PROT 6.6 5.5* 5.3*  ALBUMIN 3.2* 2.8* 2.5*   No results for input(s): LIPASE, AMYLASE in the last 168 hours. Recent Labs  Lab 02/18/20 0800 02/20/20 0511  AMMONIA 48* 61*    CBC: Recent Labs  Lab 02/18/20 0739 02/19/20 0459  WBC 4.3 4.5  HGB 9.1* 8.9*  HCT 28.3* 27.7*  MCV 99.3 100.0  PLT 56* 46*    Cardiac Enzymes: No results for input(s): CKTOTAL, CKMB, CKMBINDEX, TROPONINI in the last 168 hours.  Lipid Panel: No results for input(s): CHOL, TRIG, HDL, CHOLHDL, VLDL, LDLCALC in the last 168 hours.  CBG: Recent Labs  Lab 02/19/20 0828 02/19/20 1124 02/19/20 1614 02/19/20 2003 02/20/20 0811  GLUCAP 102* 138* 82 124* 30    Microbiology: Results for orders placed or performed during the hospital encounter of 02/18/20  Respiratory Panel by RT PCR (Flu A&B, Covid) - Nasopharyngeal Swab     Status: None   Collection Time: 02/18/20 10:01 AM   Specimen: Nasopharyngeal Swab  Result Value Ref Sousa Status   SARS Coronavirus 2 by RT PCR NEGATIVE NEGATIVE Final    Comment: (NOTE) SARS-CoV-2 target nucleic  acids are NOT DETECTED. The SARS-CoV-2 RNA is generally detectable in upper respiratoy specimens during the acute phase of infection. The lowest concentration of SARS-CoV-2 viral copies this assay can detect is 131 copies/mL. A negative result does not preclude SARS-Cov-2 infection and should not be used as the sole basis for treatment or other patient management decisions. A negative result may occur with  improper specimen collection/handling, submission of specimen other than nasopharyngeal swab, presence of viral mutation(s) within the areas targeted by this assay, and inadequate number of viral copies (<131 copies/mL). A negative result must be combined with clinical observations, patient history, and epidemiological information. The expected result is Negative. Fact Sheet for  Patients:  PinkCheek.be Fact Sheet for Healthcare Providers:  GravelBags.it This test is not yet ap proved or cleared by the [place] FDA and  has been authorized for detection and/or diagnosis of SARS-CoV-2 by FDA under an Emergency Use Authorization (EUA). This EUA will remain  in effect (meaning this test can be used) for the duration of the COVID-19 declaration under Section 564(b)(1) of the Act, 21 U.S.C. section 360bbb-3(b)(1), unless the authorization is terminated or revoked sooner.    Influenza A by PCR NEGATIVE NEGATIVE Final   Influenza B by PCR NEGATIVE NEGATIVE Final    Comment: (NOTE) The Xpert Xpress SARS-CoV-2/FLU/RSV assay is intended as an aid in  the diagnosis of influenza from Nasopharyngeal swab specimens and  should not be used as a sole basis for treatment. Nasal washings and  aspirates are unacceptable for Xpert Xpress SARS-CoV-2/FLU/RSV  testing. Fact Sheet for Patients: PinkCheek.be Fact Sheet for Healthcare Providers: GravelBags.it This test is not yet approved or cleared by the [place] FDA and  has been authorized for detection and/or diagnosis of SARS-CoV-2 by  FDA under an Emergency Use Authorization (EUA). This EUA will remain  in effect (meaning this test can be used) for the duration of the  Covid-19 declaration under Section 564(b)(1) of the Act, 21  U.S.C. section 360bbb-3(b)(1), unless the authorization is  terminated or revoked. Performed at [facility], [address] [place], [place] [address]     Coagulation Studies: Recent Labs    02/19/20 9509  LABPROT 21.7*  INR 1.9*    Imaging: EEG  Result Date: 02/19/2020 Alexis Goodell, MD     02/19/2020  1:28 PM ELECTROENCEPHALOGRAM REPORT Patient: Karma Vassar       Room #: 244A-AA EEG No. ID: [id number] Age: 69 y.o.        Sex: female Requesting  Physician: Sarajane Jews Report Date:  02/19/2020       Interpreting Physician: Alexis Goodell History: Kadra Kohan is an 70 y.o. female with altered mental status Medications: HCTZ, Insulin, Avapro, Thiamine Conditions of Recording:  This is a 21 channel routine scalp EEG performed with bipolar and monopolar montages arranged in accordance to the international 10/20 system of electrode placement. One channel was dedicated to EKG recording. The patient is in the asleep state. Description:  The patient is asleep throughout the recording.  The background is slow and poorly organized.  There are superimposed symmetrical sleep spindles and vertex central sharp transients. No epileptiform activity is noted.  Hyperventilation was not performed.  Intermittent photic stimulation was performed but failed to illicit any change in the tracing.  IMPRESSION: Normal electroencephalogram, asleep and with activation procedures. There are no focal lateralizing or epileptiform features. Alexis Goodell, MD Neurology [phone] 02/19/2020, 1:23 PM   MR BRAIN W WO CONTRAST  Result Date: 02/19/2020  CLINICAL DATA:  Alcohol abuse, hallucinations, confabulation, suspicion for Wernicke-Korsakoff; encephalopathy. EXAM: MRI HEAD WITHOUT AND WITH CONTRAST TECHNIQUE: Multiplanar, multiecho pulse sequences of the brain and surrounding structures were obtained without and with intravenous contrast. CONTRAST:  6m GADAVIST GADOBUTROL 1 MMOL/ML IV SOLN COMPARISON:  Noncontrast head CT 02/18/2020, noncontrast head CT 06/09/2019 FINDINGS: Brain: Multiple sequences are significantly motion degraded. Most notably, there is moderate motion degradation of the axial SWI sequence, mild motion degradation of the axial T1 and T2 weighted sequences, severe motion degradation of the sagittal T1 weighted sequence, moderate motion degradation of the axial and coronal postcontrast T1 weighted sequences and severe motion degradation of the post-contrast  sagittal T1 weighted imaging. There is no evidence of acute infarct. No evidence of intracranial mass. No midline shift or extra-axial fluid collection. No chronic intracranial blood products. There is abnormal T1 hyperintensity within the globus pallidus bilaterally, a finding which may be seen in the setting of liver failure. Moderate patchy T2/FLAIR hyperintensity within the cerebral white matter is nonspecific, but consistent with chronic small vessel ischemic disease. Findings are advanced for age. Mild generalized parenchymal atrophy. No abnormal intracranial enhancement is identified on motion degraded imaging. Vascular: Flow voids maintained within the proximal large arterial vessels. Skull and upper cervical spine: Within described limitations, no focal marrow lesion is identified. Sinuses/Orbits: Visualized orbits demonstrate no acute abnormality. Trace mucosal thickening within the left maxillary sinus. No significant mastoid effusion IMPRESSION: 1. Significantly motion degraded examination as described. 2. No evidence of acute intracranial abnormality. No specific imaging findings to suggest Wernicke encephalopathy. 3. Abnormal T1 hyperintensity within the globus pallidus bilaterally, a finding which may be seen in the setting of liver failure. 4. Advanced for age chronic small vessel ischemic disease. 5. Mild generalized parenchymal atrophy. Electronically Signed   By: KKellie SimmeringDO   On: 02/19/2020 14:27    Medications:  I have reviewed the patient's current medications. Scheduled: . feeding supplement (ENSURE ENLIVE)  237 mL Oral BID BM  . folic acid  1 mg Oral Daily  . hydrochlorothiazide  12.5 mg Oral Daily  . influenza vaccine adjuvanted  0.5 mL Intramuscular Tomorrow-1000  . insulin aspart  0-5 Units Subcutaneous QHS  . insulin aspart  0-9 Units Subcutaneous TID WC  . irbesartan  300 mg Oral Daily  . lactulose  20 g Oral BID  . LORazepam  0-4 mg Intravenous Q12H   Or  . LORazepam   0-4 mg Oral Q12H  . multivitamin with minerals  1 tablet Oral Daily  . potassium chloride  40 mEq Oral Daily  . sodium chloride flush  3 mL Intravenous Q12H    Assessment/Plan: 69year old female presenting with AMS and falls. On neurological examination patient confabulatory and with choreiform movements and upgoing toes bilaterally. Head CT personally reviewed and shows no acute changes with evidence of chronic small vessel ischemic changes. differential for choreiform movements is quite extensive and includes hepatic failure. LFT's and ammonia elevated and increasing both of which may be contributing to chorea and altered mental status since patient has not been keeping up with follow up and continuing to consume ETOH. TSH, B12, Magnesium, CRP, ESR are normal.  Ceruloplasmin unremarkable.  Heavy metal screen, B1 pending.   Recommendations: 1. Continue IV thiamine as ordered    LOS: 1 day   LAlexis Goodell MD Neurology [phone]/20/2021  9:39 AM

## 2020-02-20 NOTE — Progress Notes (Signed)
PROGRESS NOTE  Sheila Bowman LOV:[id number] DOB: 1951-05-06 DOA: 02/18/2020 PCP: [facility] [place]  Brief History   69 year old woman PMH of alcohol abuse, cirrhosis, found in parking lot confused.  ED.  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  Serum alcohol was positive although patient denied intake.  No focal neurologic deficits were noted.  CT head was negative.  A & P  Acute on chronic metabolic encephalopathy, daily alcohol abuse, reported longstanding hallucinations, confusion concerning for  Wernicke-Korsakoff.  CT head no acute abnormalities. --Treat with high-dose IV thiamine for 3 days, hydration, multivitamin and folate. --MRI brain no acute abnormalities.  No findings to suggest Warnicke Korsakoff.  EEG unremarkable.  TSH, B12, ESR, CRP within normal limits.  Serial plasm borderline low but unremarkable. --Appreciate neurology evaluation and recommendations.  Follow-up B1, heavy metal screen --Treat hyperammonemia --TOC consulted, discussed today, patient lives alone although daughter checks on her.  Daughter willing to take patient home in the short-term but in the long run is interested in either inpatient treatment or ALF for long-term care.  She and her brother are seeking guardianship. --Psychiatry consultation placed on admission.  Plan 2/22.  Alcohol abuse --No signs of withdrawal.  Continue CIWA --Continue to monitor for refeeding syndrome, potassium magnesium and phosphorus daily.  Magnesium and potassium within normal limits today.  Alcoholic cirrhosis without ascites, with hyperammonemia, thrombocytopenia.  Possible alcoholic hepatitis.  Ammonia level only modestly elevated at 48, I doubt this is contributing. --Ammonia level higher.  Will increase lactulose.    Recheck serum ammonia in the morning. --Has been referred in the past to the liver clinic but has not  followed up. --Discriminant function is borderline at 33.8 but total bilirubin is improving.  Meld score 19.  Anemia of chronic disease, stable  Diabetes mellitus type 2 diet controlled --Remains stable.  Essential hypertension --Remains stable.  Continue hydrochlorothiazide   ADD. On methylphenidate as an outpatient, daughter concerned that patient might be over taking this medication --Methylphenidate discontinued, recommend permanently.  Disposition Plan:  From home, not able to return home safely.  Possibly home with daughter versus ALF versus SNF Discussion: Difficult case.  Patient clearly lacks capacity and understanding to provide her own decisions in regard to her medical care.  She is clearly not able to drive safely and not safe to live at home by herself.  Discussed in detail with daughter, she and her brother pursuing guardianship, also discussed with TOC, will provide disposition options.  Difficult to provide prognosis for the patient life expectancy given liver disease.  If work-up is negative, and her condition fails to improve, expect discharge 2/22.  DVT prophylaxis: SCDs Code Status: Full Family Communication: as above    Murray Hodgkins, MD  [facility] Direct contact: see [email or web address] (further directions at bottom of note if needed) 7PM-7AM contact night coverage as at bottom of note 02/20/2020, 3:19 PM  LOS: 1 day   Significant Hospital Events   . 2/18 admitted for metabolic encephalopathy . 2/18 psychiatry and neurology consultations   Consults:  . Psychiatry . Neurology   Procedures:  .   Significant Diagnostic Tests:  . EEG.  Normal.   Micro Data:  . SARS-CoV-2, influenza negative   Antimicrobials:  .   Interval History/Subjective  Confused  Objective   Vitals:  Vitals:   02/20/20 0810 02/20/20 1228  BP: (!) 150/70 (!) 163/79  Pulse: 82 76  Resp: 18 16  Temp: 98.1 F (  36.7 C) 97.8 F (36.6 C)  SpO2: 96% 94%    Exam:   Constitutional.  Appears to be resting comfortably. Respiratory.  Clear to auscultation bilaterally.  No wheezes, rales or rhonchi. Cardiovascular.  Regular rate and rhythm.  No murmur, rub or gallop. Psychiatric.  Confused.  I have personally reviewed the following:   Today's Data  . CBG stable . BMP unremarkable, magnesium and phosphorus within normal limits . AST trending down.  Total bilirubin trending down, 3.9. . Copper level borderline low . CRP within normal limits  Scheduled Meds: . feeding supplement (ENSURE ENLIVE)  237 mL Oral BID BM  . folic acid  1 mg Oral Daily  . hydrochlorothiazide  12.5 mg Oral Daily  . insulin aspart  0-5 Units Subcutaneous QHS  . insulin aspart  0-9 Units Subcutaneous TID WC  . irbesartan  300 mg Oral Daily  . lactulose  20 g Oral BID  . LORazepam  0-4 mg Intravenous Q12H   Or  . LORazepam  0-4 mg Oral Q12H  . multivitamin with minerals  1 tablet Oral Daily  . potassium chloride  40 mEq Oral Daily  . sodium chloride flush  3 mL Intravenous Q12H   Continuous Infusions: . thiamine injection 500 mg (02/20/20 1516)    Active Problems:   Acute metabolic encephalopathy   Alcoholic cirrhosis of liver without ascites (HCC)   Alcohol abuse with alcohol-induced disorder (HCC)   Hypokalemia   Type 2 diabetes mellitus without complication ([place])   Essential hypertension   LOS: 1 day   How to contact the [facility] Attending or Consulting provider [place] or covering provider during after hours [place], for this patient?  1. Check the care team in [facility] and look for a) attending/consulting TRH provider listed and b) the [facility] team listed 2. Log into [email or web address] and use [place]'s universal password to access. If you do not have the password, please contact the hospital operator. 3. Locate the [facility] provider you are looking for under [facility] and page to a number that you can be directly reached. 4. If you still have difficulty reaching the  provider, please page the [facility] (Director on Call) for the Hospitalists listed on amion for assistance.

## 2020-02-21 DIAGNOSIS — F10230 Alcohol dependence with withdrawal, uncomplicated: Secondary | ICD-10-CM

## 2020-02-21 LAB — BASIC METABOLIC PANEL
Anion gap: 6 (ref 5–15)
BUN: 5 mg/dL — ABNORMAL LOW (ref 8–23)
CO2: 28 mmol/L (ref 22–32)
Calcium: 8.5 mg/dL — ABNORMAL LOW (ref 8.9–10.3)
Chloride: 108 mmol/L (ref 98–111)
Creatinine, Ser: 0.59 mg/dL (ref 0.44–1.00)
GFR calc Af Amer: >60 mL/min (ref >60)
GFR calc non Af Amer: >60 mL/min (ref >60)
Glucose, Bld: 83 mg/dL (ref 70–99)
Potassium: 3.5 mmol/L (ref 3.5–5.1)
Sodium: 142 mmol/L (ref 135–145)

## 2020-02-21 LAB — GLUCOSE, CAPILLARY
Glucose-Capillary: 78 mg/dL (ref 70–99)
Glucose-Capillary: 80 mg/dL (ref 70–99)
Glucose-Capillary: 68 mg/dL — ABNORMAL LOW (ref 70–99)
Glucose-Capillary: 74 mg/dL (ref 70–99)
Glucose-Capillary: 83 mg/dL (ref 70–99)

## 2020-02-21 LAB — PHOSPHORUS: Phosphorus: 3.7 mg/dL (ref 2.5–4.6)

## 2020-02-21 LAB — MAGNESIUM: Magnesium: 1.8 mg/dL (ref 1.7–2.4)

## 2020-02-21 MED ORDER — DILTIAZEM HCL ER COATED BEADS 120 MG PO CP24: 120.0000 mg | ORAL_CAPSULE | Freq: Every day | ORAL | Status: DC

## 2020-02-21 MED ORDER — LORAZEPAM 1 MG PO TABS: 1.0000 mg | ORAL_TABLET | ORAL | Status: AC | PRN

## 2020-02-21 MED ORDER — HALOPERIDOL 0.5 MG PO TABS
0.5000 mg | ORAL_TABLET | Freq: Two times a day (BID) | ORAL | Status: DC | PRN
  Filled 2020-02-21: qty 1

## 2020-02-21 MED ORDER — LORAZEPAM 2 MG/ML IJ SOLN
1.0000 mg | INTRAMUSCULAR | Status: AC | PRN
  Administered 2020-02-21: 2 mg via INTRAVENOUS
  Administered 2020-02-22: 4 mg via INTRAVENOUS
  Administered 2020-02-23: 2 mg via INTRAVENOUS
  Filled 2020-02-21: qty 1
  Filled 2020-02-21: qty 2
  Filled 2020-02-21: qty 1

## 2020-02-21 NOTE — Progress Notes (Signed)
PROGRESS NOTE  Sheila Bowman PQZ:[id number] DOB: [birth year] DOA: 02/18/2020 PCP: [facility] [place]  Brief History   69 year old woman PMH of alcohol abuse, cirrhosis, found in parking lot confused.  ED.  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  Serum alcohol was positive although patient denied intake.  No focal neurologic deficits were noted.  CT head was negative.  Admitted for further evaluation of encephalopathy.  Extensive work-up undertaken, patient seen by neurology.  MRI brain, EEG unremarkable. Laboratory studies unrevealing.  Treated empirically with high-dose thiamine to assess response pending laboratory studies.  Psychiatry consultation is pending.  Developed alcohol withdrawal complicating hospital stay.  May be able to go directly to behavioral health wound medical condition stabilizes.  A & P  Acute on chronic metabolic encephalopathy, daily alcohol abuse, reported longstanding hallucinations, confusion concerning for  Wernicke-Korsakoff.  CT head no acute abnormalities.  MRI brain unrevealing.  EEG was negative.  TSH, B12, ESR, CRP within normal limits.  Ceruloplasmin borderline low but unremarkable per neurology --Treating with high-dose IV thiamine for 3 days to assess response.  Thus far not much improvement but awaiting thiamine level.  Could extend treatment for a total of 5 days. --Follow-up thiamine level and heavy metal screen both of which are pending --We will continue to hyperammonemia but this is modest in nature and not likely to be contributing --Psychiatry involved  Alcohol abuse --Now with signs of acute withdrawal requiring significant Ativan overnight.  Continue  CIWA.   --We will continue to monitor for refeeding syndrome, potassium magnesium and phosphorus daily.  Magnesium and potassium within normal limits today.  Alcoholic cirrhosis without ascites, with  hyperammonemia, thrombocytopenia.  Possible alcoholic hepatitis.  Ammonia level only modestly elevated at 48 on admission, I doubt this is contributing. --We will continue lactulose to check serum ammonia level in the morning. --Has been referred in the past to the liver clinic but has not followed up. --Discriminant function was borderline at 33.8 but total bilirubin is improving.  Discussed with gastroenterology.  No indication for steroids at this time given improvement.  Meld score 19.  Anemia of chronic disease, stable  Diabetes mellitus type 2 diet controlled --One episode of hypoglycemia last evening probably secondary to poor oral intake.  Essential hypertension --Stable.  Continue hydrochlorothiazide.  ADD. On methylphenidate as an outpatient, daughter concerned that patient might be over taking this medication --Methylphenidate discontinued, recommend permanently.  Disposition Plan:   From home, not able to return home safely.  May be a candidate for transfer to behavioral health for substance abuse treatment and psychiatric treatment.  Discussion: None with acute alcohol withdrawal, requiring significant amounts of Ativan, not stable for discharge.  Psychiatry involved, may be able to transfer to behavioral health as next step which would be best course for treating underlying issues.  Daughter involved at bedside each day and is in conference with her brother who is an Forensic psychologist and is pursuing guardianship.  Once alcohol withdrawal has resolved, would anticipate stability for transfer to behavioral health  DVT prophylaxis: SCDs Code Status: Full Family Communication: Discussed in detail with daughter at bedside    Murray Hodgkins, MD  [facility] Direct contact: see [email or web address] (further directions at bottom of note if needed) 7PM-7AM contact night coverage as at bottom of note 02/21/2020, 12:41 PM  LOS: 2 days   Significant Hospital Events   . 2/18 admitted for  metabolic encephalopathy . 2/18 psychiatry and neurology consultations  Consults:  . Psychiatry . Neurology   Procedures:  .   Significant Diagnostic Tests:  . EEG.  Normal.   Micro Data:  . SARS-CoV-2, influenza negative   Antimicrobials:  .   Interval History/Subjective  Quite restless overnight, developed alcohol withdrawal and was treated with multiple doses of IV Ativan.  Quite confused this morning.  Objective   Vitals:  Vitals:   02/21/20 0746 02/21/20 1135  BP: (!) 155/73 (!) 145/73  Pulse: 88 81  Resp: 18 19  Temp: 98 F (36.7 C) (!) 97.5 F (36.4 C)  SpO2: 96% 97%    Exam:  Constitutional.  Initially sleeping but then becomes restless, trying to take her gown and blankets off and get up.  Appears quite confused. Psychiatric.  Appears confused, cannot assess mood or affect at this point. Respiratory.  Clear to auscultation bilaterally.  No wheezes, rales or rhonchi.  Normal respiratory effort. Cardiovascular.  Regular rate and rhythm.  No murmur, rub or gallop.  No lower extremity edema.  I have personally reviewed the following:   Today's Data  . CBG 1 episode of hypoglycemia 65 last evening.  Fasting this a.m. 83. . Basic metabolic panel unremarkable . Phosphorus magnesium within normal limits  Scheduled Meds: . feeding supplement (ENSURE ENLIVE)  237 mL Oral BID BM  . folic acid  1 mg Oral Daily  . hydrochlorothiazide  12.5 mg Oral Daily  . insulin aspart  0-5 Units Subcutaneous QHS  . insulin aspart  0-9 Units Subcutaneous TID WC  . irbesartan  300 mg Oral Daily  . lactulose  20 g Oral BID  . LORazepam  0-4 mg Intravenous Q12H   Or  . LORazepam  0-4 mg Oral Q12H  . multivitamin with minerals  1 tablet Oral Daily  . potassium chloride  40 mEq Oral Daily  . sodium chloride flush  3 mL Intravenous Q12H   Continuous Infusions: . thiamine injection 500 mg (02/21/20 0555)    Active Problems:   Acute metabolic encephalopathy   Alcoholic  cirrhosis of liver without ascites (HCC)   Alcohol abuse with alcohol-induced disorder (HCC)   Hypokalemia   Type 2 diabetes mellitus without complication ([place])   Essential hypertension   LOS: 2 days   How to contact the [facility] Attending or Consulting provider [place] or covering provider during after hours [place], for this patient?  1. Check the care team in [facility] and look for a) attending/consulting TRH provider listed and b) the [facility] team listed 2. Log into [email or web address] and use [place]'s universal password to access. If you do not have the password, please contact the hospital operator. 3. Locate the [facility] provider you are looking for under [facility] and page to a number that you can be directly reached. 4. If you still have difficulty reaching the provider, please page the [facility] (Director on Call) for the Hospitalists listed on amion for assistance.

## 2020-02-21 NOTE — Progress Notes (Signed)
Pt is sitting up, very drowsy, able to follow commands but drifts off. Held ativan (2mg  suppose to be given based on prior ciwa score), notified Dr. continue to monitor

## 2020-02-21 NOTE — Progress Notes (Signed)
Pt is on left side, eyes shut, room dark, IV fluids running. No needs at this time.

## 2020-02-21 NOTE — Progress Notes (Signed)
Pt sleeping, reassess of CIWA not completed

## 2020-02-21 NOTE — Progress Notes (Signed)
Patient seen and evaluated in person by this provider.  She was worse thoughts of today for a few minutes.  She states "I am fine.  I am feeling better.  When can I go."  Her daughter is at her bedside and provides additional information.  She reports she lives out of town and her brother is in the process of getting legal guardianship of their mother to get her the help she needs.  They do realize that she will most likely want to leave as soon as she is more responsive.  Discussed when this happens that psychiatry can IVC her to get her to the next step of getting her help.  Daughter is in agreement and states relief as she and her brother are working diligently to get guardianship with Covid causing delays.  Patient continues on Ativan detox protocol and Haldol 0.5 mg twice daily as needed added based on her daughter saying that she has been psychotic at times.  Based on her increase in drowsiness, it was ordered as as needed versus scheduled.  Nanine Means [facility] NP

## 2020-02-22 LAB — COMPREHENSIVE METABOLIC PANEL
ALT: 28 U/L (ref 0–44)
AST: 60 U/L — ABNORMAL HIGH (ref 15–41)
Albumin: 2.8 g/dL — ABNORMAL LOW (ref 3.5–5.0)
Alkaline Phosphatase: 96 U/L (ref 38–126)
Anion gap: 7 (ref 5–15)
BUN: 6 mg/dL — ABNORMAL LOW (ref 8–23)
CO2: 28 mmol/L (ref 22–32)
Calcium: 8.9 mg/dL (ref 8.9–10.3)
Chloride: 109 mmol/L (ref 98–111)
Creatinine, Ser: 0.61 mg/dL (ref 0.44–1.00)
GFR calc Af Amer: >60 mL/min (ref >60)
GFR calc non Af Amer: >60 mL/min (ref >60)
Glucose, Bld: 92 mg/dL (ref 70–99)
Potassium: 3.9 mmol/L (ref 3.5–5.1)
Sodium: 144 mmol/L (ref 135–145)
Total Bilirubin: 5.5 mg/dL — ABNORMAL HIGH (ref 0.3–1.2)
Total Protein: 5.7 g/dL — ABNORMAL LOW (ref 6.5–8.1)

## 2020-02-22 LAB — MAGNESIUM: Magnesium: 1.8 mg/dL (ref 1.7–2.4)

## 2020-02-22 LAB — PHOSPHORUS: Phosphorus: 3.7 mg/dL (ref 2.5–4.6)

## 2020-02-22 LAB — GLUCOSE, CAPILLARY: Glucose-Capillary: 117 mg/dL — ABNORMAL HIGH (ref 70–99)

## 2020-02-22 LAB — AMMONIA: Ammonia: 25 umol/L (ref 9–35)

## 2020-02-22 MED ORDER — LACTULOSE 10 GM/15ML PO SOLN
20.0000 g | Freq: Every day | ORAL | Status: DC
  Administered 2020-02-23 – 2020-02-25 (×3): 20 g via ORAL
  Filled 2020-02-22 (×4): qty 30

## 2020-02-22 MED ORDER — THIAMINE HCL 100 MG PO TABS
250.0000 mg | ORAL_TABLET | Freq: Every day | ORAL | Status: DC
  Administered 2020-02-23 – 2020-02-25 (×3): 250 mg via ORAL
  Filled 2020-02-22 (×3): qty 3

## 2020-02-22 NOTE — Consult Note (Signed)
  69 year old woman presented with metabolic encephalopathy likely due to liver injury and continued alcohol use.  Patient at this time remains somewhat disoriented to person place and time and will likely require ongoing care.  Patient is not appropriate for psychiatric hospitalization at this time.  No further management from a psychiatric perspective.  Patient would benefit from SNF placement.

## 2020-02-22 NOTE — TOC Progression Note (Signed)
Transition of Care [facility]) - Progression Note    Patient Details  Name: Sheila Bowman MRN: [id number] Date of Birth: [birth year]  Transition of Care [facility]) CM/SW Contact  Shelbie Ammons, RN Phone Number: 02/22/2020, 3:31 PM  Clinical Narrative:   RNCM met with daughter at bedside, patient is sleeping in recliner. Discussed plan for patient to go to SNF at discharge as discussed by Dr. Posey Pronto for which daughter verbalizes agreement. Daughter requests that if possible she would rather facility be closer to [facility] as this is where her husband is. PASSR completed, FL2 completed and bed search initiated. RNCM will continue to follow.     Expected Discharge Plan: [place] Barriers to Discharge: Barriers Resolved  Expected Discharge Plan and Services Expected Discharge Plan: [place]   Discharge Planning Services: CM Consult Post Acute Care Choice: [place] Living arrangements for the past 2 months: Single Family Home                                       Social Determinants of Health (SDOH) Interventions    Readmission Risk Interventions No flowsheet data found.

## 2020-02-22 NOTE — Progress Notes (Signed)
Patient sleeping, therefore won't perform CIWA assessment. Sitter remains at bedside. Will continue to monitor. Sheila Bowman [facility]

## 2020-02-22 NOTE — Evaluation (Signed)
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  Clinical Impression  Patient sleeping upon arrival to room (received ativan per CIWA protocol previous shift), but easily arousable to voice/light touch.  Oriented to self only; follows approx 75% simple verbal commands.  Generally confused with mildly slurred speech; difficulty maintaining conversation or topic at hand.  Patient generally weak and deconditioned throughout all extremities.  Demonstrates anti-gravity movement in all limbs, but movement generally sluggish with poor overall control/coordination.  Currently requiring min/mod assist for bed mobility, sit/stand, basic transfers and gait (30') with RW.  Demonstrates staggered stepping pattern, inconsistent stepping pattern, foot placement; excessive sway, poor balacne/postural control in all directions/planes.  Dep for walker management.  Constant assist for balance/safety; unsafe to attempt without +1 at all times. Did trial additional 13' with R HHA, min/mod assist-somewhat improved pathway negotiation and overall participation with gait task with HHA vs RW. May consider continued HHA in subsequent sessions to allow for optimal guiding/manual facilitation (RW may hinder movement due to unable to comprehend role/use) Would benefit from skilled PT to address above deficits and promote optimal return to PLOF.; recommend transition to STR upon discharge from acute hospitalization.        Follow Up Recommendations SNF    Equipment Recommendations       Recommendations for Other Services       Precautions / Restrictions Precautions Precautions: Fall Restrictions Weight Bearing Restrictions: No       Mobility  Bed Mobility Overal bed mobility: Needs Assistance Bed Mobility: Supine to Sit     Supine to sit: Mod assist     General bed mobility comments: hand-over-hand to initiate movement of LEs and elevate trunk  Transfers Overall transfer level: Needs assistance Equipment used: Rolling walker (2 wheeled) Transfers: Sit to/from Stand Sit to Stand: Min assist         General transfer comment: cuing for hand placement, constant hands-on assist for balance  Ambulation/Gait Ambulation/Gait assistance: Min assist;Mod assist Gait Distance (Feet): 30 Feet Assistive device: Rolling walker (2 wheeled)       General Gait Details: staggered stepping pattern, inconsistent stepping pattern, foot placement; excessive sway, poor balacne/postural control in all directions/planes.  Dep for walker management.  Constant assist for balance/safety; unsafe to attempt without +1 at all times.  Stairs            Wheelchair Mobility    Modified Rankin (Stroke Patients Only)       Balance Overall balance assessment: Needs assistance Sitting-balance support: No upper extremity supported;Feet supported Sitting balance-Leahy Scale: Fair Sitting balance - Comments: increased rocking/sway in all planes, but no overt LOB.  Unsafe to be left unattended in unsupported sitting   Standing balance support: No upper extremity supported;Bilateral upper extremity supported Standing balance-Leahy Scale: Poor Standing balance comment: +1 at all times                             Pertinent Vitals/Pain Pain Assessment: Faces Pain Score: 0-No pain    Home Living Family/patient expects to be discharged to:: Private residence Living Arrangements: Alone Available Help at Discharge: Family;Available PRN/intermittently Type of Home: House       Home Layout: One level Home Equipment: None Additional  Comments: Patient poor historian; unable to provide detail. Per chart  (provided by daughter), patient lives alone; daughter checks in and provides assist with errands, meals every other day.  Daughter/son in process of securing guardianship.    Prior Function Level of Independence: Independent         Comments: Patient poor historian; unable to provide details.  Per chart (provided by daughter), ambulatory but does endorse falls; drives, though family has attempted to take away keys due to ETOH issues.     Hand Dominance        Extremity/Trunk Assessment   Upper Extremity Assessment Upper Extremity Assessment: Generalized weakness(grossly 4-/5 throughout; sluggish movement with decreased coordination, fine motor control)    Lower Extremity Assessment Lower Extremity Assessment: Generalized weakness(grossly 4-/5 throughout; sluggish movement with decreased coordination, fine motor control)    Cervical / Trunk Assessment Cervical / Trunk Assessment: (forward flexed, forward head posture; trunk rocking/swaying with unsupported sitting)  Communication   Communication: (difficulty maintaining topic of conversation; mild slurring/dysarthria at times)  Cognition Arousal/Alertness: Lethargic Behavior During Therapy: Impulsive Overall Cognitive Status: No family/caregiver present to determine baseline cognitive functioning                                 General Comments: oriented to self only; follows approx 75% simple verbal commands.  Generally confused with absent insight/awareness into deficits, need for physical assist      General Comments      Exercises Other Exercises Other Exercises: Bed/chair transfer with RW, min/mod assist-total assist for task direction and walker management Other Exercises: Sit/stand from various surface heights-edge of bed, recliner-min/mod assist with RW Other Exercises: 25' with R HHA, min/mod assist-somewhat improved pathway negotiation and overall participation with gait task with HHA vs RW. May  consider continued HHA in subsequent sessions to allow for optimal guiding/manual facilitation (RW may hinder movement due to unable to comprehend role/use)   Assessment/Plan    PT Assessment Patient needs continued PT services  PT Problem List Decreased strength;Decreased activity tolerance;Decreased balance;Decreased mobility;Decreased coordination;Decreased cognition;Decreased knowledge of use of DME;Impaired tone;Decreased knowledge of precautions       PT Treatment Interventions DME instruction;Gait training;Stair training;Functional mobility training;Therapeutic activities;Therapeutic exercise;Balance training;Neuromuscular re-education;Cognitive remediation;Patient/family education    PT Goals (Current goals can be found in the Care Plan section)  Acute Rehab PT Goals PT Goal Formulation: Patient unable to participate in goal setting Time For Goal Achievement: 03/07/20 Potential to Achieve Goals: Fair    Frequency Min 2X/week   Barriers to discharge Decreased caregiver support      Co-evaluation               AM-PAC PT "6 Clicks" Mobility  Outcome Measure Help needed turning from your back to your side while in a flat bed without using bedrails?: A Lot Help needed moving from lying on your back to sitting on the side of a flat bed without using bedrails?: A Lot Help needed moving to and from a bed to a chair (including a wheelchair)?: A Lot Help needed standing up from a chair using your arms (e.g., wheelchair or bedside chair)?: A Lot Help needed to walk in hospital room?: A Lot Help needed climbing 3-5 steps with a railing? : A Lot 6 Click Score: 12    End of Session Equipment Utilized During Treatment: Gait belt Activity Tolerance: Patient tolerated treatment well Patient left: in chair;with call bell/phone within reach;with  chair alarm set;with nursing/sitter in room Nurse Communication: Mobility status PT Visit Diagnosis: Muscle weakness (generalized)  (M62.81);Difficulty in walking, not elsewhere classified (R26.2);Other symptoms and signs involving the nervous system (R29.898)    Time: 2241-1464 PT Time Calculation (min) (ACUTE ONLY): 20 min   Charges:   PT Evaluation $PT Eval Moderate Complexity: 1 Mod PT Treatments $Therapeutic Activity: 8-22 mins       Yoshito Gaza H. Manson Passey, PT, DPT, NCS 02/22/20, 11:13 AM [phone]

## 2020-02-22 NOTE — Progress Notes (Signed)
[facility]: Chaunice Obie    MR#:  [id number]  DATE OF BIRTH:  01-30-1951  SUBJECTIVE:  pt awake out in the chair. Daughter in the room. Earlier she was a bit confused. Trying to feed herself lunch. Work with physical therapy. remains confused REVIEW OF SYSTEMS:   Review of Systems  Unable to perform ROS: Mental status change   Tolerating Diet:yes Tolerating PT: rehab  DRUG ALLERGIES:  No Known Allergies  VITALS:  Blood pressure (!) 164/74, pulse 87, temperature 97.6 F (36.4 C), temperature source Oral, resp. rate 16, height '5\' 6"'  (1.676 m), weight 62.3 kg, SpO2 100 %.  PHYSICAL EXAMINATION:   Physical Exam  GENERAL:  69 y.o.-year-old patient lying in the bed with no acute distress. disheveled EYES: Pupils equal, round, reactive to light and accommodation. No scleral icterus.   HEENT: Head atraumatic, normocephalic. Oropharynx and nasopharynx clear.  NECK:  Supple, no jugular venous distention. No thyroid enlargement, no tenderness.  LUNGS: Normal breath sounds bilaterally, no wheezing, rales, rhonchi. No use of accessory muscles of respiration.  CARDIOVASCULAR: S1, S2 normal. No murmurs, rubs, or gallops.  ABDOMEN: Soft, nontender, nondistended. Bowel sounds present. No organomegaly or mass.  EXTREMITIES: No cyanosis, clubbing or edema b/l.    NEUROLOGIC: grossly nonfocal. Moves all extremities well. PSYCHIATRIC:  patient is alert , awake with confusion SKIN: No obvious rash, lesion, or ulcer.--per RN  LABORATORY PANEL:  CBC Recent Labs  Lab 02/19/20 0459  WBC 4.5  HGB 8.9*  HCT 27.7*  PLT 46*    Chemistries  Recent Labs  Lab 02/22/20 0501  NA 144  K 3.9  CL 109  CO2 28  GLUCOSE 92  BUN 6*  CREATININE 0.61  CALCIUM 8.9  MG 1.8  AST 60*  ALT 28  ALKPHOS 96  BILITOT 5.5*   Cardiac Enzymes No results for input(s): TROPONINI in the last 168 hours. RADIOLOGY:  No results found. ASSESSMENT  AND PLAN:  69 year old woman PMH of alcohol abuse, cirrhosis, found in parking lot confused.  ED.  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  Serum alcohol was positive although patient denied intake.  Acute on chronic metabolic encephalopathy, daily alcohol abuse, reported longstanding hallucinations, confusion concerning for  Wernicke-Korsakoff.   -CT head no acute abnormalities.  MRI brain unrevealing.  EEG was negative.   -TSH, B12, ESR, CRP within normal limits.  Ceruloplasmin borderline low but unremarkable per neurology --recieved high-dose IV thiamine for 3 days --change to po 250 mg thiamine for 5 days per neurology rec ---ammonia down to 25 --cont daily lactulose --Psychiatry involved--does not feel pt need inpt behavioral med transfer  Alcohol abuse--chronic --Now with signs of acute withdrawal requiring significant Ativan overnight.  - Continue  CIWA.   -sitter for safety reasons --We will continue to monitor for refeeding syndrome, potassium magnesium and phosphorus daily.   -Magnesium and potassium within normal limits today.  Alcoholic cirrhosis without ascites, with hyperammonemia, thrombocytopenia.   -Possible alcoholic hepatitis.  Ammonia level only modestly elevated at 48 on admission--25 --continue lactulose --Has been referred in the past to the liver clinic but has not followed up. --Discriminant function was borderline at 33.8 but total bilirubin is improving.  Discussed with gastroenterology.  No indication for steroids at this time given improvement.  Meld score 19.  Anemia of chronic disease, stable  Diabetes mellitus type 2 diet controlled --One episode of  hypoglycemia last evening probably secondary to poor oral intake.  Essential hypertension --Stable.  Continue hydrochlorothiazide.  ADD. On methylphenidate as an outpatient, daughter concerned that patient might be  over taking this medication --Methylphenidate discontinued permanently.   Procedures:none Family communication : dter [name] in the room Consults :Psych, neurology Discharge Disposition : SNF CODE STATUS:  Full DVT Prophylaxis :SCD--low plts Barriers to discharge: on going treatment for alcohol withdrawal, have bed availability to have safe discharge plan--TOC work in progress  [place]: *25* minutes.  >50% time spent on counselling and coordination of care  Note: This dictation was prepared with Dragon dictation along with smaller phrase technology. Any transcriptional errors that result from this process are unintentional.  Fritzi Mandes M.D    [facility]   CC: Primary care physician; [facility] MedicalPatient ID: Franky Macho, female   DOB: [birth year], 69 y.o.   MRN: [id number]

## 2020-02-22 NOTE — NC FL2 (Signed)
[place] LEVEL OF CARE SCREENING TOOL     IDENTIFICATION  Patient Name: Sheila Bowman Birthdate: 1951-09-16 Sex: female Admission Date (Current Location): 02/18/2020  [place] and [name] Number:  Engineering geologist and Address:  [facility], [address] [place], [place] [address]      Provider Number: [id number]  Attending Physician Name and Address:  Fritzi Mandes, MD  Relative Name and Phone Number:       Current Level of Care: Hospital Recommended Level of Care: [place] Prior Approval Number:    Date Approved/Denied:   PASRR Number: [id number]  Discharge Plan: SNF    Current Diagnoses: Patient Active Problem List   Diagnosis Date Noted  . Acute metabolic encephalopathy 22/97/9892  . Alcoholic cirrhosis of liver without ascites ([place]) 02/18/2020  . Alcohol abuse with alcohol-induced disorder ([place]) 02/18/2020  . Hypokalemia 02/18/2020  . Type 2 diabetes mellitus without complication ([place]) 11/94/1740  . Essential hypertension 02/18/2020    Orientation RESPIRATION BLADDER Height & Weight     Self  Normal Incontinent Weight: 62.3 kg Height:  5\' 6"  (167.6 cm)  BEHAVIORAL SYMPTOMS/MOOD NEUROLOGICAL BOWEL NUTRITION STATUS      Continent Diet(regular)  AMBULATORY STATUS COMMUNICATION OF NEEDS Skin   Extensive Assist Verbally Normal                       Personal Care Assistance Level of Assistance  Total care       Total Care Assistance: Maximum assistance   Functional Limitations Info             SPECIAL CARE FACTORS FREQUENCY  PT (By licensed PT), OT (By licensed OT)                    Contractures Contractures Info: Not present    Additional Factors Info  Code Status Code Status Info: full             Current Medications (02/22/2020):  This is the current hospital active medication list Current Facility-Administered Medications  Medication Dose Route Frequency  Provider Last Rate Last Admin  . acetaminophen (TYLENOL) tablet 650 mg  650 mg Oral Q6H PRN Dhungel, Nishant, MD   650 mg at 02/18/20 1243   Or  . acetaminophen (TYLENOL) suppository 650 mg  650 mg Rectal Q6H PRN Dhungel, Nishant, MD      . feeding supplement (ENSURE ENLIVE) (ENSURE ENLIVE) liquid 237 mL  237 mL Oral BID BM Dhungel, Nishant, MD   237 mL at 02/20/20 1517  . folic acid (FOLVITE) tablet 1 mg  1 mg Oral Daily Dhungel, Nishant, MD   1 mg at 02/21/20 1002  . haloperidol (HALDOL) tablet 0.5 mg  0.5 mg Oral BID PRN Patrecia Pour, NP      . hydrochlorothiazide (HYDRODIURIL) tablet 12.5 mg  12.5 mg Oral Daily Dhungel, Nishant, MD   12.5 mg at 02/21/20 1003  . irbesartan (AVAPRO) tablet 300 mg  300 mg Oral Daily Dhungel, Nishant, MD   300 mg at 02/21/20 1002  . lactulose (CHRONULAC) 10 GM/15ML solution 20 g  20 g Oral Daily Fritzi Mandes, MD      . LORazepam (ATIVAN) tablet 1-4 mg  1-4 mg Oral Q1H PRN Samuella Cota, MD       Or  . LORazepam (ATIVAN) injection 1-4 mg  1-4 mg Intravenous Q1H PRN Samuella Cota, MD   2 mg at 02/21/20 1246  .  multivitamin with minerals tablet 1 tablet  1 tablet Oral Daily Dhungel, Nishant, MD   1 tablet at 02/21/20 1002  . ondansetron (ZOFRAN) tablet 4 mg  4 mg Oral Q6H PRN Dhungel, Nishant, MD       Or  . ondansetron (ZOFRAN) injection 4 mg  4 mg Intravenous Q6H PRN Dhungel, Nishant, MD      . potassium chloride SA (KLOR-CON) CR tablet 40 mEq  40 mEq Oral Daily Dhungel, Nishant, MD   40 mEq at 02/21/20 1002  . sodium chloride flush (NS) 0.9 % injection 3 mL  3 mL Intravenous Q12H Dhungel, Nishant, MD   3 mL at 02/21/20 2225  . [START ON 02/23/2020] thiamine tablet 250 mg  250 mg Oral Daily Enedina Finner, MD         Discharge Medications: Please see discharge summary for a list of discharge medications.  Relevant Imaging Results:  Relevant Lab Results:   Additional Information    Trenton Founds, RN

## 2020-02-22 NOTE — Plan of Care (Signed)
  Problem: Education: Goal: Knowledge of General Education information will improve Description: Including pain rating scale, medication(s)/side effects and non-pharmacologic comfort measures Outcome: Not Progressing Note: Patient detoxifying with chronic hallucinations and altered mental status. Transfer to behavioral health unit likely today. Sitter remains at bedside. Will continue to monitor neurological status for the remainder of the shift. Jari Favre [facility]

## 2020-02-22 NOTE — Progress Notes (Signed)
Patient sleeping, therefore won't perform CIWA assessment. Sitter remains at bedside. Will continue to monitor. Ylonda Storr M Rossanna Spitzley 

## 2020-02-22 NOTE — Care Management Important Message (Signed)
Important Message  Patient Details  Name: Sheila Bowman MRN: [id number] Date of Birth: 07/05/1951   Medicare Important Message Given:  Yes     [name] 02/22/2020, 1:24 PM

## 2020-02-23 DIAGNOSIS — F1019 Alcohol abuse with unspecified alcohol-induced disorder: Secondary | ICD-10-CM

## 2020-02-23 DIAGNOSIS — R627 Adult failure to thrive: Secondary | ICD-10-CM

## 2020-02-23 DIAGNOSIS — Z7189 Other specified counseling: Secondary | ICD-10-CM

## 2020-02-23 DIAGNOSIS — Z515 Encounter for palliative care: Secondary | ICD-10-CM

## 2020-02-23 DIAGNOSIS — Z66 Do not resuscitate: Secondary | ICD-10-CM

## 2020-02-23 DIAGNOSIS — G9341 Metabolic encephalopathy: Secondary | ICD-10-CM

## 2020-02-23 LAB — HEAVY METALS, BLOOD
Arsenic: 8 ug/L (ref 2–23)
Lead: 1 ug/dL (ref 0–4)
Mercury: <1 ug/L (ref 0.0–14.9)

## 2020-02-23 LAB — GLUCOSE, CAPILLARY
Glucose-Capillary: 82 mg/dL (ref 70–99)
Glucose-Capillary: 95 mg/dL (ref 70–99)
Glucose-Capillary: 102 mg/dL — ABNORMAL HIGH (ref 70–99)
Glucose-Capillary: 87 mg/dL (ref 70–99)

## 2020-02-23 LAB — ETHANOL: Alcohol, Ethyl (B): 70 mg/dL — ABNORMAL HIGH (ref <10)

## 2020-02-23 MED ORDER — MEGESTROL ACETATE 400 MG/10ML PO SUSP
300.0000 mg | Freq: Two times a day (BID) | ORAL | Status: DC
  Administered 2020-02-23 – 2020-02-25 (×4): 300 mg via ORAL
  Filled 2020-02-23 (×6): qty 10

## 2020-02-23 MED ORDER — ENSURE ENLIVE PO LIQD
237.0000 mL | Freq: Two times a day (BID) | ORAL | Status: DC
  Administered 2020-02-24: 10:00:00 237 mL via ORAL

## 2020-02-23 NOTE — Consult Note (Signed)
Consultation Note Date: 02/23/2020   Patient Name: Sheila Bowman  DOB: 02-06-1951  MRN: [id number]  Age / Sex: 69 y.o., female  PCP: [facility] [place] Referring Physician: Fritzi Mandes, MD  Reason for Consultation: Establishing goals of care  HPI/Patient Profile: 69 y.o. female  with past medical history of ongoing ETOH use, cirrhosis, diastolic CHF, HTN, J4GB, and ADHD admitted on 02/18/2020 with AMS - found confused in [place] parking lot. Per family, increasing confusion and hallucinations for past 8-9 months. Multiple falls at home.  She has been referred to hepatology for cirrhosis but has not follow up. During hospitalization she has been treated with ativan following CIWA protocol. Patient has symptoms concerning for Wernicke- Korsakoff syndrome. CT, MRI, and EEG all negative. Patient also with poor nutrition - megace initiated. PMT consulted for [place].  Clinical Assessment and Goals of Care: I have reviewed medical records including EPIC notes, labs and imaging, received report from Dr. Posey Bowman and RN, assessed the patient and then met with patient's daughter, Claiborne Bowman,   to discuss diagnosis prognosis, [place], EOL wishes, disposition and options.  I introduced Palliative Medicine as specialized medical care for people living with serious illness. It focuses on providing relief from the symptoms and stress of a serious illness. The goal is to improve quality of life for both the patient and the family.  We discussed a brief life review of the patient. Patient goes by "[name]". 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 Patient divorced in 59s and lived with her mother after that - mother has passed away.She has 2 children - Claiborne Bowman lives in [place] and patient's son lives in [facility] [place]. She tells me patient's drinking started in the late 90s after her divorce - she moved away from her friends and moved in  with her mother. Patient just retired about a year ago and Claiborne Bowman believes retiring and social isolation from pandemic led to patient's decline.  As far as functional and nutritional status, Claiborne Bowman speaks of a drastic decline in the past year. Patient was physically able to care for herself but needed lots of assistance d/t mental impairment. Claiborne Bowman tells me she did not eat much - she believes this was due to her excessive ETOH use and stimulant use.    We discussed her current illness and what it means in the larger context of her on-going co-morbidities.  Natural disease trajectory and expectations at EOL were discussed. Claiborne Bowman has a good understanding of her mother's disease. We talked about one of her most concerning conditions is her failure to thrive - significantly decreased intake. We discussed prognosis - that it is uncertain - discussed really depends on how patient does in the coming days and if her appetite improves. Discussed that months would not be surprising.   I attempted to elicit values and goals of care important to the patient.  The difference between aggressive medical intervention and comfort care was considered in light of the patient's goals of care. Advance directives, concepts specific to code status, artifical feeding and hydration, and rehospitalization were considered and discussed. Claiborne Bowman shares that her grandmother had a DNR and her mother always told Claiborne Bowman she would want the same - she would never want CPR or want to be on a ventilator. Claiborne Bowman shares she knows these are her mother's wishes but they are Sheila Bowman's wishes as well for her mother as she does not think CPR and mechanical ventilation would ultimately benefit her mother  but would only prolong suffering - I expressed agreement.   Hospice and Palliative Care services outpatient were explained and offered. Discussed that patient would likely be eligible for hospice services; however she is not eligible for both rehab and  hospice. Discussed outpatient palliative to see patient at SNF - Claiborne Bowman is agreeable to this.   Questions and concerns were addressed. The family was encouraged to call with questions or concerns.   Sheila Bowman plans to update her brother on the above conversation and give him my number in case he has any further questions or concerns.   Primary Decision Maker NEXT OF KIN - 2 children (patient unable to make her own medical decisions)    SUMMARY OF RECOMMENDATIONS   - code status changed to DNR - signed DNR placed on chart - will ask TOC to refer to outpatient palliative wherever she is placed - awaiting children to obtain legal guardianship - will continue to follow along/support family  Code Status/Advance Care Planning:  DNR   Symptom Management:   Trial of megace per dietician/Dr. Posey Bowman  Palliative Prophylaxis:   Aspiration, Delirium Protocol, Frequent Pain Assessment, Oral Care and Turn Reposition  Prognosis:   Poor prognosis - months?  Discharge Planning: [place] for rehab with Palliative care service follow-up      Primary Diagnoses: Present on Admission: . Acute metabolic encephalopathy . Alcoholic cirrhosis of liver without ascites ([place]) . Alcohol abuse with alcohol-induced disorder ([place]) . Hypokalemia . Essential hypertension   I have reviewed the medical record, interviewed the patient and family, and examined the patient. The following aspects are pertinent.  Past Medical History:  Diagnosis Date  . Diabetes mellitus without complication ([place])   . Hypertension    Social History   Socioeconomic History  . Marital status: Single    Spouse name: Not on file  . Number of children: Not on file  . Years of education: Not on file  . Highest education level: Not on file  Occupational History  . Not on file  Tobacco Use  . Smoking status: Former Research scientist (life sciences)  . Smokeless tobacco: Never Used  Substance and Sexual Activity  . Alcohol use: Yes     Comment: daily  . Drug use: Never  . Sexual activity: Not Currently  Other Topics Concern  . Not on file  Social History Narrative  . Not on file   Social Determinants of Health   Financial Resource Strain:   . Difficulty of Paying Living Expenses: Not on file  Food Insecurity:   . Worried About Charity fundraiser in the Last Year: Not on file  . Ran Out of Food in the Last Year: Not on file  Transportation Needs:   . Lack of Transportation (Medical): Not on file  . Lack of Transportation (Non-Medical): Not on file  Physical Activity:   . Days of Exercise per Week: Not on file  . Minutes of Exercise per Session: Not on file  Stress:   . Feeling of Stress : Not on file  Social Connections:   . Frequency of Communication with Friends and Family: Not on file  . Frequency of Social Gatherings with Friends and Family: Not on file  . Attends Religious Services: Not on file  . Active Member of Clubs or Organizations: Not on file  . Attends Archivist Meetings: Not on file  . Marital Status: Not on file   History reviewed. No pertinent family history. Scheduled Meds: . feeding supplement (ENSURE  ENLIVE)  237 mL Oral BID BM  . folic acid  1 mg Oral Daily  . hydrochlorothiazide  12.5 mg Oral Daily  . irbesartan  300 mg Oral Daily  . lactulose  20 g Oral Daily  . megestrol  300 mg Oral BID  . multivitamin with minerals  1 tablet Oral Daily  . potassium chloride  40 mEq Oral Daily  . sodium chloride flush  3 mL Intravenous Q12H  . thiamine  250 mg Oral Daily   Continuous Infusions: PRN Meds:.acetaminophen **OR** acetaminophen, haloperidol, LORazepam **OR** LORazepam, ondansetron **OR** ondansetron (ZOFRAN) IV No Known Allergies Review of Systems  Unable to perform ROS: Mental status change    Physical Exam Constitutional:      General: She is not in acute distress.    Comments: Sleeping - does not wake to verbal stimulation  HENT:     Head: Normocephalic and  atraumatic.  Cardiovascular:     Rate and Rhythm: Normal rate.  Pulmonary:     Effort: Pulmonary effort is normal. No respiratory distress.  Abdominal:     Palpations: Abdomen is soft.  Musculoskeletal:     Right lower leg: No edema.     Left lower leg: No edema.  Skin:    General: Skin is warm and dry.  Neurological:     Mental Status: She is disoriented.     Vital Signs: BP (!) 157/68 (BP Location: Left Arm)   Pulse 84   Temp 97.9 F (36.6 C) (Axillary)   Resp 16   Ht _0  (1.676 m)   Wt 62.3 kg   SpO2 94%   BMI 22.17 kg/m  Pain Scale: PAINAD POSS *See Group Information*: S-Acceptable,Sleep, easy to arouse Pain Score: Asleep   SpO2: SpO2: 94 % O2 Device:SpO2: 94 % O2 Flow Rate: .   IO: Intake/output summary: No intake or output data in the 24 hours ending 02/23/20 1225  LBM: Last BM Date: (unknown) Baseline Weight: Weight: 56.7 kg Most recent weight: Weight: 62.3 kg     Palliative Assessment/Data: PPS 20% d/t PO intake    Time Total: 70 minutes Greater than 50%  of this time was spent counseling and coordinating care related to the above assessment and plan.  Juel Burrow, DNP, AGNP-C Palliative Medicine Team [phone] Pager: [phone]

## 2020-02-23 NOTE — Progress Notes (Signed)
Patient ID: Sheila Bowman, female   DOB: February 24, 1951, 69 y.o.   MRN: [id number]  Per social worker patient does not have any bed office will rehab in the area. Offer [place]. No luck so far.  Start Megace for poor appetite. Overall put patient care is a poor prognosis. I will place palliative care consultation-- discuss goals of care with patient and family

## 2020-02-23 NOTE — Plan of Care (Signed)
Pt resting quietly and sleeping for long periods throughout the shift.  Family at bedside for emotional support and assisted with some ADLs.  Pt is calm and cooperative when aroused.  Telesitter was discontinued at 0800. CIWA score has been zero throughout shift.   Problem: Education: Goal: Knowledge of General Education information will improve Description: Including pain rating scale, medication(s)/side effects and non-pharmacologic comfort measures Outcome: Adequate for Discharge   Problem: Health Behavior/Discharge Planning: Goal: Ability to manage health-related needs will improve Outcome: Adequate for Discharge   Problem: Clinical Measurements: Goal: Ability to maintain clinical measurements within normal limits will improve Outcome: Adequate for Discharge Goal: Will remain free from infection Outcome: Adequate for Discharge Goal: Diagnostic test results will improve Outcome: Adequate for Discharge Goal: Respiratory complications will improve Outcome: Adequate for Discharge Goal: Cardiovascular complication will be avoided Outcome: Adequate for Discharge   Problem: Activity: Goal: Risk for activity intolerance will decrease Outcome: Adequate for Discharge   Problem: Nutrition: Goal: Adequate nutrition will be maintained Outcome: Adequate for Discharge   Problem: Coping: Goal: Level of anxiety will decrease Outcome: Adequate for Discharge   Problem: Elimination: Goal: Will not experience complications related to bowel motility Outcome: Adequate for Discharge Goal: Will not experience complications related to urinary retention Outcome: Adequate for Discharge   Problem: Pain Managment: Goal: General experience of comfort will improve Outcome: Adequate for Discharge   Problem: Safety: Goal: Ability to remain free from injury will improve Outcome: Adequate for Discharge   Problem: Skin Integrity: Goal: Risk for impaired skin integrity will decrease Outcome:  Adequate for Discharge   Problem: Increased Nutrient Needs (NI-5.1) Goal: Food and/or nutrient delivery Description: Individualized approach for food/nutrient provision. Outcome: Adequate for Discharge   Problem: Acute Rehab PT Goals(only PT should resolve) Goal: Pt Will Go Supine/Side To Sit Outcome: Adequate for Discharge Goal: Pt Will Transfer Bed To Chair/Chair To Bed Outcome: Adequate for Discharge Goal: Pt Will Ambulate Outcome: Adequate for Discharge Goal: Pt Will Go Up/Down Stairs Outcome: Adequate for Discharge

## 2020-02-23 NOTE — Progress Notes (Addendum)
Sheila Bowman: Sheila Bowman    MR#:  [id number]  DATE OF BIRTH:  11-18-1951  SUBJECTIVE:  pt sleeping at present. Has not required any Ativan >24 hours Confusion at baseline REVIEW OF SYSTEMS:   Review of Systems  Unable to perform ROS: Mental status change   Tolerating Diet:yes Tolerating PT: rehab  DRUG ALLERGIES:  No Known Allergies  VITALS:  Blood pressure (!) 104/52, pulse 79, temperature 98.1 F (36.7 C), temperature source Oral, resp. rate 16, height '5\' 6"'$  (1.676 m), weight 62.3 kg, SpO2 92 %.  PHYSICAL EXAMINATION:   Physical ExamLimited exam  GENERAL:  69 y.o.-year-old patient lying in the bed with no acute distress. Disheveled, resting EYES: Pupils equal, round, reactive to light and accommodation. No scleral icterus.   HEENT: Head atraumatic, normocephalic. Oropharynx and nasopharynx clear.  NECK:  Supple, no jugular venous distention. No thyroid enlargement, no tenderness.  LUNGS: Normal breath sounds bilaterally, no wheezing, rales, rhonchi. No use of accessory muscles of respiration.  CARDIOVASCULAR: S1, S2 normal. No murmurs, rubs, or gallops.  ABDOMEN: Soft, nontender, nondistended. Bowel sounds present. No organomegaly or mass.  EXTREMITIES: No cyanosis, clubbing or edema b/l.    NEUROLOGIC: grossly nonfocal. Moves all extremities well PSYCHIATRIC:  patient is sleepy SKIN: No obvious rash, lesion, or ulcer.--per RN  LABORATORY PANEL:  CBC Recent Labs  Lab 02/19/20 0459  WBC 4.5  HGB 8.9*  HCT 27.7*  PLT 46*    Chemistries  Recent Labs  Lab 02/22/20 0501  NA 144  K 3.9  CL 109  CO2 28  GLUCOSE 92  BUN 6*  CREATININE 0.61  CALCIUM 8.9  MG 1.8  AST 60*  ALT 28  ALKPHOS 96  BILITOT 5.5*   Cardiac Enzymes No results for input(s): TROPONINI in the last 168 hours. RADIOLOGY:  No results found. ASSESSMENT AND PLAN:  68 year old woman PMH of alcohol abuse, cirrhosis, found in  parking lot confused.  ED.  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  Serum alcohol was positive although patient denied intake.  Acute on chronic metabolic encephalopathy, daily alcohol abuse, reported longstanding hallucinations, confusion concerning for  Wernicke-Korsakoff.   -CT head no acute abnormalities.  MRI brain unrevealing.  EEG was negative.   -TSH, B12, ESR, CRP within normal limits.  Ceruloplasmin borderline low but unremarkable per neurology --recieved high-dose IV thiamine for 3 days --change to po 250 mg thiamine for 5 days per neurology rec ---ammonia down to 25 --cont daily lactulose --Psychiatry involved--does not feel pt need inpt behavioral med transfer. --pt has been relatively calm and not required ativan > 24 hours -d/c sitter -cont prn safety rounds  Alcohol abuse--chronic --no signs of acute withdrawal  At present - Continue  CIWA.   -d/csitte --Magnesium and potassium repleted  Alcoholic cirrhosis without ascites, with hyperammonemia, thrombocytopenia.   -Possible alcoholic hepatitis.  Ammonia level only modestly elevated at 48 on admission--25 --continue lactulose --Has been referred in the past to the liver clinic but has not followed up. --Discriminant function was borderline at 33.8 but total bilirubin is improving.  Discussed with gastroenterology.  No indication for steroids at this time given improvement.  Meld score 19.  Anemia of chronic disease, stable  Diabetes mellitus type 2 diet controlled --One episode of hypoglycemia last evening probably secondary to poor oral intake.  Essential hypertension --Stable.  Continue hydrochlorothiazide and ARB  ADD. On  methylphenidate as an outpatient, daughter concerned that patient might be over taking this medication --Methylphenidate discontinued permanently.  Adult failure to thrive, poor po nutrition, at risk  for re-feeding syndrome -d/w dietitian start po megace and cont nutritional supplements -staff to encourage pt to eat   Procedures:none Family communication : dter [name] aware Consults :Psych, neurology Discharge Disposition : SNF CODE STATUS:  Full DVT Prophylaxis :SCD--low plts Discharge Disposition: to rehab once bed available  TOTAL TIME TAKING CARE OF THIS PATIENT: *25* minutes.  >50% time spent on counselling and coordination of care  Note: This dictation was prepared with Dragon dictation along with smaller phrase technology. Any transcriptional errors that result from this process are unintentional.  Sheila Bowman M.D    [facility]   CC: Primary care physician; [facility] MedicalPatient ID: Sheila Bowman, female   DOB: 02-Jun-1951, 69 y.o.   MRN: [id number]

## 2020-02-23 NOTE — Progress Notes (Signed)
Nutrition Follow Up Note   DOCUMENTATION CODES:   Not applicable  INTERVENTION:   Ensure Enlive po BID, each supplement provides 350 kcal and 20 grams of protein  MVI, thiamine and folic acid in setting of etoh abuse   Megace '300mg'$  po BID initiated   Pt likely at high refeed risk; recommend monitor K, Mg and P labs daily until stable  NUTRITION DIAGNOSIS:   Increased nutrient needs related to chronic illness(cirrhosis) as evidenced by increased estimated needs.  GOAL:   Patient will meet greater than or equal to 90% of their needs  -not met   MONITOR:   PO intake, Supplement acceptance, Labs, Weight trends, Skin, I & O's  ASSESSMENT:   69 y.o. female, with history of A. fib on anticoagulation, chronic pancreatic insufficiency, chronic back pain COPD, rheumatoid arthritis on methotrexate and [place] who was brought with confusion.  Pt continues to have poor appetite and oral intake in hospital. Per RN report, pt sleeping all morning. Will plan to add Megace to see if pt's appetite will improve. Pt is ordered for Ensure supplements. RD will continue to monitor oral intake. RD unsure if pt would tolerate NGT tube r/t confusion. Pt is at high refeed risk secondary to etoh abuse. No new weight since; will request weekly weights.   Medications reviewed and include: folic acid, lactulose, MVI, KCl, thiamine  Labs reviewed: K 3.9 wnl, P 3.7 wnl, Mg 1.8 wnl Ammonia 25 Hgb 8.9(L), Hct 27.7(L)  Diet Order:   Diet Order            Diet regular Room service appropriate? Yes; Fluid consistency: Thin  Diet effective now             EDUCATION NEEDS:   No education needs have been identified at this time  Skin:  Skin Assessment: Reviewed RN Assessment  Last BM:  2/20- type 7  Height:   Ht Readings from Last 1 Encounters:  02/18/20 '5\' 6"'$  (1.676 m)    Weight:   Wt Readings from Last 1 Encounters:  02/18/20 62.3 kg    Ideal Body Weight:  59 kg  BMI:  Body mass index  is 22.17 kg/m.  Estimated Nutritional Needs:   Kcal:  1500-1700kcal/day  Protein:  75-85g/day  Fluid:  >1.5L/day  Koleen Distance MS, RD, LDN Contact information available in Amion

## 2020-02-23 NOTE — TOC Progression Note (Signed)
Transition of Care [facility]) - Progression Note    Patient Details  Name: Sheila Bowman MRN: [id number] Date of Birth: 08/04/1951  [facility] CM/SW Contact  Maree Krabbe, LCSW Phone Number: 02/23/2020, 12:46 PM  Clinical Narrative:   Pt has no bed offers at this time. CSW will reach out to [facility] in [place]. Pt will follow up with family.    Expected Discharge Plan: Skilled Nursing Facility Barriers to Discharge: Barriers Resolved  Expected Discharge Plan and Services Expected Discharge Plan: Skilled Nursing Facility   Discharge Planning Services: CM Consult Post Acute Care Choice: Skilled Nursing Facility Living arrangements for the past 2 months: Single Family Home                                       Social Determinants of Health (SDOH) Interventions    Readmission Risk Interventions No flowsheet data found.

## 2020-02-23 NOTE — Progress Notes (Signed)
Patient ID: Sheila Bowman, female   DOB: 10/18/1951, 69 y.o.   MRN: [id number] Spoke with Dter [name] and updated

## 2020-02-24 LAB — BASIC METABOLIC PANEL
Anion gap: 6 (ref 5–15)
BUN: 11 mg/dL (ref 8–23)
CO2: 26 mmol/L (ref 22–32)
Calcium: 8.9 mg/dL (ref 8.9–10.3)
Chloride: 110 mmol/L (ref 98–111)
Creatinine, Ser: 0.63 mg/dL (ref 0.44–1.00)
GFR calc Af Amer: >60 mL/min (ref >60)
GFR calc non Af Amer: >60 mL/min (ref >60)
Glucose, Bld: 84 mg/dL (ref 70–99)
Potassium: 4.1 mmol/L (ref 3.5–5.1)
Sodium: 142 mmol/L (ref 135–145)

## 2020-02-24 LAB — VITAMIN B1: Vitamin B1 (Thiamine): 186.8 nmol/L (ref 66.5–200.0)

## 2020-02-24 LAB — GLUCOSE, CAPILLARY
Glucose-Capillary: 76 mg/dL (ref 70–99)
Glucose-Capillary: 98 mg/dL (ref 70–99)
Glucose-Capillary: 138 mg/dL — ABNORMAL HIGH (ref 70–99)
Glucose-Capillary: 135 mg/dL — ABNORMAL HIGH (ref 70–99)

## 2020-02-24 LAB — PHOSPHORUS: Phosphorus: 3.8 mg/dL (ref 2.5–4.6)

## 2020-02-24 LAB — MAGNESIUM: Magnesium: 2 mg/dL (ref 1.7–2.4)

## 2020-02-24 NOTE — Progress Notes (Signed)
Daily Progress Note   Patient Name: Sheila Bowman       Date: 02/24/2020 DOB: 05-22-1951  Age: 69 y.o. MRN#: [id number] Attending Physician: Fritzi Mandes, MD Primary Care Physician: Burns Date: 02/18/2020  Reason for Consultation/Follow-up: Establishing goals of care  Subjective: Patient nods head after significant verbal/physical stimulation then goes back to sleep. Family not at bedside.  Length of Stay: 5  Current Medications: Scheduled Meds:   feeding supplement (ENSURE ENLIVE)  237 mL Oral BID BM   folic acid  1 mg Oral Daily   hydrochlorothiazide  12.5 mg Oral Daily   irbesartan  300 mg Oral Daily   lactulose  20 g Oral Daily   megestrol  300 mg Oral BID   multivitamin with minerals  1 tablet Oral Daily   potassium chloride  40 mEq Oral Daily   sodium chloride flush  3 mL Intravenous Q12H   thiamine  250 mg Oral Daily    Continuous Infusions:   PRN Meds: acetaminophen **OR** acetaminophen, ondansetron **OR** ondansetron (ZOFRAN) IV  Physical Exam          Vital Signs: BP (!) 142/67 (BP Location: Left Arm)    Pulse 79    Temp 98.1 F (36.7 C) (Oral)    Resp 17    Ht 5\' 6"  (1.676 m)    Wt 53.9 kg    SpO2 94%    BMI 19.19 kg/m  SpO2: SpO2: 94 % O2 Device: O2 Device: Room Air O2 Flow Rate:    Intake/output summary:   Intake/Output Summary (Last 24 hours) at 02/24/2020 1253 Last data filed at 02/24/2020 0930 Gross per 24 hour  Intake 183 ml  Output 351 ml  Net -168 ml   LBM: Last BM Date: (patient unable to report.) Baseline Weight: Weight: 56.7 kg Most recent weight: Weight: 53.9 kg       Palliative Assessment/Data:    Flowsheet Rows     Most Recent Value  Intake Tab  Referral Department  Hospitalist  Unit at Time of  Referral  Cardiac/Telemetry Unit  Palliative Care Primary Diagnosis  Neurology  Date Notified  02/23/20  Palliative Care Type  New Palliative care  Reason for referral  Clarify Goals of Care  Date of Admission  02/18/20  Date first seen by Palliative Care  02/23/20  # of days Palliative referral response time  0 Day(s)  # of days IP prior to Palliative referral  5  Clinical Assessment  Palliative Performance Scale Score  20%  Psychosocial & Spiritual Assessment  Palliative Care Outcomes  Patient/Family meeting held?  Yes  Who was at the meeting?  daughter  Palliative Care Outcomes  Clarified goals of care, Counseled regarding hospice, Provided psychosocial or spiritual support, Changed CPR status, Completed durable DNR, Linked to palliative care logitudinal support, Provided advance care planning      Patient Active Problem List   Diagnosis Date Noted   Goals of care, counseling/discussion    DNR (do not resuscitate)    Palliative care by specialist    Failure to thrive in adult    Acute metabolic encephalopathy 77/82/4235   Alcoholic cirrhosis of liver without ascites ([place]) 02/18/2020  Alcohol abuse with alcohol-induced disorder (HCC) 02/18/2020   Hypokalemia 02/18/2020   Type 2 diabetes mellitus without complication (HCC) 02/18/2020   Essential hypertension 02/18/2020    Palliative Care Assessment & Plan   HPI: 69 y.o. female  with past medical history of ongoing ETOH use, cirrhosis, diastolic CHF, HTN, T2DM, and ADHD admitted on 02/18/2020 with AMS - found confused in [employer] parking lot. Per family, increasing confusion and hallucinations for past 8-9 months. Multiple falls at home.  She has been referred to hepatology for cirrhosis but has not follow up. During hospitalization she has been treated with ativan following CIWA protocol. Patient has symptoms concerning for Wernicke- Korsakoff syndrome. CT, MRI, and EEG all negative. Patient also with poor nutrition -  megace initiated. PMT consulted for GOC.  Assessment: Sheila Bowman to give her update. She shares patient ate about 50% of lunch from [employer] yesterday but needed significant amount of support - she would fall asleep after every bite.   RN was able to get Megace in patient.  Sheila Bowman expressed some distress about situation - therapeutic listening and emotional support provided.   Family's biggest concern is disposition - they feel patient will be unsafe at home and are hopeful SNF placement can be obtained for Sheila Bowman.   Called by patient's son Sheila Bowman - he expressed the above concerns. He also asks for clinical update and this was provided. All questions addressed.   Recommendations/Plan:  Waiting placement - family hopeful for SNF  Palliative outpatient to follow wherever she is placed  Maintain DNR status per conversation with daughter 2/23  PMT will continue to follow/support family.  Code Status:  DNR  Prognosis:  Difficult to say - months? Depends on PO intake - could be shorter if intake does not improve - at risk for acute event  Discharge Planning:  Skilled Nursing Facility for rehab with Palliative care service follow-up depending on bed offers  Care plan was discussed with patient's children Sheila Bowman and Sheila Bowman, Dr. Allena Katz, CSW  Thank you for allowing the Palliative Medicine Team to assist in the care of this patient.   Total Time 45 minutes Prolonged Time Billed  no       Greater than 50%  of this time was spent counseling and coordinating care related to the above assessment and plan.  Gerlean Ren, DNP, [facility] Palliative Medicine Team Team Phone # [phone]  Pager [phone]

## 2020-02-24 NOTE — Progress Notes (Addendum)
[facility] [place]: Sheila Bowman    MR#:  [id number]  DATE OF BIRTH:  September 12, 1951  SUBJECTIVE:  pt sleeping earlier. Woke up with RN in the room. Got her to bedside commode. Her breakfast has been set up for her to eat. Remains confused at baseline. No agitation and has not required Ativan  For > 2days REVIEW OF SYSTEMS:   Review of Systems  Unable to perform ROS: Mental acuity   Tolerating Diet:yes Tolerating PT: rehab  DRUG ALLERGIES:  No Known Allergies  VITALS:  Blood pressure (!) 142/67, pulse 79, temperature 98.1 F (36.7 C), temperature source Oral, resp. rate 17, height '5\' 6"'  (1.676 m), weight 53.9 kg, SpO2 94 %.  PHYSICAL EXAMINATION:   Physical ExamLimited exam  GENERAL:  69 y.o.-year-old patient lying in the bed with no acute distress. Disheveled EYES: Pupils equal, round, reactive to light and accommodation. No scleral icterus.   HEENT: Head atraumatic, normocephalic. Oropharynx and nasopharynx clear.  NECK:  Supple, no jugular venous distention. No thyroid enlargement, no tenderness.  LUNGS: Normal breath sounds bilaterally, no wheezing, rales, rhonchi. No use of accessory muscles of respiration.  CARDIOVASCULAR: S1, S2 normal. No murmurs, rubs, or gallops.  ABDOMEN: Soft, nontender, nondistended. Bowel sounds present. No organomegaly or mass.  EXTREMITIES: No cyanosis, clubbing or edema b/l.    NEUROLOGIC: grossly nonfocal. Moves all extremities well, sits up at bedside PSYCHIATRIC:  patient is awake and alert SKIN: No obvious rash, lesion, or ulcer--per RN  LABORATORY PANEL:  CBC Recent Labs  Lab 02/19/20 0459  WBC 4.5  HGB 8.9*  HCT 27.7*  PLT 46*    Chemistries  Recent Labs  Lab 02/22/20 0501 02/22/20 0501 02/24/20 0650  NA 144   < > 142  K 3.9   < > 4.1  CL 109   < > 110  CO2 28   < > 26  GLUCOSE 92   < > 84  BUN 6*   < > 11  CREATININE 0.61   < > 0.63  CALCIUM 8.9   < > 8.9  MG 1.8    < > 2.0  AST 60*  --   --   ALT 28  --   --   ALKPHOS 96  --   --   BILITOT 5.5*  --   --    < > = values in this interval not displayed.   Cardiac Enzymes No results for input(s): TROPONINI in the last 168 hours. RADIOLOGY:  No results found. ASSESSMENT AND PLAN:  69 year old woman PMH of alcohol abuse, cirrhosis, found in parking lot confused.  ED.  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  Serum alcohol was positive although patient denied intake.  Acute on chronic metabolic encephalopathy, daily alcohol abuse, reported longstanding hallucinations, confusion concerning for  Wernicke-Korsakoff.   -CT head no acute abnormalities.  MRI brain unrevealing.  EEG was negative.   -TSH, B12, ESR, CRP within normal limits.   --recieved high-dose IV thiamine for 3 days --change to po 250 mg thiamine for 5 days per neurology rec ---ammonia down to 25 --cont daily lactulose --Psychiatry involved--does not feel pt need inpt behavioral med transfer. --pt has been relatively calm and not required ativan > 24 hours -pt not requiring ativan -cont prn safety rounds  Alcohol abuse--chronic --no signs of acute withdrawal  At present --Magnesium and potassium repleted  Alcoholic cirrhosis  without ascites, with hyperammonemia, thrombocytopenia.   -Possible alcoholic hepatitis.  Ammonia level only modestly elevated at 48 on admission--25 --continue lactulose --Has been referred in the past to the liver clinic but has not followed up. --Discriminant function was borderline at 33.8 but total bilirubin is improving.  Discussed with gastroenterology.  No indication for steroids at this time given improvement.  Meld score 19.  Anemia of chronic disease, stable  Diabetes mellitus type 2 diet controlled -sugars stable  Essential hypertension --Stable.  Continue hydrochlorothiazide and ARB  ADD. On  methylphenidate as an outpatient, daughter concerned that patient might be over taking this medication --Methylphenidate discontinued permanently.  Adult failure to thrive, poor po nutrition, at risk for re-feeding syndrome -d/w dietitian start po megace and cont nutritional supplements -staff to encourage pt to eat -Seen by palliative care--pt is DNR - long term poor prognosis  Procedures:none Family communication : dter [name] on the phone today Consults :Psych, neurology Discharge Disposition : SNF CODE STATUS:  DNR DVT Prophylaxis :SCD--low plts Discharge Disposition: Per TOC awaiting to see if rehab places can offer now that pt is not requiring sitter. If no beds offered today then the only choice is home with [facility]. D/w dter about discharging today if SNF plan does not work out and she is worried about how she will manage pt at home. I am hoping rehab can offer her bed!   TOTAL TIME TAKING CARE OF THIS PATIENT: *25* minutes.  >50% time spent on counselling and coordination of care  Note: This dictation was prepared with Dragon dictation along with smaller phrase technology. Any transcriptional errors that result from this process are unintentional.  Fritzi Mandes M.D    [facility]   CC: Primary care physician; [facility] MedicalPatient ID: Franky Macho, female   DOB: [birth year], 69 y.o.   MRN: [id number]

## 2020-02-24 NOTE — Progress Notes (Signed)
Physical Therapy Treatment Patient Details Name: Sheila Bowman MRN: [id number] DOB: August 09, 1951 Today's Date: 02/24/2020    History of Present Illness presented to ER secondary to AMS, hallucinations, after being found at 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    PT Comments    Pt was supine in bed upon arriving with daughter present. Pt is A and oriented to self and location however unable to correctly state time/day/ reason for being in hospital. Lengthy discussion with daughter about safety and d/c recommendations. Pt's daughter aware/ understands pt might have to d/c to her home." Not a perfect situation but we will make it work." Pt agrees to [employer] activity. She was able to exit L side of bed with min-mod assist. Ataxic/ uncoordinated movements throughout session. She was able to stand with Min assist + gait belt+ vcs for safety, technique, and sequencing. Ambulated to doorway and returned back to bed. Very slow gait with inconsistent gait kinematics. Daughter present throughout and therapist issued gait belt for future use. Per daughter, pt has had similar gait pattern over past year and reports 4 falls during this time. Pt was repositioned in bed post session with bed alarm set, daughter at bedside and RN aware of safety concerns. Therapist discussed barriers daughter may face if pt is to D/C to home. Therapist recommends RW and 3 and 1 if pt is unable to go to SNF. PT will continue to follow pt per POC and progress as able.      Follow Up Recommendations  SNF     Equipment Recommendations  Rolling walker with 5" wheels;3in1 (PT)(If pt has to D/C to daughters)    Recommendations for Other Services       Precautions / Restrictions Precautions Precautions: Fall Restrictions Weight Bearing Restrictions: No    Mobility  Bed Mobility Overal bed mobility: Needs Assistance Bed Mobility: Supine to Sit      Supine to sit: Min assist;Mod assist     General bed mobility comments: min-mod assist to achieve EOB sitting from long sitting. Pt with ataxic like movements but once seated EOB resolved slightly.  Transfers Overall transfer level: Needs assistance Equipment used: Rolling walker (2 wheeled) Transfers: Sit to/from Stand Sit to Stand: Min assist         General transfer comment: Min assist to safely stand from EOB. vcs throughout for safety, technique, and sequencing. Gait belt used throughout session for safety  Ambulation/Gait Ambulation/Gait assistance: Min assist Gait Distance (Feet): 30 Feet Assistive device: Rolling walker (2 wheeled) Gait Pattern/deviations: Ataxic;Drifts right/left;Wide base of support;Leaning posteriorly Gait velocity: decreased   General Gait Details: Pt very inconsistant with gait pattern. she staggers L/R and required constant vcs for safety and improved performance. Per daughter, pt has had " weird" gait pattern for over a year. Therapist issued gait belt for daughter. Pt tolerated gait well but is very unsteady on her feet.   Stairs             Wheelchair Mobility    Modified Rankin (Stroke Patients Only)       Balance                                            Cognition Arousal/Alertness: Awake/alert Behavior During Therapy: Impulsive Overall Cognitive Status: Impaired/Different from baseline Area of Impairment: Orientation;Attention;Safety/judgement;Awareness;Problem solving;Following commands  Orientation Level: Disoriented to;Person;Place Current Attention Level: Selective   Following Commands: Follows one step commands inconsistently Safety/Judgement: Decreased awareness of safety   Problem Solving: Slow processing General Comments: Pt was oriented to self and setting however unable to recall why she is in hospital or events that lead up to hospitalization. Pt's daughter in room  and very pleasant/understanding of situation. Per pt's daughter, Cognition deficits at baseline but not as poor as current situation.       Exercises      General Comments        Pertinent Vitals/Pain Pain Assessment: No/denies pain    Home Living                      Prior Function            PT Goals (current goals can now be found in the care plan section) Progress towards PT goals: Progressing toward goals    Frequency    Min 2X/week      PT Plan Current plan remains appropriate    Co-evaluation              AM-PAC PT "6 Clicks" Mobility   Outcome Measure  Help needed turning from your back to your side while in a flat bed without using bedrails?: A Lot Help needed moving from lying on your back to sitting on the side of a flat bed without using bedrails?: A Lot Help needed moving to and from a bed to a chair (including a wheelchair)?: A Lot Help needed standing up from a chair using your arms (e.g., wheelchair or bedside chair)?: A Lot Help needed to walk in hospital room?: A Lot Help needed climbing 3-5 steps with a railing? : A Lot 6 Click Score: 12    End of Session Equipment Utilized During Treatment: Gait belt Activity Tolerance: Patient tolerated treatment well Patient left: in bed;with call bell/phone within reach;with bed alarm set;with family/visitor present Nurse Communication: Mobility status PT Visit Diagnosis: Muscle weakness (generalized) (M62.81);Difficulty in walking, not elsewhere classified (R26.2);Other symptoms and signs involving the nervous system (R29.898)     Time: 5427-0623 PT Time Calculation (min) (ACUTE ONLY): 23 min  Charges:  $Gait Training: 8-22 mins $Therapeutic Activity: 8-22 mins                     Jetta Lout PTA 02/24/20, 3:43 PM

## 2020-02-24 NOTE — Progress Notes (Signed)
Patient ID: Sheila Bowman, female   DOB: October 04, 1951, 69 y.o.   MRN: [id number]  Discussed with social worker [facility] in [place] earlier offered bed to accept patient on Friday however facility backed off. Facility in [place] has denied. No other bed offers present. Patient will benefit from rehab however it appears to be a tough situation. CSW has [place] today.  Daughter [name] aware if no facilities accsets then the only choice left will be home with home health tomorrow.  D/w palliative care

## 2020-02-24 NOTE — TOC Initial Note (Signed)
Transition of Care [facility]) - Initial/Assessment Note    Patient Details  Name: Sheila Bowman MRN: [id number] Date of Birth: [birth year]  [facility] of [facility]) CM/SW Contact:    Eileen Stanford, LCSW Phone Number: 02/24/2020, 9:53 AM  Clinical Narrative: CSW spoke with pt's daughter via telephone. Pt lives at home alone. Pt's daughter states pt really needs to go to SNF however, daughter is aware that pt requiring a sitter in early admission may cause SNF's to decline pt. CSW will continue to reach out to SNF's to determine if they will take pt, now that pt is no longer requiring a sitter.   Pt's daughter states it no facility will take pt she will take pt home with her and would like [facility]. However, pt's daughter emphasized that it is safer for pt to go to SNF.                   Expected Discharge Plan: Skilled Nursing Facility Barriers to Discharge: Continued Medical Work up, No SNF bed   Patient Goals and CMS Choice Patient states their goals for this hospitalization and ongoing recovery are:: for pt to go to SNF- per pt's daughter   Choice offered to / list presented to : Adult Children  Expected Discharge Plan and Services Expected Discharge Plan: [place] In-house Referral: Clinical Social Work(SA counseling, however pt is not alert and oriented) Discharge Planning Services: CM Consult Post Acute Care Choice: [place] arrangements for the past 2 months: [place]                                      Prior Living Arrangements/Services Living arrangements for the past 2 months: Single Family Home Lives with:: Self Patient language and need for interpreter reviewed:: Yes Do you feel safe going back to the place where you live?: Yes   Patient was unable to answer question coherently. Daughter stated she is concerned about her mother living on her own and she thinks assited living would be appropiate.  Need for  Family Participation in Patient Care: Yes (Comment) Care giver support system in place?: Yes (comment)   Criminal Activity/Legal Involvement Pertinent to Current Situation/Hospitalization: No - Comment as needed  Activities of Daily Living Home Assistive Devices/Equipment: None ADL Screening (condition at time of admission) Patient's cognitive ability adequate to safely complete daily activities?: Yes Is the patient deaf or have difficulty hearing?: No Does the patient have difficulty seeing, even when wearing glasses/contacts?: No Does the patient have difficulty concentrating, remembering, or making decisions?: No Patient able to express need for assistance with ADLs?: Yes Does the patient have difficulty dressing or bathing?: No Independently performs ADLs?: Yes (appropriate for developmental age) Does the patient have difficulty walking or climbing stairs?: Yes Weakness of Legs: Both Weakness of Arms/Hands: None  Permission Sought/Granted Permission sought to share information with : Family Supports Permission granted to share information with : Yes, Verbal Permission Granted  Share Information with NAME: kelly  Permission granted to share info w AGENCY: [facility]  Permission granted to share info w Relationship: Daughter     Emotional Assessment Appearance:: Appears stated age Attitude/Demeanor/Rapport: Unable to Assess Affect (typically observed): Unable to Assess Orientation: : Oriented to Self, Oriented to Place Alcohol / Substance Use: Not Applicable Psych Involvement: No (comment)  Admission diagnosis:  Confusion [R41.0] Encephalopathy [X32.44] Acute metabolic encephalopathy [  G93.41] Patient Active Problem List   Diagnosis Date Noted  . Goals of care, counseling/discussion   . DNR (do not resuscitate)   . Palliative care by specialist   . Failure to thrive in adult   . Acute metabolic encephalopathy 02/18/2020  . Alcoholic cirrhosis of liver without ascites (HCC)  02/18/2020  . Alcohol abuse with alcohol-induced disorder (HCC) 02/18/2020  . Hypokalemia 02/18/2020  . Type 2 diabetes mellitus without complication (HCC) 02/18/2020  . Essential hypertension 02/18/2020   PCP:  [facility]:   [facility] - [place], [place] - [address] [place] [address] Phone: [phone] Fax: [phone]  [facility] [address] - [place], [place] - [address] [place] [address] Phone: [phone] Fax: [phone]     Social Determinants of Health (SDOH) Interventions    Readmission Risk Interventions No flowsheet data found.

## 2020-02-24 NOTE — Consult Note (Signed)
PHARMACY CONSULT NOTE - FOLLOW UP  Pharmacy Consult for Electrolyte Monitoring and Replacement   Recent Labs: Potassium (mmol/L)  Date Value  02/24/2020 4.1   Magnesium (mg/dL)  Date Value  84/12/8207 2.0   Calcium (mg/dL)  Date Value  13/88/7195 8.9   Albumin (g/dL)  Date Value  97/47/1855 2.8 (L)   Phosphorus (mg/dL)  Date Value  01/58/6825 3.8   Sodium (mmol/L)  Date Value  02/24/2020 142     Assessment: Pharmacy has been consulted for electrolyte management. Patient continues to have poor appetite and oral intake in hospital. Pt is at high refeed risk secondary to alcohol abuse and poor oral intake. Megace started yesterday.   K 3.9 > 4.1  Scr 0.61 > 0.63  Phos 3.7 > 3.8  Mg 1.8 > 2.0  Medications: Losartan, KCL 40 mEq daily  Goal of Therapy:  Electrolytes WNL.   Plan:  No electrolyte replacement needed at this time. Will order electrolytes with AM labs.   Katha Cabal ,PharmD Clinical Pharmacist 02/24/2020 8:37 AM

## 2020-02-25 LAB — MAGNESIUM: Magnesium: 1.9 mg/dL (ref 1.7–2.4)

## 2020-02-25 LAB — BASIC METABOLIC PANEL
Anion gap: 4 — ABNORMAL LOW (ref 5–15)
BUN: 14 mg/dL (ref 8–23)
CO2: 25 mmol/L (ref 22–32)
Calcium: 8.8 mg/dL — ABNORMAL LOW (ref 8.9–10.3)
Chloride: 110 mmol/L (ref 98–111)
Creatinine, Ser: 0.6 mg/dL (ref 0.44–1.00)
GFR calc Af Amer: >60 mL/min (ref >60)
GFR calc non Af Amer: >60 mL/min (ref >60)
Glucose, Bld: 113 mg/dL — ABNORMAL HIGH (ref 70–99)
Potassium: 4 mmol/L (ref 3.5–5.1)
Sodium: 139 mmol/L (ref 135–145)

## 2020-02-25 LAB — PHOSPHORUS: Phosphorus: 3.7 mg/dL (ref 2.5–4.6)

## 2020-02-25 LAB — GLUCOSE, CAPILLARY: Glucose-Capillary: 91 mg/dL (ref 70–99)

## 2020-02-25 MED ORDER — OLMESARTAN MEDOXOMIL 40 MG PO TABS: 40.0000 mg | ORAL_TABLET | Freq: Every day | ORAL | 1 refills | Status: DC

## 2020-02-25 MED ORDER — ENSURE ENLIVE PO LIQD: 237.0000 mL | Freq: Two times a day (BID) | ORAL | 12 refills | Status: DC

## 2020-02-25 MED ORDER — MEGESTROL ACETATE 400 MG/10ML PO SUSP: 300.0000 mg | Freq: Two times a day (BID) | ORAL | 1 refills | Status: DC

## 2020-02-25 MED ORDER — THIAMINE HCL 100 MG PO TABS: 50.0000 mg | ORAL_TABLET | Freq: Every day | ORAL | 0 refills | Status: DC

## 2020-02-25 MED ORDER — FOLIC ACID 1 MG PO TABS: 1.0000 mg | ORAL_TABLET | Freq: Every day | ORAL | 0 refills | Status: DC

## 2020-02-25 MED ORDER — LACTULOSE 10 GM/15ML PO SOLN: 20.0000 g | Freq: Every day | ORAL | 0 refills | Status: DC

## 2020-02-25 MED ORDER — ADULT MULTIVITAMIN W/MINERALS CH: 1.0000 | ORAL_TABLET | Freq: Every day | ORAL | 0 refills | Status: DC

## 2020-02-25 NOTE — Discharge Summary (Signed)
[place] [facility] [place]: Philicia Heyne    MR#:  [id number]  DATE OF BIRTH:  1951/04/09  DATE OF ADMISSION:  02/18/2020 ADMITTING PHYSICIAN: Samuella Cota, MD  DATE OF DISCHARGE: 02/25/2020  PRIMARY CARE PHYSICIAN: [facility]    ADMISSION DIAGNOSIS:  Confusion [R41.0] Encephalopathy [D74.12] Acute metabolic encephalopathy [I78.67]  DISCHARGE DIAGNOSIS:  Acute metabolic encephalopathy/Alcoholism HTN Electrolyte abnormality SECONDARY DIAGNOSIS:   Past Medical History:  Diagnosis Date  . Diabetes mellitus without complication ([place])   . Hypertension     HOSPITAL COURSE:  69 year old woman PMH of alcohol abuse, cirrhosis, found in parking lot confused. ED. 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 Serum alcohol was positive although patient denied intake.  Acute on chronic metabolic encephalopathy, daily alcohol abuse, reported longstanding hallucinations, confusion concerning for Wernicke-Korsakoff.  -mentation much better -CT head no acute abnormalities.MRI brain unrevealing. EEG was negative.  -TSH, B12, ESR, CRP within normal limits.  --recieved high-dose IV thiamine for 3 days--change to po 250 mg thiamine for 5 days per neurology rec --ammonia down to 25 --cont daily lactulose --Psychiatry involved--does not feel pt need inpt behavioral med transfer. --pt has been relatively calm and not required ativan > 24 hours -pt not requiring ativan -cont prn safety rounds  Alcoholic cirrhosis without ascites, with hyperammonemia, thrombocytopenia.  -Ammonia level only modestly elevated at 48on admission--25 --Has been referred in the past to the liver clinic but has not followed up.  Anemia of chronic disease, stable  Diabetes mellitus type 2 diet controlled -sugars stable -not on any  meds  Essential hypertension -Continue hydrochlorothiazide and ARB  ADD. On methylphenidate as an outpatient, daughter concerned that patient might be over taking this medication --Methylphenidate discontinued permanently.  Adult failure to thrive, poor po nutrition, at risk for re-feeding syndrome -d/w dietitian start po megace and cont nutritional supplements -staff to encourage pt to eat -Seen by palliative care--pt is DNR - long term poor prognosis  Procedures:none Family communication : dter [name] on the phone today Consults :Psych, neurology CODE STATUS:  DNR DVT Prophylaxis :SCD--low plts Discharge Disposition: Per TOC no offers from any SNF's  [place] and out of county D/c [facility] [place]   CONSULTS OBTAINED:  Treatment Team:  Dixie Dials, MD  DRUG ALLERGIES:  No Known Allergies  DISCHARGE MEDICATIONS:   Allergies as of 02/25/2020   No Known Allergies     Medication List    STOP taking these medications   methylphenidate 36 MG CR tablet Commonly known as: CONCERTA     TAKE these medications   feeding supplement (ENSURE ENLIVE) Liqd Take 237 mLs by mouth 2 (two) times daily between meals.   folic acid 1 MG tablet Commonly known as: FOLVITE Take 1 tablet (1 mg total) by mouth daily. Start taking on: February 26, 2020   hydrochlorothiazide 12.5 MG tablet Commonly known as: HYDRODIURIL Take 12.5 mg by mouth daily.   lactulose 10 GM/15ML solution Commonly known as: CHRONULAC Take 30 mLs (20 g total) by mouth daily. Start taking on: February 26, 2020   megestrol 400 MG/10ML suspension Commonly known as: MEGACE Take 7.5 mLs (300 mg total) by mouth 2 (two) times daily.   multivitamin with minerals Tabs tablet Take 1 tablet by mouth daily. Start taking on: February 26, 2020   olmesartan 40 MG tablet Commonly known as: BENICAR Take 1 tablet (40 mg total) by mouth daily.  potassium chloride 10 MEQ tablet Commonly known as: KLOR-CON Take 10  mEq by mouth daily.   thiamine 100 MG tablet Take 0.5 tablets (50 mg total) by mouth daily. Start taking on: February 26, 2020       If you experience worsening of your admission symptoms, develop shortness of breath, life threatening emergency, suicidal or homicidal thoughts you must seek medical attention immediately by calling 911 or calling your MD immediately  if symptoms less severe.  You Must read complete instructions/literature along with all the possible adverse reactions/side effects for all the Medicines you take and that have been prescribed to you. Take any new Medicines after you have completely understood and accept all the possible adverse reactions/side effects.   Please note  You were cared for by a hospitalist during your hospital stay. If you have any questions about your discharge medications or the care you received while you were in the hospital after you are discharged, you can call the unit and asked to speak with the hospitalist on call if the hospitalist that took care of you is not available. Once you are discharged, your primary care physician will handle any further medical issues. Please note that NO REFILLS for any discharge medications will be authorized once you are discharged, as it is imperative that you return to your primary care physician (or establish a relationship with a primary care physician if you do not have one) for your aftercare needs so that they can reassess your need for medications and monitor your lab values.   DATA REVIEW:   CBC  Recent Labs  Lab 02/19/20 0459  WBC 4.5  HGB 8.9*  HCT 27.7*  PLT 46*    Chemistries  Recent Labs  Lab 02/22/20 0501 02/24/20 0650 02/25/20 0359  NA 144   < > 139  K 3.9   < > 4.0  CL 109   < > 110  CO2 28   < > 25  GLUCOSE 92   < > 113*  BUN 6*   < > 14  CREATININE 0.61   < > 0.60  CALCIUM 8.9   < > 8.8*  MG 1.8   < > 1.9  AST 60*  --   --   ALT 28  --   --   ALKPHOS 96  --   --    BILITOT 5.5*  --   --    < > = values in this interval not displayed.    Microbiology Results   Recent Results (from the past 240 hour(s))  Respiratory Panel by RT PCR (Flu A&B, Covid) - Nasopharyngeal Swab     Status: None   Collection Time: 02/18/20 10:01 AM   Specimen: Nasopharyngeal Swab  Result Value Ref [place] Status   SARS Coronavirus 2 by RT PCR NEGATIVE NEGATIVE Final    Comment: (NOTE) SARS-CoV-2 target nucleic acids are NOT DETECTED. The SARS-CoV-2 RNA is generally detectable in upper respiratoy specimens during the acute phase of infection. The lowest concentration of SARS-CoV-2 viral copies this assay can detect is 131 copies/mL. A negative result does not preclude SARS-Cov-2 infection and should not be used as the sole basis for treatment or other patient management decisions. A negative result may occur with  improper specimen collection/handling, submission of specimen other than nasopharyngeal swab, presence of viral mutation(s) within the areas targeted by this assay, and inadequate number of viral copies (<131 copies/mL). A negative result must be combined with clinical observations, patient history,  and epidemiological information. The expected result is Negative. Fact Sheet for Patients:  PinkCheek.be Fact Sheet for Healthcare Providers:  GravelBags.it This test is not yet ap proved or cleared by the [place] FDA and  has been authorized for detection and/or diagnosis of SARS-CoV-2 by FDA under an Emergency Use Authorization (EUA). This EUA will remain  in effect (meaning this test can be used) for the duration of the COVID-19 declaration under Section 564(b)(1) of the Act, 21 U.S.C. section 360bbb-3(b)(1), unless the authorization is terminated or revoked sooner.    Influenza A by PCR NEGATIVE NEGATIVE Final   Influenza B by PCR NEGATIVE NEGATIVE Final    Comment: (NOTE) The Xpert Xpress  SARS-CoV-2/FLU/RSV assay is intended as an aid in  the diagnosis of influenza from Nasopharyngeal swab specimens and  should not be used as a sole basis for treatment. Nasal washings and  aspirates are unacceptable for Xpert Xpress SARS-CoV-2/FLU/RSV  testing. Fact Sheet for Patients: PinkCheek.be Fact Sheet for Healthcare Providers: GravelBags.it This test is not yet approved or cleared by the [place] FDA and  has been authorized for detection and/or diagnosis of SARS-CoV-2 by  FDA under an Emergency Use Authorization (EUA). This EUA will remain  in effect (meaning this test can be used) for the duration of the  Covid-19 declaration under Section 564(b)(1) of the Act, 21  U.S.C. section 360bbb-3(b)(1), unless the authorization is  terminated or revoked. Performed at [facility], [address], [place], [place] [address]     RADIOLOGY:  No results found.   CODE STATUS:     Code Status Orders  (From admission, onward)         Start     Ordered   02/23/20 1339  Do not attempt resuscitation (DNR)  Continuous    Question Answer Comment  In the event of cardiac or respiratory ARREST Do not call a "code blue"   In the event of cardiac or respiratory ARREST Do not perform Intubation, CPR, defibrillation or ACLS   In the event of cardiac or respiratory ARREST Use medication by any route, position, wound care, and other measures to relive pain and suffering. May use oxygen, suction and manual treatment of airway obstruction as needed for comfort.      02/23/20 1338        Code Status History    Date Active Date Inactive Code Status Order ID Comments User Context   02/18/2020 0924 02/23/2020 1338 Full Code [id number]  Louellen Molder, MD ED   02/18/2020 0909 02/18/2020 0923 Full Code [id number]  Duffy Bruce, MD ED   Advance Care Planning Activity       TOTAL TIME TAKING CARE OF THIS PATIENT: *40*  minutes.    Fritzi Mandes M.D  [facility]    CC: Primary care physician; [facility], [facility]

## 2020-02-25 NOTE — TOC Transition Note (Signed)
Transition of Care [facility]) - CM/SW Discharge Note   Patient Details  Name: Sheila Bowman MRN: [id number] Date of Birth: 22-Mar-1951  [facility]) CM/SW Contact:  Maree Krabbe, LCSW Phone Number: 02/25/2020, 9:22 AM   Clinical Narrative:   Pt will d/c home. Home health has been set up. Equipment will be delivered to the room.     Final next level of care: Home w Home Health Services Barriers to Discharge: No Barriers Identified, Barriers Resolved   Patient Goals and CMS Choice Patient states their goals for this hospitalization and ongoing recovery are:: for pt to go to SNF- per pt's daughter   Choice offered to / list presented to : Adult Children  Discharge Placement                  Name of family member notified: kelly Patient and family notified of of transfer: 02/25/20  Discharge Plan and Services In-house Referral: Clinical Social Work(SA counseling, however pt is not alert and oriented) Discharge Planning Services: CM Consult Post Acute Care Choice: Skilled Nursing Facility          DME Arranged: 3-N-1, Dan Humphreys DME Agency: [employer] Date DME Agency Contacted: 02/25/20 Time DME Agency Contacted: [phone] Representative spoke with at DME Agency: brad HH Arranged: PT, OT, Nurse's Aide, RN, Social Work [employer] Agency: [facility] Date HH Agency Contacted: 02/25/20 Time HH Agency Contacted: [phone] Representative spoke with at [facility] Agency: brittany  Social Determinants of Health (SDOH) Interventions     Readmission Risk Interventions No flowsheet data found.

## 2020-02-25 NOTE — Progress Notes (Signed)
New referral for [employer] community Palliative program to follow at home received from Palliative NP Harvest Dark. TOC Bridget Cobb made aware. Patient information given to referral. Plan is for discharge today. Thank you. Dayna Barker BSN, RN, [facility] [employer] [phone]

## 2020-02-25 NOTE — Progress Notes (Addendum)
Patient had an unwitnessed "fall" out of the room chair onto her buttocks. No pain. No injury. States was attempting to "get a newspaper". Fall mats already down. Already informed Dr. Allena Katz in person. No new orders. NT has already taken a set of vital signs. Patient already discharged. Will inform family / fill out flow sheets. Charge nurse aware. Will continue to monitor. Raynald Blend   Informed unit A.D. of above through secure chat. Jari Favre Edu On (10:46A)

## 2020-02-25 NOTE — Progress Notes (Signed)
Daily Progress Note   Patient Name: Linzee Burrows       Date: 02/25/2020 DOB: [birth year]  Age: 69 y.o. MRN#: [id number] Attending Physician: Enedina Finner, MD Primary Care Physician: [facility] Admit Date: 02/18/2020  Reason for Consultation/Follow-up: Establishing goals of care  Subjective: Awake and interactive this morning, very verbal but confused. Tells me she ate all of her breakfast.   Length of Stay: 6  Current Medications: Scheduled Meds:  . feeding supplement (ENSURE ENLIVE)  237 mL Oral BID BM  . folic acid  1 mg Oral Daily  . hydrochlorothiazide  12.5 mg Oral Daily  . irbesartan  300 mg Oral Daily  . lactulose  20 g Oral Daily  . megestrol  300 mg Oral BID  . multivitamin with minerals  1 tablet Oral Daily  . potassium chloride  40 mEq Oral Daily  . sodium chloride flush  3 mL Intravenous Q12H  . thiamine  250 mg Oral Daily    Continuous Infusions:   PRN Meds: acetaminophen **OR** acetaminophen, ondansetron **OR** ondansetron (ZOFRAN) IV  Physical Exam Constitutional:      General: She is not in acute distress. HENT:     Head: Normocephalic and atraumatic.  Pulmonary:     Effort: Pulmonary effort is normal.  Musculoskeletal:     Right lower leg: No edema.     Left lower leg: No edema.  Skin:    General: Skin is warm and dry.  Neurological:     Mental Status: She is alert. She is disoriented.             Vital Signs: BP 118/66 (BP Location: Left Arm)   Pulse 91   Temp (!) 97.5 F (36.4 C) (Oral)   Resp 16   Ht 5\' 6"  (1.676 m)   Wt 53.9 kg   SpO2 97%   BMI 19.19 kg/m  SpO2: SpO2: 97 % O2 Device: O2 Device: Room Air O2 Flow Rate:    Intake/output summary:   Intake/Output Summary (Last 24 hours) at 02/25/2020 1411 Last data filed at  02/25/2020 0930 Gross per 24 hour  Intake 243 ml  Output 0 ml  Net 243 ml   LBM: Last BM Date: (patient unable to report.) Baseline Weight: Weight: 56.7 kg Most recent weight: Weight: 53.9 kg       Palliative Assessment/Data: PPS 50%    Flowsheet Rows     Most Recent Value  Intake Tab  Referral Department  Hospitalist  Unit at Time of Referral  Cardiac/Telemetry Unit  Palliative Care Primary Diagnosis  Neurology  Date Notified  02/23/20  Palliative Care Type  New Palliative care  Reason for referral  Clarify Goals of Care  Date of Admission  02/18/20  Date first seen by Palliative Care  02/23/20  # of days Palliative referral response time  0 Day(s)  # of days IP prior to Palliative referral  5  Clinical Assessment  Palliative Performance Scale Score  20%  Psychosocial & Spiritual Assessment  Palliative Care Outcomes  Patient/Family meeting held?  Yes  Who was at the meeting?  daughter  Palliative Care Outcomes  Clarified goals of care, Counseled regarding hospice, Provided psychosocial  or spiritual support, Changed CPR status, Completed durable DNR, Linked to palliative care logitudinal support, Provided advance care planning      Patient Active Problem List   Diagnosis Date Noted  . Goals of care, counseling/discussion   . DNR (do not resuscitate)   . Palliative care by specialist   . Failure to thrive in adult   . Acute metabolic encephalopathy 12/23/8249  . Alcoholic cirrhosis of liver without ascites ([place]) 02/18/2020  . Alcohol abuse with alcohol-induced disorder ([place]) 02/18/2020  . Hypokalemia 02/18/2020  . Type 2 diabetes mellitus without complication ([place]) 03/70/4888  . Essential hypertension 02/18/2020    Palliative Care Assessment & Plan   HPI: 69 y.o. female  with past medical history of ongoing ETOH use, cirrhosis, diastolic CHF, HTN, B1QX, and ADHD admitted on 02/18/2020 with AMS - found confused in [place] parking lot. Per family, increasing confusion  and hallucinations for past 8-9 months. Multiple falls at home.  She has been referred to hepatology for cirrhosis but has not follow up. During hospitalization she has been treated with ativan following CIWA protocol. Patient has symptoms concerning for Wernicke- Korsakoff syndrome. CT, MRI, and EEG all negative. Patient also with poor nutrition - megace initiated. PMT consulted for [place].  Assessment: Spoke with daughter [name] - provided update. Discussed that patient is coming home with support of home health. All questions answered. Discussed outpatient palliative support - [name] is very interested in this. Discussed with TOC team and [place] liaison. To discharge later today.   Recommendations/Plan:  Referral for outpatient palliative  Code Status:  DNR  Prognosis:  Unable to determine  Discharge Planning:  Home with Home Health and palliative   Care plan was discussed with patient's daughter [name], Dr. Posey Pronto, [place]  Thank you for allowing the Palliative Medicine Team to assist in the care of this patient.   Total Time 25 minutes Prolonged Time Billed  no       Greater than 50%  of this time was spent counseling and coordinating care related to the above assessment and plan.  Juel Burrow, DNP, [facility] Palliative Medicine Team Team Phone # [phone]  Pager [phone]

## 2020-02-25 NOTE — Care Management Important Message (Signed)
Important Message  Patient Details  Name: Sheila Bowman MRN: [id number] Date of Birth: 1951/03/26   Medicare Important Message Given:  Yes     [name] 02/25/2020, 12:58 PM

## 2020-02-25 NOTE — Progress Notes (Signed)
[facility] [place] [facility] [place]: Sheila Bowman    MR#:  [id number]  DATE OF BIRTH:  [birth year]  SUBJECTIVE:  More awake and alert. Very conversive today  REVIEW OF SYSTEMS:   Review of Systems  Unable to perform ROS: Mental acuity   Tolerating Diet:yes Tolerating PT: rehab  DRUG ALLERGIES:  No Known Allergies  VITALS:  Blood pressure 125/60, pulse 71, temperature 98 F (36.7 C), temperature source Oral, resp. rate 18, height '5\' 6"'$  (1.676 m), weight 53.9 kg, SpO2 98 %.  PHYSICAL EXAMINATION:   Physical Exam  GENERAL:  69 y.o.-year-old patient lying in the bed with no acute distress. Disheveled EYES: Pupils equal, round, reactive to light and accommodation. No scleral icterus.   HEENT: Head atraumatic, normocephalic. Oropharynx and nasopharynx clear.  NECK:  Supple, no jugular venous distention. No thyroid enlargement, no tenderness.  LUNGS: Normal breath sounds bilaterally, no wheezing, rales, rhonchi. No use of accessory muscles of respiration.  CARDIOVASCULAR: S1, S2 normal. No murmurs, rubs, or gallops.  ABDOMEN: Soft, nontender, nondistended. Bowel sounds present. No organomegaly or mass.  EXTREMITIES: No cyanosis, clubbing or edema b/l.    NEUROLOGIC: grossly nonfocal. Moves all extremities well PSYCHIATRIC:  patient is awake and alert SKIN: No obvious rash, lesion, or ulcer--per RN  LABORATORY PANEL:  CBC Recent Labs  Lab 02/19/20 0459  WBC 4.5  HGB 8.9*  HCT 27.7*  PLT 46*    Chemistries  Recent Labs  Lab 02/22/20 0501 02/24/20 0650 02/25/20 0359  NA 144   < > 139  K 3.9   < > 4.0  CL 109   < > 110  CO2 28   < > 25  GLUCOSE 92   < > 113*  BUN 6*   < > 14  CREATININE 0.61   < > 0.60  CALCIUM 8.9   < > 8.8*  MG 1.8   < > 1.9  AST 60*  --   --   ALT 28  --   --   ALKPHOS 96  --   --   BILITOT 5.5*  --   --    < > = values in this interval not displayed.   Cardiac Enzymes No results for input(s):  TROPONINI in the last 168 hours. RADIOLOGY:  No results found. ASSESSMENT AND PLAN:  69 year old woman PMH of alcohol abuse, cirrhosis, found in parking lot confused.  ED.  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  Serum alcohol was positive although patient denied intake.  Acute on chronic metabolic encephalopathy, daily alcohol abuse, reported longstanding hallucinations, confusion concerning for  Wernicke-Korsakoff.   -mentation much better -CT head no acute abnormalities.  MRI brain unrevealing.  EEG was negative.   -TSH, B12, ESR, CRP within normal limits.   --recieved high-dose IV thiamine for 3 days --change to po 250 mg thiamine for 5 days per neurology rec --ammonia down to 25 --cont daily lactulose --Psychiatry involved--does not feel pt need inpt behavioral med transfer. --pt has been relatively calm and not required ativan > 24 hours -pt not requiring ativan -cont prn safety rounds  Alcohol abuse--chronic --no signs of acute withdrawal  At present --Magnesium and potassium repleted  Alcoholic cirrhosis without ascites, with hyperammonemia, thrombocytopenia.   --Possible alcoholic hepatitis.  Ammonia level only modestly elevated at 48 on admission--25 --Has been referred in the past to the liver clinic but has not followed up.  Anemia of chronic disease, stable  Diabetes mellitus type 2 diet controlled -sugars stable  Essential hypertension - Continue hydrochlorothiazide and ARB  ADD. On methylphenidate as an outpatient, daughter concerned that patient might be over taking this medication --Methylphenidate discontinued permanently.  Adult failure to thrive, poor po nutrition, at risk for re-feeding syndrome -d/w dietitian start po megace and cont nutritional supplements -staff to encourage pt to eat -Seen by palliative care--pt is DNR - long term poor  prognosis  Procedures:none Family communication : dter [name] on the phone today Consults :Psych, neurology Discharge Disposition : SNF CODE STATUS:  DNR DVT Prophylaxis :SCD--low plts Discharge Disposition: Per TOC one facility in [facility] has offered--will await final decision on it today. Back up plan Home with HH. dter [name] is aware of it.  TOTAL TIME TAKING CARE OF THIS PATIENT: *25* minutes.  >50% time spent on counselling and coordination of care  Note: This dictation was prepared with Dragon dictation along with smaller phrase technology. Any transcriptional errors that result from this process are unintentional.  Fritzi Mandes M.D    [facility]   CC: Primary care physician; [facility] MedicalPatient ID: Sheila Bowman, female   DOB: December 19, 1951, 69 y.o.   MRN: [id number]

## 2020-02-25 NOTE — TOC Progression Note (Addendum)
Transition of Care [facility]) - Progression Note    Patient Details  Name: Randy Whitener MRN: [id number] Date of Birth: Dec 21, 1951  [facility] CM/SW Contact  Maree Krabbe, LCSW Phone Number: 02/25/2020, 8:53 AM  Clinical Narrative:   In the [place] area the only facility to accept pt was [facility]. After accepting the pt the admissions director had to send it to corporate for review. It was determined this morning that [facility] will not accept pt due to hx of alcoholism. No additional bed offers at this time.  CSW spoke with pt's daughter via telephone. CSW explained pt does not have a bed offer at this time and pt is stable for d/c. Pt's daughter is understanding and appreciative for the work the staff has put in thus far. Pt's daughter is accepting that pt will d/c home today to pt's daughters house. Pt is appreciative for home health and would like to use [facility]. Pt's daughter is also appreciative for any equipment recommended by PT. CSW will reach out to [facility] and [facility] to arrange home health and equipment. Pt will d/c home today.     Expected Discharge Plan: Skilled Nursing Facility Barriers to Discharge: Continued Medical Work up, No SNF bed  Expected Discharge Plan and Services Expected Discharge Plan: Skilled Nursing Facility In-house Referral: Clinical Social Work(SA counseling, however pt is not alert and oriented) Discharge Planning Services: CM Consult Post Acute Care Choice: Skilled Nursing Facility Living arrangements for the past 2 months: Single Family Home                                       Social Determinants of Health (SDOH) Interventions    Readmission Risk Interventions No flowsheet data found.

## 2020-02-25 NOTE — Discharge Instructions (Signed)
Confusion °Confusion is the inability to think with the usual speed or clarity. People who are confused often describe their thinking as cloudy or unclear. Confusion can also include feeling disoriented. This means you are unaware of where you are or who you are. You may also not know the date or time. When confused, you may have difficulty remembering, paying attention, or making decisions. Some people also act aggressively when they are confused. °In some cases, confusion may come on quickly. In other cases, it may develop slowly over time. How quickly confusion comes on depends on the cause. °Confusion may be caused by: °· Head injury (concussion). °· Seizures. °· Stroke. °· Fever. °· Brain tumor. °· Decrease in brain function due to a vascular or neurologic condition (dementia). °· Emotions, like rage or terror. °· Inability to know what is real and what is not (hallucinations). °· Infections, such as a urinary tract infection (UTI). °· Using too much alcohol, drugs, or medicines. °· Loss of fluid (dehydration) or an imbalance of salts in the body (electrolytes). °· Lack of sleep. °· Low blood sugar (diabetes). °· Low levels of oxygen. This comes from conditions such as chronic lung disorders. °· Side effects of medicines, or taking medicines that affect other medicines (drug interactions). °· Lack of certain nutrients, especially niacin, thiamine, vitamin C, or vitamin B. °· Sudden drop in body temperature (hypothermia). °· Change in routine, such as traveling or being hospitalized. °Follow these instructions at home: °Pay attention to your symptoms. Tell your health care provider about any changes or if you develop new symptoms. Follow these instructions to control or treat symptoms. Ask a family member or friend for help if needed. °Medicines °· Take over-the-counter and prescription medicines only as told by your health care provider. °· Ask your health care provider about changing or stopping any medicines  that may be causing your confusion. °· Avoid pain medicines or sleep medicines until you have fully recovered. °· Use a pillbox or an alarm to help you take the right medicines at the right time. °Lifestyle ° °· Eat a balanced diet that includes fruits and vegetables. °· Get enough sleep. For most adults, this is 7-9 hours each night. °· Do not drink alcohol. °· Do not become isolated. Spend time with other people and make plans for your days. °· Do not drive until your health care provider says that it is safe to do so. °· Do not use any products that contain nicotine or tobacco, such as cigarettes and e-cigarettes. If you need help quitting, ask your health care provider. °· Stop other activities that may increase your chances of getting hurt. These may include some work duties, sports activities, swimming, or bike riding. Ask your health care provider what activities are safe for you. °What caregivers can do °· Find out if the person is confused. Ask the person to state his or her name, age, and the date. If the person is unsure or answers incorrectly, he or she may be confused. °· Always introduce yourself, no matter how well the person knows you. °· Remind the person of his or her location. Do this often. °· Place a calendar and clock near the person who is confused. °· Talk about current events and plans for the day. °· Keep the environment calm, quiet, and peaceful. °· Help the person do the things that he or she is unable to do. These include: °? Taking medicines. °? Keeping follow-up visits with his or her health care   provider. °? Helping with household duties, including meal preparation. °? Running errands. °· Get help if you need it. There are several support groups for caregivers. °· If the person you are helping needs more support, consider day care, extended care programs, or a skilled nursing facility. The person's health care provider may be able to help evaluate these options. °General  instructions °· Monitor yourself for any conditions you may have. These may include: °? Checking your blood glucose levels, if you have diabetes. °? Watching your weight, if you are overweight. °? Monitoring your blood pressure, if you have hypertension. °? Monitoring your body temperature, if you have a fever. °· Keep all follow-up visits as told by your health care provider. This is important. °Contact a health care provider if: °· Your symptoms get worse. °Get help right away if you: °· Feel that you are not able to care for yourself. °· Develop severe headaches, repeated vomiting, seizures, blackouts, or slurred speech. °· Have increasing confusion, weakness, numbness, restlessness, or personality changes. °· Develop a loss of balance, have marked dizziness, feel uncoordinated, or fall. °· Develop severe anxiety, or you have delusions or hallucinations. °These symptoms may represent a serious problem that is an emergency. Do not wait to see if the symptoms will go away. Get medical help right away. Call your local emergency services (911 in the [place] Do not drive yourself to the hospital. °Summary °· Confusion is the inability to think with the usual speed or clarity. People who are confused often describe their thinking as cloudy or unclear. °· Confusion can also include having difficulty remembering, paying attention, or making decisions. °· Confusion may come on quickly or develop slowly over time, depending on the cause. There are many different causes of confusion. °· Ask for help from family members or friends if you are unable to take care of yourself. °This information is not intended to replace advice given to you by your health care provider. Make sure you discuss any questions you have with your health care provider. °Document Revised: 12/19/2017 Document Reviewed: 12/19/2017 °Elsevier Patient Education © 2020 [employer] ° °

## 2020-02-25 NOTE — Consult Note (Signed)
PHARMACY CONSULT NOTE - FOLLOW UP  Pharmacy Consult for Electrolyte Monitoring and Replacement   Recent Labs: Potassium (mmol/L)  Date Value  02/25/2020 4.0   Magnesium (mg/dL)  Date Value  07/21/8287 1.9   Calcium (mg/dL)  Date Value  33/74/4514 8.8 (L)   Albumin (g/dL)  Date Value  60/47/9987 2.8 (L)   Phosphorus (mg/dL)  Date Value  21/58/7276 3.7   Sodium (mmol/L)  Date Value  02/25/2020 139     Assessment: Pharmacy has been consulted for electrolyte management. Patient continues to have poor appetite and oral intake in hospital. Pt is at high refeed risk secondary to alcohol abuse and poor oral intake. Megace started yesterday.   K 3.9 > 4.1> 4.0 Scr 0.61 > 0.63> 0.60 Phos 3.7 > 3.8 > 3.7 Mg 1.8 > 2.0 > 1.9  Medications: Losartan, KCL 40 mEq daily  Goal of Therapy:  Electrolytes WNL.   Plan:  No electrolyte replacement needed at this time. Will order electrolytes with AM labs.   Katha Cabal ,PharmD Clinical Pharmacist 02/25/2020 7:29 AM

## 2020-02-26 ENCOUNTER — Telehealth: Payer: Self-pay | Admitting: Adult Health Nurse Practitioner

## 2020-02-26 NOTE — Telephone Encounter (Signed)
Spoke with patient's daughter [name] regarding Palliative services and all questions were answered and she was in agreement with this.  I have scheduled an In-Person Consult for 03/04/20 @ 12 Noon.

## 2020-03-04 ENCOUNTER — Other Ambulatory Visit: Payer: Self-pay

## 2020-03-04 ENCOUNTER — Other Ambulatory Visit: Payer: Medicare Other | Admitting: Adult Health Nurse Practitioner

## 2020-03-04 DIAGNOSIS — K7030 Alcoholic cirrhosis of liver without ascites: Secondary | ICD-10-CM

## 2020-03-04 DIAGNOSIS — Z515 Encounter for palliative care: Secondary | ICD-10-CM

## 2020-03-04 NOTE — Progress Notes (Signed)
[facility] Consult Note Telephone: [phone]  Fax: [phone]  PATIENT NAME: Sheila Bowman DOB: May 27, 1951 MRN: [id number]  PRIMARY CARE PROVIDER:   [facility]  REFERRING PROVIDER:  Dr. Benjamine Mola Aderoju  RESPONSIBLE PARTY:   Lestine Box, daughter [phone]    RECOMMENDATIONS and PLAN:  1.  Advanced care planning.  Patient is a DNR. Started going over MOST form and left copy for family to go over and will further discuss at future visit.  2.  Alcoholic cirrhosis of the liver.  Patient was hospitalized 1/27-5/17/0017 for metabolic encephalopathy after being found confused in a parking lot.  She denied alcohol intake but tested positive for alcohol intake.  She had confusion, memory loss, gait difficulties and visual changes. She was treated with IV thiamine, started on lactulose, and was seen by psychiatry.  She was not eating and was started on megace which the daughter states has helped and she is eating well now.  Patient has no concerns today and states that she is fine.  Daughter feels like she has improved since coming home.  She was evaluated by [facility] home health nurse yesterday and will start OT.  Patient's mobility has improved tremendously since being home that nurse did not feel like PT was needed.  Patient is able to ambulate without assistive devices.  Daughter states that she has not showered since being home.  Patient states that she just hasn't wanted to.  May have some depression but patient denies depression.  Patient will also be followed by home health SW.  Did discuss today at patient's direction possible placement in ALF.  We discussed this would be a good idea and that the SW would be a good resource to help with this transition.    Daughter states that she was not able to be placed in SNF upon hospital discharge due to combativeness.  Patient has not displayed any combativeness today.  She  has not had any alcohol since prior to hospitalization. Her combativeness more than likely related to alcohol withdrawal in the hospital.  Patient is noted having forgetfulness and she quickly gets off topic.  Daughter states that she has noticed short term and long term memory loss.    Daughter wants to look into a PCP that is closer as she nor her mother drive and they have to rely on others to help them or using public transportation.  Have encouraged to talk about this together and to look into a PCP that is more accessible.  Patient doing much better since coming home from hospital.  Palliative will continue to monitor for symptom management /decline and make recommendations as needed.  Will call in 4 weeks to schedule follow up visit  I spent 90 minutes providing this consultation,  from 12:00 to 1:30. More than 50% of the time in this consultation was spent coordinating communication.   HISTORY OF PRESENT ILLNESS:  Sheila Bowman is a 69 y.o. year old female with multiple medical problems including Cirrhosis of the liver without ascites, HTN, DMT2, CHF, alcoholism, wernicke's encephalopathy. Palliative Care was asked to help address goals of care.   CODE STATUS: DNR  PPS: 60% HOSPICE ELIGIBILITY/DIAGNOSIS: TBD  PHYSICAL EXAM:   General: NAD, frail appearing, thin Extremities: no edema, no joint deformities Skin: no rashes Neurological: Weakness;  Patient has forgetfulness   PAST MEDICAL HISTORY:  Past Medical History:  Diagnosis Date  . Diabetes mellitus without complication ([place])   .  Hypertension     SOCIAL HX:  Social History   Tobacco Use  . Smoking status: Former Games developer  . Smokeless tobacco: Never Used  Substance Use Topics  . Alcohol use: Yes    Comment: daily    ALLERGIES: No Known Allergies   PERTINENT MEDICATIONS:  Outpatient Encounter Medications as of 03/04/2020  Medication Sig  . feeding supplement, ENSURE ENLIVE, (ENSURE ENLIVE) LIQD Take 237 mLs by mouth  2 (two) times daily between meals.  . folic acid (FOLVITE) 1 MG tablet Take 1 tablet (1 mg total) by mouth daily.  . hydrochlorothiazide (HYDRODIURIL) 12.5 MG tablet Take 12.5 mg by mouth daily.  Marland Kitchen lactulose (CHRONULAC) 10 GM/15ML solution Take 30 mLs (20 g total) by mouth daily.  . megestrol (MEGACE) 400 MG/10ML suspension Take 7.5 mLs (300 mg total) by mouth 2 (two) times daily.  . Multiple Vitamin (MULTIVITAMIN WITH MINERALS) TABS tablet Take 1 tablet by mouth daily.  Marland Kitchen olmesartan (BENICAR) 40 MG tablet Take 1 tablet (40 mg total) by mouth daily.  . potassium chloride (KLOR-CON) 10 MEQ tablet Take 10 mEq by mouth daily.  Marland Kitchen thiamine 100 MG tablet Take 0.5 tablets (50 mg total) by mouth daily.   No facility-administered encounter medications on file as of 03/04/2020.     Rose Hippler Marlena Clipper, NP

## 2020-03-10 ENCOUNTER — Emergency Department
Admission: EM | Admit: 2020-03-10 | Discharge: 2020-03-11 | Disposition: A | Discharge status: Home / Self Care | Payer: Medicare Other | Attending: Emergency Medicine | Admitting: Emergency Medicine

## 2020-03-10 ENCOUNTER — Other Ambulatory Visit: Payer: Self-pay

## 2020-03-10 ENCOUNTER — Encounter: Payer: Self-pay | Admitting: Emergency Medicine

## 2020-03-10 DIAGNOSIS — I10 Essential (primary) hypertension: Secondary | ICD-10-CM | POA: Diagnosis not present

## 2020-03-10 DIAGNOSIS — Z79899 Other long term (current) drug therapy: Secondary | ICD-10-CM | POA: Diagnosis not present

## 2020-03-10 DIAGNOSIS — E119 Type 2 diabetes mellitus without complications: Secondary | ICD-10-CM | POA: Diagnosis not present

## 2020-03-10 DIAGNOSIS — F22 Delusional disorders: Secondary | ICD-10-CM | POA: Diagnosis present

## 2020-03-10 DIAGNOSIS — F0390 Unspecified dementia without behavioral disturbance: Secondary | ICD-10-CM | POA: Insufficient documentation

## 2020-03-10 DIAGNOSIS — R410 Disorientation, unspecified: Secondary | ICD-10-CM

## 2020-03-10 DIAGNOSIS — Z87891 Personal history of nicotine dependence: Secondary | ICD-10-CM | POA: Diagnosis not present

## 2020-03-10 LAB — URINE DRUG SCREEN, QUALITATIVE (ARMC ONLY)
Amphetamines, Ur Screen: NOT DETECTED
Barbiturates, Ur Screen: NOT DETECTED
Benzodiazepine, Ur Scrn: NOT DETECTED
Cannabinoid 50 Ng, Ur ~~LOC~~: NOT DETECTED
Cocaine Metabolite,Ur ~~LOC~~: NOT DETECTED
MDMA (Ecstasy)Ur Screen: NOT DETECTED
Methadone Scn, Ur: NOT DETECTED
Opiate, Ur Screen: NOT DETECTED
Phencyclidine (PCP) Ur S: NOT DETECTED
Tricyclic, Ur Screen: NOT DETECTED

## 2020-03-10 LAB — COMPREHENSIVE METABOLIC PANEL
ALT: 26 U/L (ref 0–44)
AST: 52 U/L — ABNORMAL HIGH (ref 15–41)
Albumin: 3.2 g/dL — ABNORMAL LOW (ref 3.5–5.0)
Alkaline Phosphatase: 75 U/L (ref 38–126)
Anion gap: 8 (ref 5–15)
BUN: 7 mg/dL — ABNORMAL LOW (ref 8–23)
CO2: 22 mmol/L (ref 22–32)
Calcium: 9 mg/dL (ref 8.9–10.3)
Chloride: 114 mmol/L — ABNORMAL HIGH (ref 98–111)
Creatinine, Ser: 0.52 mg/dL (ref 0.44–1.00)
GFR calc Af Amer: >60 mL/min (ref >60)
GFR calc non Af Amer: >60 mL/min (ref >60)
Glucose, Bld: 118 mg/dL — ABNORMAL HIGH (ref 70–99)
Potassium: 4.1 mmol/L (ref 3.5–5.1)
Sodium: 144 mmol/L (ref 135–145)
Total Bilirubin: 4.4 mg/dL — ABNORMAL HIGH (ref 0.3–1.2)
Total Protein: 6.6 g/dL (ref 6.5–8.1)

## 2020-03-10 LAB — CBC
HCT: 28.6 % — ABNORMAL LOW (ref 36.0–46.0)
Hemoglobin: 9.2 g/dL — ABNORMAL LOW (ref 12.0–15.0)
MCH: 31.6 pg (ref 26.0–34.0)
MCHC: 32.2 g/dL (ref 30.0–36.0)
MCV: 98.3 fL (ref 80.0–100.0)
Platelets: 66 10*3/uL — ABNORMAL LOW (ref 150–400)
RBC: 2.91 MIL/uL — ABNORMAL LOW (ref 3.87–5.11)
RDW: 15.4 % (ref 11.5–15.5)
WBC: 4.5 10*3/uL (ref 4.0–10.5)
nRBC: 0 % (ref 0.0–0.2)

## 2020-03-10 LAB — ACETAMINOPHEN LEVEL: Acetaminophen (Tylenol), Serum: <10 ug/mL — ABNORMAL LOW (ref 10–30)

## 2020-03-10 LAB — ETHANOL: Alcohol, Ethyl (B): <10 mg/dL (ref <10)

## 2020-03-10 LAB — SALICYLATE LEVEL: Salicylate Lvl: <7 mg/dL — ABNORMAL LOW (ref 7.0–30.0)

## 2020-03-10 MED ORDER — POTASSIUM CHLORIDE CRYS ER 10 MEQ PO TBCR
10.0000 meq | EXTENDED_RELEASE_TABLET | Freq: Every day | ORAL | Status: DC
  Administered 2020-03-11: 10 meq via ORAL
  Filled 2020-03-10: qty 1

## 2020-03-10 MED ORDER — LACTULOSE 10 GM/15ML PO SOLN
20.0000 g | Freq: Every day | ORAL | Status: DC
  Administered 2020-03-10 – 2020-03-11 (×2): 20 g via ORAL
  Filled 2020-03-10 (×2): qty 30

## 2020-03-10 MED ORDER — ENSURE ENLIVE PO LIQD
237.0000 mL | Freq: Two times a day (BID) | ORAL | Status: DC
  Administered 2020-03-11: 237 mL via ORAL

## 2020-03-10 MED ORDER — FOLIC ACID 1 MG PO TABS
1.0000 mg | ORAL_TABLET | Freq: Every day | ORAL | Status: DC
  Administered 2020-03-10 – 2020-03-11 (×2): 1 mg via ORAL
  Filled 2020-03-10 (×2): qty 1

## 2020-03-10 MED ORDER — IRBESARTAN 150 MG PO TABS
300.0000 mg | ORAL_TABLET | Freq: Every day | ORAL | Status: DC
  Administered 2020-03-10 – 2020-03-11 (×2): 300 mg via ORAL
  Filled 2020-03-10 (×2): qty 2

## 2020-03-10 MED ORDER — QUETIAPINE FUMARATE 25 MG PO TABS
25.0000 mg | ORAL_TABLET | Freq: Three times a day (TID) | ORAL | Status: DC
  Administered 2020-03-10 – 2020-03-11 (×4): 25 mg via ORAL
  Filled 2020-03-10 (×4): qty 1

## 2020-03-10 MED ORDER — MEGESTROL ACETATE 400 MG/10ML PO SUSP
300.0000 mg | Freq: Two times a day (BID) | ORAL | Status: DC
  Administered 2020-03-11: 300 mg via ORAL
  Filled 2020-03-10 (×2): qty 10

## 2020-03-10 MED ORDER — HYDROCHLOROTHIAZIDE 25 MG PO TABS
12.5000 mg | ORAL_TABLET | Freq: Every day | ORAL | Status: DC
  Administered 2020-03-10 – 2020-03-11 (×2): 12.5 mg via ORAL
  Filled 2020-03-10 (×2): qty 1

## 2020-03-10 MED ORDER — ADULT MULTIVITAMIN W/MINERALS CH
1.0000 | ORAL_TABLET | Freq: Every day | ORAL | Status: DC
  Administered 2020-03-10 – 2020-03-11 (×2): 1 via ORAL
  Filled 2020-03-10 (×2): qty 1

## 2020-03-10 MED ORDER — THIAMINE HCL 100 MG PO TABS
50.0000 mg | ORAL_TABLET | Freq: Every day | ORAL | Status: DC
  Administered 2020-03-10 – 2020-03-11 (×2): 50 mg via ORAL
  Filled 2020-03-10 (×2): qty 1

## 2020-03-10 NOTE — ED Provider Notes (Addendum)
[facility] Emergency Department Provider Note  Time seen: 10:09 AM  I have reviewed the triage vital signs and the nursing notes.   HISTORY  Chief Complaint Psychiatric Evaluation   HPI Sheila Bowman is a 69 y.o. female with a past medical history of diabetes, hypertension, presents to the emergency department with [employer] [facility] police..  According to report the patient is worried that someone will kill her patient states she is currently staying with her daughter but the daughter and her husband are having fights.  Here the patient denies any SI HI.  When asked specifically about someone trying to kill her she states he has to go back home after this so she cannot say too much.   Patient denies any medical complaints today.  Past Medical History:  Diagnosis Date  . Diabetes mellitus without complication ([facility])   . Hypertension     Patient Active Problem List   Diagnosis Date Noted  . Goals of care, counseling/discussion   . DNR (do not resuscitate)   . Palliative care by specialist   . Failure to thrive in adult   . Acute metabolic encephalopathy 02/18/2020  . 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 Alcohol abuse with alcohol-induced disorder (H[facility]) 02/18/2020  . Hypokalemia 02/18/2020  . Type 2 diabetes mellitus without complication ([facility]) 02/18/2020  . Essential hypertension 02/18/2020    History reviewed. No pertinent surgical history.  Prior to Admission medications   Medication Sig Start Date End Date Taking? Authorizing Provider  feeding supplement, ENSURE ENLIVE, (ENSURE ENLIVE) LIQD Take 237 mLs by mouth 2 (two) times daily between meals. 02/25/20   Enedina Finner, MD  folic acid (FOLVITE) 1 MG tablet Take 1 tablet (1 mg total) by mouth daily. 02/26/20   Enedina Finner, MD  hydrochlorothiazide (HYDRODIURIL) 12.5 MG tablet Take 12.5 mg by mouth daily. 05/27/19 05/26/20  [provider]  lactulose (CHRONULAC) 10 GM/15ML  solution Take 30 mLs (20 g total) by mouth daily. 02/26/20   Enedina Finner, MD  megestrol (MEGACE) 400 MG/10ML suspension Take 7.5 mLs (300 mg total) by mouth 2 (two) times daily. 02/25/20   Enedina Finner, MD  Multiple Vitamin (MULTIVITAMIN WITH MINERALS) TABS tablet Take 1 tablet by mouth daily. 02/26/20   Enedina Finner, MD  olmesartan (BENICAR) 40 MG tablet Take 1 tablet (40 mg total) by mouth daily. 02/25/20 02/24/21  Enedina Finner, MD  potassium chloride (KLOR-CON) 10 MEQ tablet Take 10 mEq by mouth daily. 10/13/19   [provider]  thiamine 100 MG tablet Take 0.5 tablets (50 mg total) by mouth daily. 02/26/20   Enedina Finner, MD    No Known Allergies  No family history on file.  Social History Social History   Tobacco Use  . Smoking status: Former Games developer  . Smokeless tobacco: Never Used  Substance Use Topics  . Alcohol use: Yes    Comment: daily  . Drug use: Never    Review of Systems Constitutional: Negative for fever. Cardiovascular: Negative for chest pain. Respiratory: Negative for shortness of breath. Gastrointestinal: Negative for abdominal pain, vomiting and diarrhea. Musculoskeletal: Negative for musculoskeletal complaints Neurological: Negative for headache All other ROS negative  ____________________________________________   PHYSICAL EXAM:  VITAL SIGNS: ED Triage Vitals  Enc Vitals Group     BP 03/10/20 0848 (!) 161/84     Pulse Rate 03/10/20 0848 (!) 108     Resp 03/10/20 0848 18     Temp 03/10/20 0848 98.9 F (37.2 C)  Temp Source 03/10/20 0848 Oral     SpO2 03/10/20 0848 99 %     Weight 03/10/20 0857 144 lb (65.3 kg)     Height 03/10/20 0857 5' 5.5" (1.664 m)     Head Circumference --      Peak Flow --      Pain Score 03/10/20 0857 0     Pain Loc --      Pain Edu? --      Excl. in [place]? --     Constitutional: Patient is alert, answering questions and following commands.  No acute distress. Eyes: Normal exam ENT      Head: Normocephalic and  atraumatic.      Mouth/Throat: Mucous membranes are moist. Cardiovascular: Normal rate, regular rhythm Respiratory: Normal respiratory effort without tachypnea nor retractions. Breath sounds are clear Gastrointestinal: Soft and nontender. No distention.   Musculoskeletal: Nontender with normal Isenberg of motion in all extremities.  Neurologic:  Normal speech and language. No gross focal neurologic deficits Skin:  Skin is warm, dry and intact.  Psychiatric: Patient is very distractible, difficult to stay on topic.  Slow responses.  ____________________________________________   INITIAL IMPRESSION / ASSESSMENT AND PLAN / ED COURSE  Pertinent labs & imaging results that were available during my care of the patient were reviewed by me and considered in my medical decision making (see chart for details).   Presents to the emergency department under IVC for psychiatric evaluation stating that someone is trying to kill her.  Here the patient refuses to go into detail on that.  Patient is very distractible, very difficult to stay on topic.  Patient's responses are slow.  We will have psychiatry evaluate.  We will check labs and continue to closely monitor.  Psychiatry is seen and evaluated the patient.  Patient has a diagnosis of Wernicke's encephalopathy which is likely the cause of the patient's confusion and behavioral issues.  They do not believe that a psychiatric admission would be beneficial to the patient at this time.  We will consult social work to help with patient disposition as the patient does not currently have capacity to make her own decisions or safely care for herself in her current state.  Psychiatry will also reassess in the morning to see if family be willing to take home after treating with Seroquel today.  Sheila Bowman was evaluated in Emergency Department on 03/10/2020 for the symptoms described in the history of present illness. She was evaluated in the context of the global  COVID-19 pandemic, which necessitated consideration that the patient might be at risk for infection with the SARS-CoV-2 virus that causes COVID-19. Institutional protocols and algorithms that pertain to the evaluation of patients at risk for COVID-19 are in a state of rapid change based on information released by regulatory bodies including the CDC and federal and state organizations. These policies and algorithms were followed during the patient's care in the ED.  ____________________________________________   FINAL CLINICAL IMPRESSION(S) / ED DIAGNOSES  Confusion Paranoia    Harvest Dark, MD 03/10/20 1404    Harvest Dark, MD 03/10/20 1456

## 2020-03-10 NOTE — ED Notes (Signed)
Pt sitting in recliner calm and cooperative at this time,

## 2020-03-10 NOTE — ED Notes (Signed)
This tech received report from [employer] on this pt, this Clinical research associate visualized pt speaking with the nurse at this time. This Clinical research associate will continue with q 15 min checks.

## 2020-03-10 NOTE — ED Notes (Signed)
Pt up to restroom with no assistance needed from staff
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
## 2020-03-10 NOTE — ED Notes (Signed)
Pt speaking with TTS from [place].

## 2020-03-10 NOTE — ED Triage Notes (Signed)
Patient presents to the ED escorted by [facility] with voluntary commitment.  Patient seems paranoid in triage.  Patient states she is worried that someone will kill her.  Patient states she was staying with her daughter and states, "the anger in that household was so hot."  Patient using large words and being very vague.  Patient denies SI and denies visual or auditory hallucinations.

## 2020-03-10 NOTE — ED Notes (Signed)
Pt needing verbal direction to find the bathroom. Pt attempted to enter another patient's room.

## 2020-03-10 NOTE — ED Notes (Signed)
Pt does not want to speak with her daughter.  Pt states we do not have permission to give out any information to her daughter.

## 2020-03-10 NOTE — ED Notes (Signed)
Pt allowed to use the phone to call her son.

## 2020-03-10 NOTE — ED Notes (Signed)
Patient resting quietly in room. No noted distress or abnormal behaviors noted. Will continue 15 minute checks. 

## 2020-03-10 NOTE — ED Notes (Signed)
Purple coat Brown sweater Blue/green shirt Blue pants White socks [employer]

## 2020-03-10 NOTE — ED Notes (Signed)
Pt sat back in chair she was in before for "24 Sheila Bowman". Pt stated it was more comfortable for her to wait for her son instead of being in the room.

## 2020-03-10 NOTE — Consult Note (Signed)
[facility] Face-to-Face Psychiatry Consult   Reason for Consult: Behavioral concerns Referring Physician: Dr. Kerman Passey Patient Identification: Ridley Dileo MRN:  [id number] Principal Diagnosis: <principal problem not specified> Diagnosis:  Active Problems:   * No active hospital problems. *   Total Time spent with patient: 45 minutes  Subjective:   Alila Sotero is a 69 y.o. female patient admitted with behavioral concerns.  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  Today patient presents voluntary, with police escort due to concerns in the home.  Patient is unable to provide meaningful details as to the events that led up to her presentation.  Patient is not oriented times place or time.  Patient believes she is in [facility] as well as believes the year is 85.  Patient will ramble incessantly about irrelevant topics but is unable to answer specific questions about her recent past.  Patient is likely confabulating during these times.  She states that she is here in the emergency department because her daughter son-in-law became mad with each other.  She denies any suicidal or homicidal ideation.  She denies any psychotic symptoms.  Patient's daughter was contacted for collateral information as patient is currently living with her daughter and unable to be competent herself.  Per collateral information the patient is displaying a lot of dangerous behaviors in the home.  She has been placing utensils in the toaster and attempting to turn the toaster on.  She is also frequently confused and at times trying to urinate and defecate in the garbage cans around the house including in the kitchen.  The patient's daughter feels that the patient's behavior is unmanageable at this time and states that the patient will often become violent upon attempting  to redirect her.   Patient's daughter is agreeable with plan to try low-dose antipsychotic medication in attempt to assist with the patient's combative behavior.  We will plan to hold the patient overnight to ensure the medication is working without side effects and will reassess in the morning.  Daughter agreeable to this plan.    Past Psychiatric History: Patient has no significant past psychiatric history.  Risk to Self: Suicidal Ideation: No Suicidal Intent: No Is patient at risk for suicide?: No Suicidal Plan?: No Access to Means: No Triggers for Past Attempts: None known Intentional Self Injurious Behavior: None Risk to Others: Homicidal Ideation: No Thoughts of Harm to Others: No Current Homicidal Intent: No Current Homicidal Plan: No Access to Homicidal Means: No History of harm to others?: No Assessment of Violence: None Noted Does patient have access to weapons?: No Criminal Charges Pending?: No Does patient have a court date: No Prior Inpatient Therapy:   Prior Outpatient Therapy:    Past Medical History:  Past Medical History:  Diagnosis Date  . Diabetes mellitus without complication ([place])   . Hypertension    History reviewed. No pertinent surgical history. Family History: No family history on file. Family Psychiatric  History: Denies Social History:  Social History   Substance and Sexual Activity  Alcohol Use Yes   Comment: daily     Social History   Substance and Sexual Activity  Drug Use Never    Social History   Socioeconomic History  . Marital status: Single    Spouse name: Not on file  . Number of children: Not on file  . Years of education: Not on file  . Highest  education level: Not on file  Occupational History  . Not on file  Tobacco Use  . Smoking status: Former Games developermoker  . Smokeless tobacco: Never Used  Substance and Sexual Activity  . Alcohol use: Yes    Comment: daily  . Drug use: Never  . Sexual activity: Not Currently   Other Topics Concern  . Not on file  Social History Narrative  . Not on file   Social Determinants of Health   Financial Resource Strain:   . Difficulty of Paying Living Expenses:   Food Insecurity:   . Worried About Programme researcher, broadcasting/film/videounning Out of Food in the Last Year:   . Baristaan Out of Food in the Last Year:   Transportation Needs:   . Freight forwarderLack of Transportation (Medical):   Marland Kitchen. Lack of Transportation (Non-Medical):   Physical Activity:   . Days of Exercise per Week:   . Minutes of Exercise per Session:   Stress:   . Feeling of Stress :   Social Connections:   . Frequency of Communication with Friends and Family:   . Frequency of Social Gatherings with Friends and Family:   . Attends Religious Services:   . Active Member of Clubs or Organizations:   . Attends BankerClub or Organization Meetings:   Marland Kitchen. Marital Status:    Additional Social History:    Allergies:  No Known Allergies  Labs:  Results for orders placed or performed during the hospital encounter of 03/10/20 (from the past 48 hour(s))  Comprehensive metabolic panel     Status: Abnormal   Collection Time: 03/10/20  9:17 AM  Result Value Ref Funderburk   Sodium 144 135 - 145 mmol/L   Potassium 4.1 3.5 - 5.1 mmol/L    Comment: HEMOLYSIS AT THIS LEVEL MAY AFFECT RESULT   Chloride 114 (H) 98 - 111 mmol/L   CO2 22 22 - 32 mmol/L   Glucose, Bld 118 (H) 70 - 99 mg/dL    Comment: Glucose reference Creps applies only to samples taken after fasting for at least 8 hours.   BUN 7 (L) 8 - 23 mg/dL   Creatinine, Ser 1.610.52 0.44 - 1.00 mg/dL   Calcium 9.0 8.9 - 09.610.3 mg/dL   Total Protein 6.6 6.5 - 8.1 g/dL   Albumin 3.2 (L) 3.5 - 5.0 g/dL   AST 52 (H) 15 - 41 U/L   ALT 26 0 - 44 U/L   Alkaline Phosphatase 75 38 - 126 U/L   Total Bilirubin 4.4 (H) 0.3 - 1.2 mg/dL   GFR calc non Af Amer >60 >60 mL/min   GFR calc Af Amer >60 >60 mL/min   Anion gap 8 5 - 15    Comment: Performed at [facility], [address] [place], [place] [address]   Ethanol     Status: None   Collection Time: 03/10/20  9:17 AM  Result Value Ref Bertholf   Alcohol, Ethyl (B) <10 <10 mg/dL    Comment: (NOTE) Lowest detectable limit for serum alcohol is 10 mg/dL. For medical purposes only. Performed at [facility], [address] [place], [place] [address]   Salicylate level     Status: Abnormal   Collection Time: 03/10/20  9:17 AM  Result Value Ref Stevison   Salicylate Lvl <7.0 (L) 7.0 - 30.0 mg/dL    Comment: Performed at [facility], [address] [place], [place] [address]  Acetaminophen level     Status: Abnormal   Collection Time: 03/10/20  9:17 AM  Result Value Ref Lipuma   Acetaminophen (Tylenol), Serum <10 (L) 10 - 30 ug/mL    Comment: (NOTE) Therapeutic concentrations vary significantly. A Bertling of 10-30 ug/mL  may be an effective concentration for many patients. However, some  are best treated at concentrations outside of this [place]. Acetaminophen concentrations >150 ug/mL at 4 hours after ingestion  and >50 ug/mL at 12 hours after ingestion are often associated with  toxic reactions. Performed at [facility] [address] [place], [place] [address]   cbc     Status: Abnormal   Collection Time: 03/10/20  9:17 AM  Result Value Ref Ficek   WBC 4.5 4.0 - 10.5 K/uL   RBC 2.91 (L) 3.87 - 5.11 MIL/uL   Hemoglobin 9.2 (L) 12.0 - 15.0 g/dL   HCT 82.9 (L) 56.2 - 13.0 %   MCV 98.3 80.0 - 100.0 fL   MCH 31.6 26.0 - 34.0 pg   MCHC 32.2 30.0 - 36.0 g/dL   RDW 86.5 78.4 - 69.6 %   Platelets 66 (L) 150 - 400 K/uL    Comment: Immature Platelet Fraction may be clinically indicated, consider ordering this additional test [id number]    nRBC 0.0 0.0 - 0.2 %    Comment: Performed at [facility], [address], [place], [place] [address]  Urine Drug Screen, Qualitative     Status: None   Collection Time: 03/10/20  9:17 AM  Result Value Ref Rob[place]   Tricyclic, Ur Screen NONE DETECTED NONE DETECTED    Amphetamines, Ur Screen NONE DETECTED NONE DETECTED   MDMA (Ecstasy)Ur Screen NONE DETECTED NONE DETECTED   Cocaine Metabolite,Ur [place] NONE DETECTED NONE DETECTED   Opiate, Ur Screen NONE DETECTED NONE DETECTED   Phencyclidine (PCP) Ur S NONE DETECTED NONE DETECTED   Cannabinoid 50 Ng, Ur [place] NONE DETECTED NONE DETECTED   Barbiturates, Ur Screen NONE DETECTED NONE DETECTED   Benzodiazepine, Ur Scrn NONE DETECTED NONE DETECTED   Methadone Scn, Ur NONE DETECTED NONE DETECTED    Comment: (NOTE) Tricyclics + metabolites, urine    Cutoff 1000 ng/mL Amphetamines + metabolites, urine  Cutoff 1000 ng/mL MDMA (Ecstasy), urine              Cutoff 500 ng/mL Cocaine Metabolite, urine          Cutoff 300 ng/mL Opiate + metabolites, urine        Cutoff 300 ng/mL Phencyclidine (PCP), urine         Cutoff 25 ng/mL Cannabinoid, urine                 Cutoff 50 ng/mL Barbiturates + metabolites, urine  Cutoff 200 ng/mL Benzodiazepine, urine              Cutoff 200 ng/mL Methadone, urine                   Cutoff 300 ng/mL The urine drug screen provides only a preliminary, unconfirmed analytical test result and should not be used for non-medical purposes. Clinical consideration and professional judgment should be applied to any positive drug screen result due to possible interfering substances. A more specific alternate chemical method must be used in order to obtain a confirmed analytical result. Gas chromatography / mass spectrometry (GC/MS) is the preferred confirmat ory method. Performed at [facility], [address] [place], [place] [address]     Current Facility-Administered Medications  Medication Dose Route Frequency Provider Last Rate Last Admin  . feeding  supplement (ENSURE ENLIVE) (ENSURE ENLIVE) liquid 237 mL  237 mL Oral BID BM Cerita Rabelo, Worthy Rancher, MD      . folic acid (FOLVITE) tablet 1 mg  1 mg Oral Daily Hale Chalfin A, MD      . hydrochlorothiazide (HYDRODIURIL) tablet  12.5 mg  12.5 mg Oral Daily Jalaya Sarver A, MD      . irbesartan (AVAPRO) tablet 300 mg  300 mg Oral Daily Elizah Lydon A, MD      . lactulose (CHRONULAC) 10 GM/15ML solution 20 g  20 g Oral Daily Sherwin Hollingshed, Worthy Rancher, MD      . Melene Muller ON 03/11/2020] megestrol (MEGACE) 400 MG/10ML suspension 300 mg  300 mg Oral BID Demorris Choyce A, MD      . multivitamin with minerals tablet 1 tablet  1 tablet Oral Daily Emmerich Cryer, Worthy Rancher, MD      . Melene Muller ON 03/11/2020] potassium chloride (KLOR-CON) CR tablet 10 mEq  10 mEq Oral Daily Clementine Soulliere A, MD      . QUEtiapine (SEROQUEL) tablet 25 mg  25 mg Oral TID Aldea Avis A, MD      . thiamine tablet 50 mg  50 mg Oral Daily Decklyn Hornik, Worthy Rancher, MD       Current Outpatient Medications  Medication Sig Dispense Refill  . feeding supplement, ENSURE ENLIVE, (ENSURE ENLIVE) LIQD Take 237 mLs by mouth 2 (two) times daily between meals. 237 mL 12  . folic acid (FOLVITE) 1 MG tablet Take 1 tablet (1 mg total) by mouth daily. 30 tablet 0  . hydrochlorothiazide (HYDRODIURIL) 12.5 MG tablet Take 12.5 mg by mouth daily.    Marland Kitchen lactulose (CHRONULAC) 10 GM/15ML solution Take 30 mLs (20 g total) by mouth daily. 236 mL 0  . megestrol (MEGACE) 400 MG/10ML suspension Take 7.5 mLs (300 mg total) by mouth 2 (two) times daily. 240 mL 1  . Multiple Vitamin (MULTIVITAMIN WITH MINERALS) TABS tablet Take 1 tablet by mouth daily. 30 tablet 0  . olmesartan (BENICAR) 40 MG tablet Take 1 tablet (40 mg total) by mouth daily. 30 tablet 1  . potassium chloride (KLOR-CON) 10 MEQ tablet Take 10 mEq by mouth daily.    Marland Kitchen thiamine 100 MG tablet Take 0.5 tablets (50 mg total) by mouth daily. 30 tablet 0    Musculoskeletal: Strength & Muscle Tone: within normal limits Gait & Station: normal Patient leans: N/A  Psychiatric Specialty Exam: Physical Exam  Review of Systems  Blood pressure (!) 161/84, pulse (!) 108, temperature 98.9 F (37.2 C), temperature source Oral, resp. rate  18, height 5' 5.5" (1.664 m), weight 65.3 kg, SpO2 99 %.Body mass index is 23.6 kg/m.  General Appearance: Disheveled  Eye Contact:  Fair  Speech:  Normal Rate  Volume:  Normal  Mood:  Euthymic  Affect:  Labile  Thought Process:  Disorganized and Irrelevant  Orientation:  Negative  Thought Content:  Paranoid Ideation and Abstract Reasoning  Suicidal Thoughts:  No  Homicidal Thoughts:  No  Memory:  Recent;   Fair  Judgement:  Impaired  Insight:  Lacking  Psychomotor Activity:  Normal  Concentration:  Concentration: Poor  Recall:  Poor  Fund of Knowledge:  Poor  Language:  Fair  Akathisia:  No  Handed:  Right  AIMS (if indicated):     Assets:  Housing Social Support  ADL's:  Impaired  Cognition:  Impaired,  Severe  Sleep:        Treatment Plan Summary: This is  a 69 year old woman with diagnosis of dementia likely Warnicke's who presents with behavioral disturbance in her home.  Patient is currently cared by her daughter who provided collateral information and discussed the dangerous behavior as mentioned above.  Patient's daughter is still willing to care for the patient but is requesting medication assistance in order to be able to do so.  We will attempt to start a low-dose antipsychotic to aid in the combativeness of the patient's behavior when confused.  We will also provide outpatient resources when the patient is discharged.  Disposition:   Will restart patient on home medications, Will start patient on Seroquel 25 mg 3 times daily, We will plan to discharge tomorrow back to home if patient is without significant side effects and has improvement in behavior.  Social work consult to determine if anything more can be done to aid in patient's care in the home.  Patient does not meet criteria for inpatient psychiatric hospitalization due to her dementia diagnosis and lack of previous psychiatric history prior to.  Will attempt to start medications and keep patient in her  current home as she has social support as well as some home health services in place.     Clement Sayres, MD 03/10/2020 2:37 PM

## 2020-03-10 NOTE — ED Notes (Signed)
Pt provided with meal tray.

## 2020-03-10 NOTE — ED Notes (Signed)
Pt up using restroom with no assistance needed from staff  

## 2020-03-11 DIAGNOSIS — F22 Delusional disorders: Secondary | ICD-10-CM | POA: Diagnosis not present

## 2020-03-11 LAB — URINALYSIS, COMPLETE (UACMP) WITH MICROSCOPIC
Bacteria, UA: NONE SEEN
Bilirubin Urine: NEGATIVE
Glucose, UA: NEGATIVE mg/dL
Hgb urine dipstick: NEGATIVE
Ketones, ur: NEGATIVE mg/dL
Nitrite: NEGATIVE
Protein, ur: NEGATIVE mg/dL
Specific Gravity, Urine: 1.019 (ref 1.005–1.030)
pH: 7 (ref 5.0–8.0)

## 2020-03-11 MED ORDER — LACTULOSE 10 GM/15ML PO SOLN: 20.0000 g | Freq: Every day | ORAL | 0 refills | Status: DC

## 2020-03-11 MED ORDER — QUETIAPINE FUMARATE 25 MG PO TABS: 25.0000 mg | ORAL_TABLET | Freq: Three times a day (TID) | ORAL | 0 refills | Status: DC

## 2020-03-11 MED ORDER — ACETAMINOPHEN 325 MG PO TABS
650.0000 mg | ORAL_TABLET | Freq: Once | ORAL | Status: AC
  Administered 2020-03-11: 650 mg via ORAL
  Filled 2020-03-11: qty 2

## 2020-03-11 NOTE — ED Provider Notes (Signed)
-----------------------------------------   6:05 AM on 03/11/2020 -----------------------------------------   Blood pressure (!) 147/63, pulse 90, temperature 98 F (36.7 C), temperature source Oral, resp. rate 18, height 5' 5.5" (1.664 m), weight 65.3 kg, SpO2 100 %.  The patient is calm and cooperative at this time.  There have been no acute events since the last update.  Awaiting reassessment this morning from psychiatry team to determine further management and disposition.   Sharman Cheek, MD 03/11/20 [phone]

## 2020-03-11 NOTE — ED Notes (Signed)
Psychiatry at bedside.

## 2020-03-11 NOTE — ED Notes (Signed)
Spoke with pt. Pt asking about when she was going to be able to leave. Pt was informed that the plan is for her to go home and that her daughter is going to be here after 5 pm to pick her up. Pt informed that we will get her belongings for her when her daughter gets here.

## 2020-03-11 NOTE — TOC Initial Note (Signed)
Transition of Care [facility]) - Initial/Assessment Note    Patient Details  Name: Sheila Bowman MRN: [id number] Date of Birth: [birth year]  [facility]) CM/SW Contact:    [place] Cellar, RN Phone Number: 03/11/2020, 11:03 AM  Clinical Narrative:                 Spoke with daughter who states patient will be discharging home with [facility] and daughter will be moving in with her until they are able to place into ALF. Daughter is working with Lindaann Pascal, [place] @ [phone] for ALF placement.   Expected Discharge Plan: Home w Home Health Services Barriers to Discharge: Continued Medical Work up   Patient Goals and CMS Choice Patient states their goals for this hospitalization and ongoing recovery are:: Will go home with daughter      Expected Discharge Plan and Services Expected Discharge Plan: Home w Home Health Services       Living arrangements for the past 2 months: Single Family Home                           HH Arranged: OT, Nurse's Aide, PT, Social Work [facility] Agency: [facility] Date HH Agency Contacted: 03/11/20 Time HH Agency Contacted: 1058 Representative spoke with at [facility] Agency: [place]  Prior Living Arrangements/Services Living arrangements for the past 2 months: Single Family Home Lives with:: Adult Children(lives with daughter and son in law) Patient language and need for interpreter reviewed:: Yes Do you feel safe going back to the place where you live?: Yes   Daughter will be moving in with patient and continue working with [facility] for ALF placement  Need for Family Participation in Patient Care: Yes (Comment) Care giver support system in place?: Yes (comment) Current home services: DME(walker, cane) Criminal Activity/Legal Involvement Pertinent to Current Situation/Hospitalization: No - Comment as needed  Activities of Daily Living Home Assistive Devices/Equipment: None ADL Screening (condition at time of admission) Patient's cognitive  ability adequate to safely complete daily activities?: Yes Is the patient deaf or have difficulty hearing?: No Does the patient have difficulty seeing, even when wearing glasses/contacts?: No Does the patient have difficulty concentrating, remembering, or making decisions?: No Patient able to express need for assistance with ADLs?: Yes Does the patient have difficulty dressing or bathing?: No Independently performs ADLs?: Yes (appropriate for developmental age) Does the patient have difficulty walking or climbing stairs?: No Weakness of Legs: None Weakness of Arms/Hands: None  Permission Sought/Granted Permission sought to share information with : Family Supports Permission granted to share information with : Yes, Verbal Permission Granted              Emotional Assessment Appearance:: Appears older than stated age Attitude/Demeanor/Rapport: Unable to Assess Affect (typically observed): Unable to Assess Orientation: : Oriented to Self, Fluctuating Orientation (Suspected and/or reported Sundowners), Oriented to  Time Alcohol / Substance Use: Alcohol Use(Active alcohol abuse-intoxicated upon admission) Psych Involvement: Yes (comment)(Psych following)  Admission diagnosis:  vol comm Patient Active Problem List   Diagnosis Date Noted  . Goals of care, counseling/discussion   . DNR (do not resuscitate)   . Palliative care by specialist   . Failure to thrive in adult   . Acute metabolic encephalopathy 02/18/2020  . 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 Alcohol abuse with alcohol-induced disorder (HCC) 02/18/2020  . Hypokalemia 02/18/2020  . Type 2 diabetes mellitus without complication (HCC) 02/18/2020  . Essential  hypertension 02/18/2020   PCP:  [facility], [place]:   [place], [place] [address] [place] [address] Phone: [phone] Fax: [place], [place] [address] [place] [address] Phone: [phone] Fax: [phone]     Social Determinants of Health (SDOH) Interventions    Readmission Risk Interventions No flowsheet data found.

## 2020-03-11 NOTE — ED Provider Notes (Signed)
-----------------------------------------   6:16 PM on 03/11/2020 -----------------------------------------  The patient was cleared for discharge by Dr. Cindi Carbon and the patient's daughter felt comfortable taking her home and managing her there.  Of note, on a routine recheck of vital signs the patient was noted to have a temperature of 100.4.  She had no symptoms at this time.  I obtained a urinalysis, which is negative.  The patient has no signs or symptoms of infection.  Her WBC count was normal when checked yesterday.  At this time, there is no indication for further ED work-up.  The temperature is now normal after Tylenol.  We will inform the daughter of this finding and instruct her to monitor the patient.  Return precautions provided.   Dionne Bucy, MD 03/11/20 1819

## 2020-03-11 NOTE — ED Notes (Signed)
Pt daughter contacted by this nurse, daughter states she is 5 minutes out. Pt changing into her clothes and all items returned to her at this time.

## 2020-03-11 NOTE — Discharge Instructions (Addendum)
Take the medication prescribed by Dr. Cindi Carbon.  Follow-up with the primary care physician.  Return to the ER for any new or worsening symptoms especially any acute danger to self or others, worsening mental status, or any other symptoms that concern you.

## 2020-03-11 NOTE — ED Notes (Signed)
Pt ambulated to restroom without difficulty or distress.

## 2020-03-11 NOTE — TOC Progression Note (Signed)
Transition of Care [facility]) - Progression Note    Patient Details  Name: Sheila Bowman MRN: [id number] Date of Birth: 07-Jul-1951  Transition [facility] Care [facility]) CM/SW Contact  [place] Cellar, RN Phone Number: 03/11/2020, 11:32 AM  Clinical Narrative:    Sherron Monday with [place] @ [facility] to confirm patient is followed by [facility] RN, PT, OT and SW. SW is working on placement into ALF.   Expected Discharge Plan: Home w Home Health Services Barriers to Discharge: Continued Medical Work up  Expected Discharge Plan and Services Expected Discharge Plan: Home w Home Health Services       Living arrangements for the past 2 months: Single Family Home                           HH Arranged: OT, Nurse's Aide, PT, Social Work [facility] Agency: [facility] Date HH Agency Contacted: 03/11/20 Time HH Agency Contacted: 1058 Representative spoke with at [facility] Agency: [place]   Social Determinants of Health (SDOH) Interventions    Readmission Risk Interventions No flowsheet data found.

## 2020-03-11 NOTE — Consult Note (Signed)
  Spoke to patient's daughter [name]) and discussed the plan for patient to return home today with prescription for 3 times daily Seroquel. Daughter was instructed to always give the nightly dose, and to use the other 2 doses as needed. Daughter in agreement and expressed understanding. Stated that she has rearranged part of her mother's house to which mother will return. Daughter also stated she will be living with and supervising mother during this time.  Daughter was informed about the progression of this disease and how if patient becomes unmanageable will likely require locked dementia facility. Daughter stated that she currently has Child psychotherapist looking for placement in assisted living facilities.  Daughter will be here to pick up mother after 5 PM today.  Patient to be discharged

## 2020-03-11 NOTE — ED Notes (Signed)
Pt given meal tray.

## 2020-03-11 NOTE — ED Notes (Signed)
VOL, to be D/C'd

## 2020-03-11 NOTE — ED Notes (Signed)
Pt signed physical copy of dischrage paperwork.

## 2020-03-11 NOTE — Care Management (Signed)
RN CM: TOC aware of consult-awaiting recommendations from psychiatric team.

## 2020-03-11 NOTE — ED Notes (Signed)
Pt given meal tray by Dorathy Daft, tech.

## 2020-03-19 ENCOUNTER — Other Ambulatory Visit: Payer: Self-pay

## 2020-03-19 ENCOUNTER — Emergency Department: Payer: Medicare Other

## 2020-03-19 ENCOUNTER — Inpatient Hospital Stay
Admission: EM | Admit: 2020-03-19 | Discharge: 2020-04-05 | DRG: 432 | Disposition: A | Discharge status: Skilled Nursing Facility | Payer: Medicare Other | Attending: Internal Medicine | Admitting: Internal Medicine

## 2020-03-19 DIAGNOSIS — E872 Acidosis: Secondary | ICD-10-CM | POA: Diagnosis not present

## 2020-03-19 DIAGNOSIS — Z515 Encounter for palliative care: Secondary | ICD-10-CM

## 2020-03-19 DIAGNOSIS — R9389 Abnormal findings on diagnostic imaging of other specified body structures: Secondary | ICD-10-CM | POA: Diagnosis present

## 2020-03-19 DIAGNOSIS — R627 Adult failure to thrive: Secondary | ICD-10-CM | POA: Diagnosis present

## 2020-03-19 DIAGNOSIS — I119 Hypertensive heart disease without heart failure: Secondary | ICD-10-CM | POA: Diagnosis present

## 2020-03-19 DIAGNOSIS — I10 Essential (primary) hypertension: Secondary | ICD-10-CM | POA: Diagnosis present

## 2020-03-19 DIAGNOSIS — F10239 Alcohol dependence with withdrawal, unspecified: Secondary | ICD-10-CM | POA: Diagnosis present

## 2020-03-19 DIAGNOSIS — D539 Nutritional anemia, unspecified: Secondary | ICD-10-CM | POA: Diagnosis present

## 2020-03-19 DIAGNOSIS — Z978 Presence of other specified devices: Secondary | ICD-10-CM

## 2020-03-19 DIAGNOSIS — F0390 Unspecified dementia without behavioral disturbance: Secondary | ICD-10-CM | POA: Diagnosis present

## 2020-03-19 DIAGNOSIS — R4182 Altered mental status, unspecified: Secondary | ICD-10-CM | POA: Diagnosis not present

## 2020-03-19 DIAGNOSIS — G9341 Metabolic encephalopathy: Secondary | ICD-10-CM | POA: Diagnosis present

## 2020-03-19 DIAGNOSIS — Z87891 Personal history of nicotine dependence: Secondary | ICD-10-CM

## 2020-03-19 DIAGNOSIS — Z8249 Family history of ischemic heart disease and other diseases of the circulatory system: Secondary | ICD-10-CM

## 2020-03-19 DIAGNOSIS — F1010 Alcohol abuse, uncomplicated: Secondary | ICD-10-CM | POA: Diagnosis present

## 2020-03-19 DIAGNOSIS — Z20822 Contact with and (suspected) exposure to covid-19: Secondary | ICD-10-CM | POA: Diagnosis present

## 2020-03-19 DIAGNOSIS — K7040 Alcoholic hepatic failure without coma: Secondary | ICD-10-CM | POA: Diagnosis not present

## 2020-03-19 DIAGNOSIS — E876 Hypokalemia: Secondary | ICD-10-CM | POA: Diagnosis not present

## 2020-03-19 DIAGNOSIS — Z66 Do not resuscitate: Secondary | ICD-10-CM | POA: Diagnosis present

## 2020-03-19 DIAGNOSIS — F1026 Alcohol dependence with alcohol-induced persisting amnestic disorder: Secondary | ICD-10-CM | POA: Diagnosis present

## 2020-03-19 DIAGNOSIS — K7682 Hepatic encephalopathy: Secondary | ICD-10-CM | POA: Diagnosis present

## 2020-03-19 DIAGNOSIS — D649 Anemia, unspecified: Secondary | ICD-10-CM | POA: Diagnosis present

## 2020-03-19 DIAGNOSIS — K7200 Acute and subacute hepatic failure without coma: Secondary | ICD-10-CM | POA: Diagnosis present

## 2020-03-19 DIAGNOSIS — F1096 Alcohol use, unspecified with alcohol-induced persisting amnestic disorder: Secondary | ICD-10-CM | POA: Diagnosis present

## 2020-03-19 DIAGNOSIS — K7460 Unspecified cirrhosis of liver: Secondary | ICD-10-CM | POA: Diagnosis present

## 2020-03-19 DIAGNOSIS — R0600 Dyspnea, unspecified: Secondary | ICD-10-CM

## 2020-03-19 DIAGNOSIS — Z6821 Body mass index (BMI) 21.0-21.9, adult: Secondary | ICD-10-CM

## 2020-03-19 DIAGNOSIS — Z4659 Encounter for fitting and adjustment of other gastrointestinal appliance and device: Secondary | ICD-10-CM

## 2020-03-19 DIAGNOSIS — R569 Unspecified convulsions: Secondary | ICD-10-CM | POA: Diagnosis not present

## 2020-03-19 DIAGNOSIS — K7031 Alcoholic cirrhosis of liver with ascites: Secondary | ICD-10-CM | POA: Diagnosis present

## 2020-03-19 DIAGNOSIS — E119 Type 2 diabetes mellitus without complications: Secondary | ICD-10-CM | POA: Diagnosis present

## 2020-03-19 LAB — CBC WITH DIFFERENTIAL/PLATELET
Abs Immature Granulocytes: 0.01 10*3/uL (ref 0.00–0.07)
Basophils Absolute: 0 10*3/uL (ref 0.0–0.1)
Basophils Relative: 1 %
Eosinophils Absolute: 0.2 10*3/uL (ref 0.0–0.5)
Eosinophils Relative: 6 %
HCT: 21.7 % — ABNORMAL LOW (ref 36.0–46.0)
Hemoglobin: 6.6 g/dL — ABNORMAL LOW (ref 12.0–15.0)
Immature Granulocytes: 0 %
Lymphocytes Relative: 26 %
Lymphs Abs: 1 10*3/uL (ref 0.7–4.0)
MCH: 31.3 pg (ref 26.0–34.0)
MCHC: 30.4 g/dL (ref 30.0–36.0)
MCV: 102.8 fL — ABNORMAL HIGH (ref 80.0–100.0)
Monocytes Absolute: 0.5 10*3/uL (ref 0.1–1.0)
Monocytes Relative: 14 %
Neutro Abs: 2 10*3/uL (ref 1.7–7.7)
Neutrophils Relative %: 53 %
Platelets: 49 10*3/uL — ABNORMAL LOW (ref 150–400)
RBC: 2.11 MIL/uL — ABNORMAL LOW (ref 3.87–5.11)
RDW: 15.5 % (ref 11.5–15.5)
WBC: 3.8 10*3/uL — ABNORMAL LOW (ref 4.0–10.5)
nRBC: 0 % (ref 0.0–0.2)

## 2020-03-19 LAB — AMMONIA: Ammonia: 14 umol/L (ref 9–35)

## 2020-03-19 MED ORDER — SODIUM CHLORIDE 0.9 % IV SOLN: 10.0000 mL/h | Freq: Once | INTRAVENOUS | Status: DC

## 2020-03-19 MED ORDER — IOHEXOL 9 MG/ML PO SOLN
500.0000 mL | Freq: Two times a day (BID) | ORAL | Status: DC | PRN
  Filled 2020-03-19: qty 500

## 2020-03-19 MED ORDER — SODIUM CHLORIDE 0.9 % IV BOLUS
1000.0000 mL | Freq: Once | INTRAVENOUS | Status: AC
  Administered 2020-03-19: 1000 mL via INTRAVENOUS

## 2020-03-19 NOTE — ED Provider Notes (Signed)
[facility] EMERGENCY DEPARTMENT Provider Note   CSN: [id number] Arrival date & time: 03/19/20  2207     History Chief Complaint  Patient presents with  . Altered Mental Status    Lenka Zhao is a 69 y.o. 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 Patient was recently admitted for alcohol withdrawal and was diagnosed with Wernicke Korsakoff syndrome. She came to the ED last week for hallucinations and was evaluated and then sent home. Today, she was altered and vomited. She also was confused and couldn't recognize daughter. She also had slurred speech as well but this is common with her dementia diagnosis.  Patient was given Versed prior to arrival by EMS.  The history is provided by the EMS personnel.  Level V caveat- dementia      Past Medical History:  Diagnosis Date  . Diabetes mellitus without complication (HCC)   . Hypertension     Patient Active Problem List   Diagnosis Date Noted  . Goals of care, counseling/discussion   . DNR (do not resuscitate)   . Palliative care by specialist   . Failure to thrive in adult   . Acute metabolic encephalopathy 02/18/2020  . 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 Alcohol abuse with alcohol-induced disorder (HCC) 02/18/2020  . Hypokalemia 02/18/2020  . Type 2 diabetes mellitus without complication (HCC) 02/18/2020  . Essential hypertension 02/18/2020    History reviewed. No pertinent surgical history.   OB History   No obstetric history on file.     History reviewed. No pertinent family history.  Social History   Tobacco Use  . Smoking status: Former Games developer  . Smokeless tobacco: Never Used  Substance Use Topics  . Alcohol use: Yes    Comment: daily  . Drug use: Never    Home Medications Prior to Admission medications   Medication Sig Start Date End Date Taking? Authorizing Provider  feeding supplement, ENSURE ENLIVE,  (ENSURE ENLIVE) LIQD Take 237 mLs by mouth 2 (two) times daily between meals. 02/25/20   Enedina Finner, MD  folic acid (FOLVITE) 1 MG tablet Take 1 tablet (1 mg total) by mouth daily. 02/26/20   Enedina Finner, MD  hydrochlorothiazide (HYDRODIURIL) 12.5 MG tablet Take 12.5 mg by mouth daily. 05/27/19 05/26/20  [provider]  lactulose (CHRONULAC) 10 GM/15ML solution Take 30 mLs (20 g total) by mouth daily. 03/11/20   Cristofano, Worthy Rancher, MD  megestrol (MEGACE) 400 MG/10ML suspension Take 7.5 mLs (300 mg total) by mouth 2 (two) times daily. Patient not taking: Reported on 03/11/2020 02/25/20   Enedina Finner, MD  Multiple Vitamin (MULTIVITAMIN WITH MINERALS) TABS tablet Take 1 tablet by mouth daily. 02/26/20   Enedina Finner, MD  olmesartan (BENICAR) 40 MG tablet Take 1 tablet (40 mg total) by mouth daily. 02/25/20 02/24/21  Enedina Finner, MD  potassium chloride (KLOR-CON) 10 MEQ tablet Take 10 mEq by mouth daily. 10/13/19   [provider]  QUEtiapine (SEROQUEL) 25 MG tablet Take 1 tablet (25 mg total) by mouth 3 (three) times daily. 03/11/20   Cristofano, Worthy Rancher, MD  thiamine 100 MG tablet Take 0.5 tablets (50 mg total) by mouth daily. Patient not taking: Reported on 03/11/2020 02/26/20   Enedina Finner, MD    Allergies    Patient has no known allergies.  Review of Systems   Review of Systems  Psychiatric/Behavioral: Positive for confusion.  All other systems reviewed and are negative.  Physical Exam Updated Vital Signs BP (!) 191/83 (BP Location: Left Arm)   Pulse 83   Temp 97.7 F (36.5 C) (Oral)   Resp 18   Ht 5\' 5"  (1.651 m)   Wt 61.3 kg   SpO2 99%   BMI 22.49 kg/m   Physical Exam Vitals and nursing note reviewed.  Constitutional:      Comments: Confused   HENT:     Head: Normocephalic.     Nose: Nose normal.     Mouth/Throat:     Mouth: Mucous membranes are moist.  Eyes:     Extraocular Movements: Extraocular movements intact.     Pupils: Pupils are equal, round, and  reactive to light.  Cardiovascular:     Rate and Rhythm: Normal rate and regular rhythm.     Pulses: Normal pulses.  Pulmonary:     Effort: Pulmonary effort is normal.     Breath sounds: Normal breath sounds.  Abdominal:     Comments: ? Fluid wave, mild diffuse tenderness   Musculoskeletal:        General: Normal Ferber of motion.     Cervical back: Normal Nunn of motion.  Skin:    General: Skin is warm.     Capillary Refill: Capillary refill takes less than 2 seconds.  Neurological:     Comments: Confused, difficulty answering questions. Moving all extremities   Psychiatric:     Comments: Agitated      ED Results / Procedures / Treatments   Labs (all labs ordered are listed, but only abnormal results are displayed) Labs Reviewed  URINE CULTURE  CBC WITH DIFFERENTIAL/PLATELET  COMPREHENSIVE METABOLIC PANEL  CK  AMMONIA  VITAMIN B1  VITAMIN B12  URINALYSIS, COMPLETE (UACMP) WITH MICROSCOPIC  TROPONIN I (HIGH SENSITIVITY)    EKG EKG Interpretation  Date/Time:  Saturday March 19 2020 22:13:30 EDT Ventricular Rate:  84 PR Interval:    QRS Duration: 134 QT Interval:  411 QTC Calculation: 483 R Axis:   51 Text Interpretation: Ectopic atrial rhythm Probable left ventricular hypertrophy Nonspecific T abnrm, anterolateral leads No significant change since last tracing Confirmed by 01-14-1987 [phone]) on 03/19/2020 10:16:06 PM   Radiology DG Chest Port 1 View  Result Date: 03/19/2020 CLINICAL DATA:  Altered mental status EXAM: PORTABLE CHEST 1 VIEW COMPARISON:  None. FINDINGS: Low lung volumes with streaky opacities favoring atelectasis. No pneumothorax or effusion. No convincing features of edema. Slight prominence of the cardiac silhouette possibly related to portable technique. The aorta is calcified. The remaining cardiomediastinal contours are unremarkable. No acute osseous or soft tissue abnormality. Degenerative changes are present in the imaged spine and shoulders.  Telemetry leads overlie the chest. IMPRESSION: Streaky basilar opacities favor atelectasis in the setting of low lung volumes. Aortic Atherosclerosis (ICD10-I70.0). Electronically Signed   By: 03/21/2020 M.D.   On: 03/19/2020 22:38    Procedures Procedures (including critical care time)   Medications Ordered in ED Medications  sodium chloride 0.9 % bolus 1,000 mL (1,000 mLs Intravenous New Bag/Given 03/19/20 2226)    ED Course  I have reviewed the triage vital signs and the nursing notes.  Pertinent labs & imaging results that were available during my care of the patient were reviewed by me and considered in my medical decision making (see chart for details).    MDM Rules/Calculators/A&P                      Lynnita Capelle is a  69 y.o. female history of dementia and possible Korsakoff syndrome here presenting with altered mental status.  Patient appears very confused. Patient is former alcoholic.  Per the daughter, patient has not been drinking any alcohol recently.  Patient unable to answer much questions at all.  Consider hepatic encephalopathy versus stroke versus bleed versus sepsis.  Will get CT head and labs and urinalysis.  She has not been taking her thiamine so we will get a B1 and B12 tablets as well.  11 PM Hg 6.6. Chemistry pending. Ordered 1 U PRBC. Anticipate admission after labs results. Signed out to Dr. Owens Shark in the ED.   11:26 PM Lab called saying that her labs are diluted with saline. Will repeat labs and if her labs are baseline, will need case management consult for placement. Dr. Owens Shark aware.      Final Clinical Impression(s) / ED Diagnoses Final diagnoses:  None    Rx / DC Orders ED Discharge Orders    None       Drenda Freeze, MD 03/19/20 2333

## 2020-03-19 NOTE — ED Notes (Signed)
The following lab tubes were drawn by this RN and sent to the lab. Lab called and informed that we were unable to place a yellow sticker on some specimens although a white label with my EID and correct time and date was written onto those labels.  Red top Tiger top Lt green Green with black ring (placed on ice)  Lavender top Blue top

## 2020-03-19 NOTE — ED Notes (Signed)
Lab requests redraw of labs with suspicion of saline contamination in tubes. Blood work will be recollected by this nurse when pt returns from CT

## 2020-03-19 NOTE — ED Notes (Addendum)
A fall alarm was placed underneath pt. Curtains pulled back to ensure added safety and lights were dimmed to aid in comfort

## 2020-03-19 NOTE — ED Notes (Signed)
PT to CT.

## 2020-03-19 NOTE — ED Notes (Signed)
Sheila Bowman [phone] gave verbal consent for [facility] to recv blood product.  She requested we call her at [phone] in the future.

## 2020-03-19 NOTE — ED Triage Notes (Signed)
Pt comes to ED via EMS due to being altered mental status at home. Pt is reported to have been altered, agitated, and confused PTA. Ems administered 2mg  versed PTA for relaxation.   PT recently medically detoxed alcohol and has since been dc home to family care.

## 2020-03-20 ENCOUNTER — Encounter: Payer: Self-pay | Admitting: Radiology

## 2020-03-20 ENCOUNTER — Emergency Department: Payer: Medicare Other

## 2020-03-20 LAB — URINALYSIS, COMPLETE (UACMP) WITH MICROSCOPIC
Bacteria, UA: NONE SEEN
Bilirubin Urine: NEGATIVE
Glucose, UA: NEGATIVE mg/dL
Hgb urine dipstick: NEGATIVE
Ketones, ur: NEGATIVE mg/dL
Leukocytes,Ua: NEGATIVE
Nitrite: NEGATIVE
Protein, ur: NEGATIVE mg/dL
Specific Gravity, Urine: 1.004 — ABNORMAL LOW (ref 1.005–1.030)
Squamous Epithelial / HPF: NONE SEEN (ref 0–5)
pH: 7 (ref 5.0–8.0)

## 2020-03-20 LAB — CBC
HCT: 30 % — ABNORMAL LOW (ref 36.0–46.0)
Hemoglobin: 9.5 g/dL — ABNORMAL LOW (ref 12.0–15.0)
MCH: 31.4 pg (ref 26.0–34.0)
MCHC: 31.7 g/dL (ref 30.0–36.0)
MCV: 99 fL (ref 80.0–100.0)
Platelets: 71 10*3/uL — ABNORMAL LOW (ref 150–400)
RBC: 3.03 MIL/uL — ABNORMAL LOW (ref 3.87–5.11)
RDW: 15.5 % (ref 11.5–15.5)
WBC: 5.9 10*3/uL (ref 4.0–10.5)
nRBC: 0 % (ref 0.0–0.2)

## 2020-03-20 LAB — COMPREHENSIVE METABOLIC PANEL
ALT: 23 U/L (ref 0–44)
AST: 44 U/L — ABNORMAL HIGH (ref 15–41)
Albumin: 3.1 g/dL — ABNORMAL LOW (ref 3.5–5.0)
Alkaline Phosphatase: 70 U/L (ref 38–126)
Anion gap: 4 — ABNORMAL LOW (ref 5–15)
BUN: 8 mg/dL (ref 8–23)
CO2: 22 mmol/L (ref 22–32)
Calcium: 8.6 mg/dL — ABNORMAL LOW (ref 8.9–10.3)
Chloride: 119 mmol/L — ABNORMAL HIGH (ref 98–111)
Creatinine, Ser: 0.55 mg/dL (ref 0.44–1.00)
GFR calc Af Amer: >60 mL/min (ref >60)
GFR calc non Af Amer: >60 mL/min (ref >60)
Glucose, Bld: 102 mg/dL — ABNORMAL HIGH (ref 70–99)
Potassium: 3.5 mmol/L (ref 3.5–5.1)
Sodium: 145 mmol/L (ref 135–145)
Total Bilirubin: 5.1 mg/dL — ABNORMAL HIGH (ref 0.3–1.2)
Total Protein: 6.2 g/dL — ABNORMAL LOW (ref 6.5–8.1)

## 2020-03-20 LAB — TYPE AND SCREEN
ABO/RH(D): O POS
Antibody Screen: NEGATIVE

## 2020-03-20 LAB — TROPONIN I (HIGH SENSITIVITY)
Troponin I (High Sensitivity): 23 ng/L — ABNORMAL HIGH (ref <18)
Troponin I (High Sensitivity): 28 ng/L — ABNORMAL HIGH (ref <18)

## 2020-03-20 LAB — CK: Total CK: 344 U/L — ABNORMAL HIGH (ref 38–234)

## 2020-03-20 LAB — VITAMIN B12: Vitamin B-12: 142 pg/mL — ABNORMAL LOW (ref 180–914)

## 2020-03-20 MED ORDER — IRBESARTAN 150 MG PO TABS
300.0000 mg | ORAL_TABLET | Freq: Every day | ORAL | Status: DC
  Administered 2020-03-21 – 2020-03-26 (×6): 300 mg via ORAL
  Filled 2020-03-20 (×6): qty 2

## 2020-03-20 MED ORDER — ENSURE ENLIVE PO LIQD
237.0000 mL | Freq: Two times a day (BID) | ORAL | Status: DC
  Administered 2020-03-22 – 2020-03-26 (×6): 237 mL via ORAL

## 2020-03-20 MED ORDER — POTASSIUM CHLORIDE CRYS ER 10 MEQ PO TBCR
10.0000 meq | EXTENDED_RELEASE_TABLET | Freq: Every day | ORAL | Status: DC
  Administered 2020-03-21 – 2020-03-26 (×6): 10 meq via ORAL
  Filled 2020-03-20 (×7): qty 1

## 2020-03-20 MED ORDER — HYDROCHLOROTHIAZIDE 25 MG PO TABS
12.5000 mg | ORAL_TABLET | Freq: Every day | ORAL | Status: DC
  Administered 2020-03-21 – 2020-03-26 (×6): 12.5 mg via ORAL
  Filled 2020-03-20 (×6): qty 1

## 2020-03-20 MED ORDER — FOLIC ACID 1 MG PO TABS
1.0000 mg | ORAL_TABLET | Freq: Every day | ORAL | Status: DC
  Administered 2020-03-21 – 2020-03-26 (×6): 1 mg via ORAL
  Filled 2020-03-20 (×6): qty 1

## 2020-03-20 MED ORDER — ADULT MULTIVITAMIN W/MINERALS CH
1.0000 | ORAL_TABLET | Freq: Every day | ORAL | Status: DC
  Administered 2020-03-21 – 2020-03-26 (×6): 1 via ORAL
  Filled 2020-03-20 (×6): qty 1

## 2020-03-20 MED ORDER — SODIUM CHLORIDE 0.9 % IV BOLUS
1000.0000 mL | Freq: Once | INTRAVENOUS | Status: AC
  Administered 2020-03-20: 1000 mL via INTRAVENOUS

## 2020-03-20 MED ORDER — THIAMINE HCL 100 MG PO TABS
50.0000 mg | ORAL_TABLET | Freq: Every day | ORAL | Status: DC
  Administered 2020-03-21 – 2020-03-26 (×6): 50 mg via ORAL
  Filled 2020-03-20 (×6): qty 1

## 2020-03-20 MED ORDER — LACTULOSE 10 GM/15ML PO SOLN
20.0000 g | Freq: Every day | ORAL | Status: DC
  Administered 2020-03-21 – 2020-03-26 (×6): 20 g via ORAL
  Filled 2020-03-20 (×6): qty 30

## 2020-03-20 MED ORDER — IOHEXOL 300 MG/ML  SOLN
75.0000 mL | Freq: Once | INTRAMUSCULAR | Status: AC | PRN
  Administered 2020-03-20: 75 mL via INTRAVENOUS

## 2020-03-20 MED ORDER — QUETIAPINE FUMARATE 25 MG PO TABS
25.0000 mg | ORAL_TABLET | Freq: Every day | ORAL | Status: DC
  Administered 2020-03-21 – 2020-03-23 (×4): 25 mg via ORAL
  Filled 2020-03-20 (×4): qty 1

## 2020-03-20 MED ORDER — MEGESTROL ACETATE 400 MG/10ML PO SUSP
300.0000 mg | Freq: Two times a day (BID) | ORAL | Status: DC
  Administered 2020-03-21 – 2020-03-26 (×11): 300 mg via ORAL
  Filled 2020-03-20 (×12): qty 10

## 2020-03-20 NOTE — ED Notes (Signed)
Pt cleansed of incontinent urine. New sheets, gown and blanket provided.

## 2020-03-20 NOTE — ED Notes (Signed)
Pt back from CT now

## 2020-03-20 NOTE — ED Provider Notes (Signed)
-----------------------------------------   7:48 AM on 03/20/2020 -----------------------------------------   Blood pressure (!) 173/75, pulse 96, temperature 97.7 F (36.5 C), temperature source Oral, resp. rate (!) 23, height 5\' 5"  (1.651 m), weight 61.3 kg, SpO2 98 %.  There have been no acute events since the last update.  Awaiting disposition plan from Behavioral Medicine and/or Social Work team(s).    , MD 03/20/20 [phone]

## 2020-03-20 NOTE — ED Notes (Signed)
Pt is altered at this time. Pt is alert oriented to none. Pt will not follow commands. Reported to be A&Ox4 at baseline

## 2020-03-20 NOTE — ED Notes (Signed)
Assisted pt to toilet to void 

## 2020-03-20 NOTE — ED Notes (Signed)
Pt resting quietly at this time. Respirations equal and unlabored, no distress noted at this time. Denies any needs. Pt has been eating and drinking and using the bathroom per EDT.  Sitter at bedside.  Will continue to monitor.

## 2020-03-20 NOTE — ED Notes (Signed)
Assisted pt to bathroom

## 2020-03-20 NOTE — ED Notes (Signed)
Pt up to bathroom via 1 person assist.

## 2020-03-20 NOTE — ED Notes (Signed)
Pt given cup of gingerale at this time. Pt denies any pain, pt reports that her family was going to order pizza.  Pt is alert to self, aware of who the president is but is confused about why she is here and the year.

## 2020-03-20 NOTE — ED Notes (Addendum)
Pt to toilet via 1 person assist.

## 2020-03-20 NOTE — ED Notes (Addendum)
Pt given meal tray.

## 2020-03-20 NOTE — ED Notes (Signed)
While pt was eating pt became distracted and bit down on what she thought was food and pulled. This pulled pt's RT hand IV out. Nurse was notified and site was cleaned and bandaged.

## 2020-03-20 NOTE — ED Notes (Signed)
Pt is more awake and alert. Pt is still eating from her meal tray.

## 2020-03-20 NOTE — ED Notes (Signed)
Called SW re: if she has seen this patient yet, she is planning to call patient's daughter [name] to discuss further.

## 2020-03-20 NOTE — ED Notes (Signed)
Assisted to the toilet.

## 2020-03-20 NOTE — ED Notes (Signed)
assisted pt to toilet. Stand by assist.

## 2020-03-20 NOTE — ED Notes (Signed)
Blood redrawn by this nurse via new butterfly stick with no IV fluids going in arm and no meds administered in this arm. Sent to lab. Pt changed of incontinence and complete bed change assisted by Melina Copa and April, RN

## 2020-03-20 NOTE — ED Notes (Signed)
Pt given dinner meal tray. Assisted with set up. Pt is currently sitting on the side of the bed.

## 2020-03-20 NOTE — ED Notes (Signed)
Pt alert and talking, no distress noted, disposition pending at this time.  Sitter at bedside.

## 2020-03-20 NOTE — ED Notes (Signed)
Pt again removed items on person, attempting to get out of bed. Pt is returned and items placed back on pt. Sitter now present at pt bedside for safety

## 2020-03-20 NOTE — ED Notes (Signed)
Pt had removed gown, pure wick and sheets from person, pt cleaned and items replaced for comfort for pt at this time. Lights dimmed for decreased stimuli in room

## 2020-03-20 NOTE — ED Notes (Signed)
Assisted pt to the toilet. Pt had a small formed bowel movement.

## 2020-03-21 LAB — URINE CULTURE

## 2020-03-21 LAB — RESPIRATORY PANEL BY RT PCR (FLU A&B, COVID)
Influenza A by PCR: NEGATIVE
Influenza B by PCR: NEGATIVE
SARS Coronavirus 2 by RT PCR: NEGATIVE

## 2020-03-21 MED ORDER — ACETAMINOPHEN 500 MG PO TABS
1000.0000 mg | ORAL_TABLET | Freq: Once | ORAL | Status: AC
  Administered 2020-03-21: 1000 mg via ORAL
  Filled 2020-03-21: qty 2

## 2020-03-21 NOTE — NC FL2 (Signed)
[name] LEVEL OF CARE SCREENING TOOL     IDENTIFICATION  Patient Name: Sheila Bowman Birthdate: October 03, 1951 Sex: female Admission Date (Current Location): 03/19/2020  [name] and [name] Number:  Engineering geologist and Address:  [facility], [address], [name], [name] [address]      Provider Number: [id number]  Attending Physician Name and Address:  No att. providers found  Relative Name and Phone Number:  [name]    Current Level of Care: Hospital Recommended Level of Care: Memory Care Prior Approval Number:    Date Approved/Denied:   PASRR Number:    Discharge Plan: Other (Comment)(Memory Care)    Current Diagnoses: Patient Active Problem List   Diagnosis Date Noted  . Goals of care, counseling/discussion   . DNR (do not resuscitate)   . Palliative care by specialist   . Failure to thrive in adult   . Acute metabolic encephalopathy 81/19/1478  . Alcoholic cirrhosis of liver without ascites ([place]) 02/18/2020  . Alcohol abuse with alcohol-induced disorder ([place]) 02/18/2020  . Hypokalemia 02/18/2020  . Type 2 diabetes mellitus without complication ([place]) 29/56/2130  . Essential hypertension 02/18/2020    Orientation RESPIRATION BLADDER Height & Weight     Self, Time  Normal Continent Weight: 61.3 kg Height:  5\' 5"  (165.1 cm)  BEHAVIORAL SYMPTOMS/MOOD NEUROLOGICAL BOWEL NUTRITION STATUS      Continent Diet(Regular)  AMBULATORY STATUS COMMUNICATION OF NEEDS Skin   Independent Verbally Normal                       Personal Care Assistance Level of Assistance  Bathing, Feeding, Dressing Bathing Assistance: Limited assistance Feeding assistance: Limited assistance Dressing Assistance: Limited assistance     Functional Limitations Info             SPECIAL CARE FACTORS FREQUENCY                       Contractures      Additional Factors Info                  Current  Medications (03/21/2020):  This is the current hospital active medication list Current Facility-Administered Medications  Medication Dose Route Frequency Provider Last Rate Last Admin  . feeding supplement (ENSURE ENLIVE) (ENSURE ENLIVE) liquid 237 mL  237 mL Oral BID BM Hinda Kehr, MD      . folic acid (FOLVITE) tablet 1 mg  1 mg Oral Daily Hinda Kehr, MD      . hydrochlorothiazide (HYDRODIURIL) tablet 12.5 mg  12.5 mg Oral Daily Hinda Kehr, MD      . iohexol (OMNIPAQUE) 9 MG/ML oral solution 500 mL  500 mL Oral BID PRN Drenda Freeze, MD      . irbesartan Levy Sjogren) tablet 300 mg  300 mg Oral Daily Hinda Kehr, MD      . lactulose [facility]) 10 GM/15ML solution 20 g  20 g Oral Daily Hinda Kehr, MD      . megestrol (MEGACE) 400 MG/10ML suspension 300 mg  300 mg Oral BID Hinda Kehr, MD      . multivitamin with minerals tablet 1 tablet  1 tablet Oral Daily Hinda Kehr, MD      . potassium chloride (KLOR-CON) CR tablet 10 mEq  10 mEq Oral Daily Hinda Kehr, MD      . QUEtiapine (SEROQUEL) tablet 25 mg  25 mg Oral Q1500 Hinda Kehr, MD  25 mg at 03/21/20 0058  . thiamine tablet 50 mg  50 mg Oral Daily Loleta Rose, MD       Current Outpatient Medications  Medication Sig Dispense Refill  . feeding supplement, ENSURE ENLIVE, (ENSURE ENLIVE) LIQD Take 237 mLs by mouth 2 (two) times daily between meals. 237 mL 12  . folic acid (FOLVITE) 1 MG tablet Take 1 tablet (1 mg total) by mouth daily. 30 tablet 0  . hydrochlorothiazide (HYDRODIURIL) 12.5 MG tablet Take 12.5 mg by mouth daily.    Marland Kitchen lactulose (CHRONULAC) 10 GM/15ML solution Take 30 mLs (20 g total) by mouth daily. 236 mL 0  . megestrol (MEGACE) 400 MG/10ML suspension Take 7.5 mLs (300 mg total) by mouth 2 (two) times daily. 240 mL 1  . Multiple Vitamin (MULTIVITAMIN WITH MINERALS) TABS tablet Take 1 tablet by mouth daily. 30 tablet 0  . olmesartan (BENICAR) 40 MG tablet Take 1 tablet (40 mg total) by mouth daily. 30  tablet 1  . potassium chloride (KLOR-CON) 10 MEQ tablet Take 10 mEq by mouth daily.    . QUEtiapine (SEROQUEL) 25 MG tablet Take 1 tablet (25 mg total) by mouth 3 (three) times daily. (Patient taking differently: Take 25 mg by mouth daily in the afternoon. ) 90 tablet 0  . thiamine 100 MG tablet Take 0.5 tablets (50 mg total) by mouth daily. 30 tablet 0     Discharge Medications: Please see discharge summary for a list of discharge medications.  Relevant Imaging Results:  Relevant Lab Results:   Additional Information SS#[id number] (Family prepared to Private Pay)  [place] Cellar, RN

## 2020-03-21 NOTE — TOC Progression Note (Signed)
Transition of Care [facility]) - Progression Note    Patient Details  Name: Sheila Bowman MRN: [id number] Date of Birth: [birth year]  Transition [facility]) CM/SW Contact  [place] Cellar, RN Phone Number: 03/21/2020, 10:29 AM  Clinical Narrative:    Sherron Monday with [name] @ [facility]-will fax over  Fl2 to [phone]   Expected Discharge Plan: Memory Care Barriers to Discharge: Other (comment)(placement into [name])  Expected Discharge Plan and Services Expected Discharge Plan: Memory Care       Living arrangements for the past 2 months: Single Family Home                                       Social Determinants of Health (SDOH) Interventions    Readmission Risk Interventions No flowsheet data found.

## 2020-03-21 NOTE — ED Notes (Signed)
Pt given tray at this time.

## 2020-03-21 NOTE — ED Notes (Signed)
VS obtained by this RN, pt repositioned in bed. Meds administered by this RN per MD order. Pt tolerated well. Pt sitting in bed eating breakfast at this time NAD noted at this time. Safety sitter remains with patient at this time.

## 2020-03-21 NOTE — ED Notes (Signed)
Bed alarm on and tele sitter in place.

## 2020-03-21 NOTE — TOC Progression Note (Signed)
Transition of Care [facility]) - Progression Note    Patient Details  Name: Minka Knight MRN: [id number] Date of Birth: 05-22-1951  Transition of Care [facility] CM/SW Contact  Marina Goodell Phone Number: [phone] 03/21/2020, 1:41 PM  Clinical Narrative:    Spoke with Reita Chard at [facility].  Mr. Tania Ade was inquiring about patient recent alcohol detox status.  This CSW stated there was only one mention of the detox and the patient was d/c to home, and has been at home since then.  This CSW let Mr. Weekly know there have been no behavioral concerns and patient has been cooperative with hospital staff.  Mr. Tania Ade stated he would speak with the administrator and get back to me.   Expected Discharge Plan: Memory Care Barriers to Discharge: Other (comment)(placement into [name])  Expected Discharge Plan and Services Expected Discharge Plan: Memory Care       Living arrangements for the past 2 months: Single Family Home                                       Social Determinants of Health (SDOH) Interventions    Readmission Risk Interventions No flowsheet data found.

## 2020-03-21 NOTE — TOC Initial Note (Signed)
Transition of Care [facility]) - Initial/Assessment Note    Patient Details  Name: Sheila Bowman MRN: [id number] Date of Birth: [birth year]  Transition of Care [facility]) CM/SW Contact:    [place] Cellar, RN Phone Number: 03/21/2020, 9:43 AM  Clinical Narrative:                 Spoke with [name], daughter, [phone], advised family is seeking memory care placement.Patient has been living with daughter and has become more than family can handle. Patient has monthly income of $8,000 and family is liquidating assets currently. Family is prepared to private pay. Patient is currently followed by palliative care. Daughter is requesting placement at [facility] if availability. RN CM will outreach to [employer].   Expected Discharge Plan: Memory Care Barriers to Discharge: Other (comment)(placement into [name])   Patient Goals and CMS Choice Patient states their goals for this hospitalization and ongoing recovery are:: Placement into memory care      Expected Discharge Plan and Services Expected Discharge Plan: Memory Care       Living arrangements for the past 2 months: Single Family Home                                      Prior Living Arrangements/Services Living arrangements for the past 2 months: Single Family Home Lives with:: Adult Children(Lives with daughter) Patient language and need for interpreter reviewed:: Yes Do you feel safe going back to the place where you live?: No   seeking placement into memory care facility  Need for Family Participation in Patient Care: Yes (Comment) Care giver support system in place?: Yes (comment)   Criminal Activity/Legal Involvement Pertinent to Current Situation/Hospitalization: No - Comment as needed  Activities of Daily Living      Permission Sought/Granted Permission sought to share information with : Case Manager, Magazine features editor Permission granted to share information with : Yes, Verbal Permission  Granted  Share Information with NAME: [name]           Emotional Assessment Appearance:: Appears older than stated age Attitude/Demeanor/Rapport: Unable to Assess Affect (typically observed): Unable to Assess Orientation: : Oriented to Self, Fluctuating Orientation (Suspected and/or reported Sundowners), Oriented to  Time Alcohol / Substance Use: Alcohol Use(Completed Detox in 02/2020-no alcohol use since) Psych Involvement: No (comment)  Admission diagnosis:  AMS Etoh Patient Active Problem List   Diagnosis Date Noted  . Goals of care, counseling/discussion   . DNR (do not resuscitate)   . Palliative care by specialist   . Failure to thrive in adult   . Acute metabolic encephalopathy 02/18/2020  . 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 Alcohol abuse with alcohol-induced disorder (H[facility]) 02/18/2020  . Hypokalemia 02/18/2020  . Type 2 diabetes mellitus without complication (HCC) 02/18/2020  . Essential hypertension 02/18/2020   PCP:  [facility]:   [facility] DELIVERY - [place], [place] - [address] [place] [address] Phone: [phone] Fax: [phone]  [facility] [address] - [place], [place] - [address] [place] [address] Phone: [phone] Fax: [phone]     Social Determinants of Health (SDOH) Interventions    Readmission Risk Interventions No flowsheet data found.

## 2020-03-21 NOTE — ED Provider Notes (Signed)
-----------------------------------------   6:51 AM on 03/21/2020 -----------------------------------------   Blood pressure (!) 143/69, pulse 81, temperature 98.2 F (36.8 C), temperature source Oral, resp. rate 18, height 1.651 m (5\' 5" ), weight 61.3 kg, SpO2 96 %.  The patient is calm and cooperative at this time.  There have been no acute events since the last update.  Awaiting disposition plan fromSocial Work team.   , MD 03/21/20 [phone]

## 2020-03-22 NOTE — ED Notes (Signed)
Pt calling out for help, when this RN entered the room pt had her legs on the side of the bed stating she needed to go to the bathroom. Pt ambulated to the toilet with stand by assist, gait steady. Pt advised to use the call when she needed assistance. Tele-sitter in room, bed alarm on

## 2020-03-22 NOTE — ED Notes (Signed)
Tele sitter in room.  Pt in bed. Bed alarm on. Pt disoriented to time and place.  TV turned on for pt. Informed about breakfast time.  Will attempt to call son for pt.  Given graham crackers.

## 2020-03-22 NOTE — ED Notes (Signed)
Pt given graham crackers and gingerale per request

## 2020-03-22 NOTE — ED Notes (Signed)
Pt up to bathroom with this RN.

## 2020-03-22 NOTE — TOC Progression Note (Signed)
Transition of Care [facility]) - Progression Note    Patient Details  Name: Sheila Bowman MRN: [id number] Date of Birth: Oct 13, 1951  Transition of Care [facility]) CM/SW Contact  [place] Cellar, RN Phone Number: 03/22/2020, 9:16 AM  Clinical Narrative:    Left VM for [name] requesting update on review for admission to [facility].    Expected Discharge Plan: Memory Care Barriers to Discharge: Other (comment)(placement into [name])  Expected Discharge Plan and Services Expected Discharge Plan: Memory Care       Living arrangements for the past 2 months: Single Family Home                                       Social Determinants of Health (SDOH) Interventions    Readmission Risk Interventions No flowsheet data found.

## 2020-03-22 NOTE — ED Notes (Addendum)
Pt had formed bowel movement and urinated in toilet. Pt washed hands and washed her bottom up with a bath wipe. Pt linens straightened up in her bed

## 2020-03-22 NOTE — ED Notes (Signed)
Have toothbrush for pt but waiting for toothpaste to come from supply.  Pt resting and waiting on breakfast.

## 2020-03-22 NOTE — ED Notes (Signed)
Upon entering room pt was sleeping, respirations were equal and unlabored no signs of acute distress. Pt was easily arousable to voice and was alert and oriented. Pt denies any further needs and this RN emphasized to pt to make sure that she uses her call bell if she needs anything.

## 2020-03-22 NOTE — TOC Progression Note (Signed)
Transition of Care [facility]) - Progression Note    Patient Details  Name: Gurleen Larrivee MRN: [id number] Date of Birth: 13-Mar-1951  Transition of Care [facility]) CM/SW Contact  [place] Cellar, RN Phone Number: 03/22/2020, 2:40 PM  Clinical Narrative:    Spoke with Ed @ [name] who mentioned concerns related to history of alcohol abuse and adjustment to facility setting. Ed agreed to come assess patient in person tomorrow @ 10am. RN CM will join assessment.    Expected Discharge Plan: Memory Care Barriers to Discharge: Other (comment)(placement into [name])  Expected Discharge Plan and Services Expected Discharge Plan: Memory Care       Living arrangements for the past 2 months: Single Family Home                                       Social Determinants of Health (SDOH) Interventions    Readmission Risk Interventions No flowsheet data found.

## 2020-03-22 NOTE — ED Notes (Signed)
Pt ate 100% of lunch tray. Pt calling out for help and had one leg out of bed and bed alarm was going off. Pt advised she needed to go to the bathroom. Pt assisted to toilet with one person assit.

## 2020-03-22 NOTE — ED Notes (Signed)
Pt given dinner tray and ginger ale. 

## 2020-03-22 NOTE — ED Notes (Signed)
Pt ate all of breakfast.  

## 2020-03-22 NOTE — ED Notes (Signed)
Pt assisted to toilet with one person assist 

## 2020-03-22 NOTE — ED Notes (Signed)
Pt given lunch tray and cranberry juice, tele sitter at bedside

## 2020-03-23 NOTE — ED Notes (Signed)
Pt resting in bed, respirations are equal and unlabored. No signs of acute distress. 

## 2020-03-23 NOTE — ED Notes (Signed)
This RN assisted pt with ambulation to bathroom, pt ambulated independently and with steady gait. 

## 2020-03-23 NOTE — ED Notes (Signed)
Pt resting with eyes closed.

## 2020-03-23 NOTE — ED Notes (Signed)
This RN assisted pt with ambulation to restroom, pt ambulated independently and with steady gait.

## 2020-03-23 NOTE — ED Notes (Signed)
Patient is resting comfortably. 

## 2020-03-23 NOTE — ED Notes (Signed)
Social worker at bedside.

## 2020-03-23 NOTE — ED Notes (Signed)
Pt sleeping  Tele sitter in place.

## 2020-03-23 NOTE — ED Notes (Signed)
Pt sleeping. 

## 2020-03-23 NOTE — ED Notes (Signed)
Pt given lunch

## 2020-03-23 NOTE — TOC Progression Note (Signed)
Transition of Care [facility]) - Progression Note    Patient Details  Name: Sheila Bowman MRN: [id number] Date of Birth: 06/18/1951  Transition of Care [facility]) CM/SW Contact  Anselm Pancoast, RN Phone Number: 03/23/2020, 10:51 AM  Clinical Narrative:    Met with Ed from [facility] @ patient bedside to discuss possible placement into memory care unit. Patient in agreeance. Ed is going to outreach to daughter to discuss concerns related to ETOH abuse history and will call this RN CM back with decision on ability to accept patient to facility.    Expected Discharge Plan: Memory Care Barriers to Discharge: Other (comment)(placement into [facility])  Expected Discharge Plan and Services Expected Discharge Plan: Memory Care       Living arrangements for the past 2 months: Single Family Home                                       Social Determinants of Health (SDOH) Interventions    Readmission Risk Interventions No flowsheet data found.

## 2020-03-23 NOTE — ED Notes (Addendum)
Pt up to bathroom, unassisted. This RN stayed in room to assure pt safety. Pt ate biscuit, half a banana and 8oz water.

## 2020-03-23 NOTE — TOC Progression Note (Signed)
Transition of Care [facility]) - Progression Note    Patient Details  Name: Sheila Bowman MRN: [id number] Date of Birth: 1951/12/08  Transition of Care [facility]) CM/SW Contact  [place] Cellar, RN Phone Number: 03/23/2020, 5:04 PM  Clinical Narrative:    Sherron Monday to [name] @ [name] who states she reviewed case with Ed and would like to come out and complete another assessment on this patient @ 11am tomorrow. RN CM will meet in lobby and assist with assessment.    Expected Discharge Plan: Memory Care Barriers to Discharge: Other (comment)(placement into [name])  Expected Discharge Plan and Services Expected Discharge Plan: Memory Care       Living arrangements for the past 2 months: Single Family Home                                       Social Determinants of Health (SDOH) Interventions    Readmission Risk Interventions No flowsheet data found.

## 2020-03-23 NOTE — ED Notes (Signed)
Pt up to bathroom without assistance and bed alarm going off.  Pt instructed again to call for a nurse before getting up. Pt assisted to toilet to urinate and back into bed. Telesitter remains in place; bed alarm active.

## 2020-03-23 NOTE — ED Provider Notes (Signed)
-----------------------------------------   6:08 AM on 03/23/2020 -----------------------------------------   Blood pressure 135/63, pulse 83, temperature 98.7 F (37.1 C), temperature source Oral, resp. rate 16, height 5\' 5"  (1.651 m), weight 61.3 kg, SpO2 98 %.  The patient is calm and cooperative at this time.  There have been no acute events since the last update.  Awaiting disposition plan from Social Work team(s).   , MD 03/23/20 [phone]

## 2020-03-23 NOTE — ED Notes (Signed)
Pt given breakfast.

## 2020-03-23 NOTE — ED Notes (Signed)
Pt up to bathroom without assistance and bed alarm going off.  Pt instrcuted again to call for a nurse before getting up.  telesitter in place.  Pt alert.

## 2020-03-24 ENCOUNTER — Emergency Department: Payer: Medicare Other

## 2020-03-24 MED ORDER — HALOPERIDOL LACTATE 5 MG/ML IJ SOLN
INTRAMUSCULAR | Status: AC
  Administered 2020-03-24: 5 mg via INTRAVENOUS
  Filled 2020-03-24: qty 1

## 2020-03-24 MED ORDER — HALOPERIDOL LACTATE 5 MG/ML IJ SOLN: 5.0000 mg | Freq: Once | INTRAMUSCULAR | Status: AC

## 2020-03-24 MED ORDER — QUETIAPINE FUMARATE 25 MG PO TABS
25.0000 mg | ORAL_TABLET | Freq: Three times a day (TID) | ORAL | Status: DC
  Administered 2020-03-24 – 2020-03-26 (×5): 25 mg via ORAL
  Filled 2020-03-24 (×5): qty 1

## 2020-03-24 NOTE — ED Notes (Signed)
Pt resting with eyes closed.

## 2020-03-24 NOTE — ED Notes (Signed)
Assisted to restroom.

## 2020-03-24 NOTE — ED Notes (Signed)
Pt ambulated to restroom. 

## 2020-03-24 NOTE — TOC Progression Note (Addendum)
Transition of Care [facility]) - Progression Note    Patient Details  Name: Sheila Bowman MRN: [id number] Date of Birth: 1951-06-10  Transition of Care [facility]) CM/SW Contact  [name], RN Phone Number: 03/24/2020, 12:39 PM  Clinical Narrative:    [name] visited patient and accepted patient into [facility] pending COVID test (3/22-last COVID), TB-chest xray, and standing orders (already signed by MD).    Expected Discharge Plan: Memory Care Barriers to Discharge: Other (comment)(placement into [name])  Expected Discharge Plan and Services Expected Discharge Plan: Memory Care       Living arrangements for the past 2 months: Single Family Home                                       Social Determinants of Health (SDOH) Interventions    Readmission Risk Interventions No flowsheet data found.

## 2020-03-24 NOTE — ED Notes (Signed)
Pt given meal tray.

## 2020-03-24 NOTE — ED Notes (Signed)
Patient is sleeping still at this time

## 2020-03-24 NOTE — ED Notes (Signed)
Assisted pt to the toilet. Stand by assist. Provided pt with a snack.

## 2020-03-24 NOTE — ED Notes (Signed)
Patient continues to get out of bed without using call bell. Telesitter at bedside, bed alarm on. Escorted patient to restroom. Provided warm blankets and emotional support, TV on

## 2020-03-24 NOTE — ED Provider Notes (Signed)
   Blood pressure 134/67, pulse 67, temperature 99.3 F (37.4 C), temperature source Oral, resp. rate 18, height 5\' 5"  (1.651 m), weight 61.3 kg, SpO2 96 %.   Patient is awaiting disposition from .  Hopefully patient will be going tomorrow.    Child psychotherapist, MD 03/24/20 [phone]

## 2020-03-24 NOTE — ED Notes (Signed)
Pt alert and watching tv and eating crackers.

## 2020-03-25 NOTE — ED Notes (Signed)
Pt transported to decon hallway for shower.

## 2020-03-25 NOTE — ED Notes (Signed)
Sitter at bedside, NAD 

## 2020-03-25 NOTE — TOC Progression Note (Signed)
Transition of Care [facility]) - Progression Note    Patient Details  Name: Sheila Bowman MRN: [id number] Date of Birth: July 26, 1951  Transition of Care [facility]) CM/SW Contact  [place] Cellar, RN Phone Number: 03/25/2020, 3:11 PM  Clinical Narrative:    Anticipate transfer to [name] over the weekend once COVID results completed.    Expected Discharge Plan: Memory Care Barriers to Discharge: Other (comment)(placement into [name])  Expected Discharge Plan and Services Expected Discharge Plan: Memory Care       Living arrangements for the past 2 months: Single Family Home                                       Social Determinants of Health (SDOH) Interventions    Readmission Risk Interventions No flowsheet data found.

## 2020-03-25 NOTE — ED Notes (Signed)
Working on bed at [employer] per Sports coach.

## 2020-03-25 NOTE — ED Provider Notes (Signed)
-----------------------------------------   6:10 AM on 03/25/2020 -----------------------------------------   Blood pressure 136/63, pulse 73, temperature 98.2 F (36.8 C), temperature source Oral, resp. rate 18, height 5\' 5"  (1.651 m), weight 61.3 kg, SpO2 96 %.  The patient is calm and cooperative at this time.  There have been no acute events since the last update.  Awaiting disposition plan from Social Work team(s).   , MD 03/25/20 [phone]

## 2020-03-25 NOTE — ED Notes (Signed)
Telesitter called and patient's bed alarm was going off, patient was getting up. Assisted patient to edge of bed- she says she can't sleep. This RN took patient on a walk around the unit, tolerated well. Charge RN got NT to sit and watch patient for now as she is confused and not following directions

## 2020-03-25 NOTE — TOC Progression Note (Signed)
Transition of Care [facility]) - Progression Note    Patient Details  Name: Sheila Bowman MRN: [id number] Date of Birth: 03/23/1951  Transition of Care [facility]) CM/SW Contact  [place] Cellar, RN Phone Number: 03/25/2020, 9:42 AM  Clinical Narrative:    Spoke with [name] @ [address] states they are working with family on financial part of placement and need an updated COVID test and the chest xray needs to state patient had no signs of Tuberculosis. ED RN updated.    Expected Discharge Plan: Memory Care Barriers to Discharge: Other (comment)(placement into [name])  Expected Discharge Plan and Services Expected Discharge Plan: Memory Care       Living arrangements for the past 2 months: Single Family Home                                       Social Determinants of Health (SDOH) Interventions    Readmission Risk Interventions No flowsheet data found.

## 2020-03-25 NOTE — ED Notes (Signed)
Patient is resting comfortably. Bed alarm on, telesitter at bedside, no complaints

## 2020-03-25 NOTE — ED Notes (Signed)
Pt currently sleeping at this time. Pt's food tray was delivered. Pt was woken up and made aware of this. Pt stated she was not hungry at this time. This nurse tech still sitting with pt.

## 2020-03-25 NOTE — ED Notes (Addendum)
Sitter at bedside. Pt sleeping.  Unlabored, NAD.

## 2020-03-25 NOTE — ED Notes (Signed)
Pt given dinner tray. This nt is sitting with pt, giving assistance when needed.

## 2020-03-25 NOTE — ED Notes (Signed)
This tech in room as 1:1 sitter. Pt sleeping at this time.

## 2020-03-26 ENCOUNTER — Encounter: Payer: Self-pay | Admitting: Internal Medicine

## 2020-03-26 DIAGNOSIS — Z20822 Contact with and (suspected) exposure to covid-19: Secondary | ICD-10-CM | POA: Diagnosis present

## 2020-03-26 DIAGNOSIS — R4182 Altered mental status, unspecified: Secondary | ICD-10-CM | POA: Diagnosis present

## 2020-03-26 DIAGNOSIS — E872 Acidosis: Secondary | ICD-10-CM | POA: Diagnosis not present

## 2020-03-26 DIAGNOSIS — E119 Type 2 diabetes mellitus without complications: Secondary | ICD-10-CM | POA: Diagnosis present

## 2020-03-26 DIAGNOSIS — R569 Unspecified convulsions: Secondary | ICD-10-CM | POA: Diagnosis not present

## 2020-03-26 DIAGNOSIS — F0390 Unspecified dementia without behavioral disturbance: Secondary | ICD-10-CM | POA: Diagnosis present

## 2020-03-26 DIAGNOSIS — K7460 Unspecified cirrhosis of liver: Secondary | ICD-10-CM | POA: Diagnosis present

## 2020-03-26 DIAGNOSIS — K7031 Alcoholic cirrhosis of liver with ascites: Secondary | ICD-10-CM | POA: Diagnosis present

## 2020-03-26 DIAGNOSIS — K7040 Alcoholic hepatic failure without coma: Secondary | ICD-10-CM | POA: Diagnosis present

## 2020-03-26 DIAGNOSIS — K7200 Acute and subacute hepatic failure without coma: Secondary | ICD-10-CM | POA: Diagnosis not present

## 2020-03-26 DIAGNOSIS — R778 Other specified abnormalities of plasma proteins: Secondary | ICD-10-CM

## 2020-03-26 DIAGNOSIS — R9389 Abnormal findings on diagnostic imaging of other specified body structures: Secondary | ICD-10-CM | POA: Diagnosis present

## 2020-03-26 DIAGNOSIS — Z87891 Personal history of nicotine dependence: Secondary | ICD-10-CM | POA: Diagnosis not present

## 2020-03-26 DIAGNOSIS — R627 Adult failure to thrive: Secondary | ICD-10-CM | POA: Diagnosis present

## 2020-03-26 DIAGNOSIS — Z66 Do not resuscitate: Secondary | ICD-10-CM | POA: Diagnosis present

## 2020-03-26 DIAGNOSIS — Z6821 Body mass index (BMI) 21.0-21.9, adult: Secondary | ICD-10-CM | POA: Diagnosis not present

## 2020-03-26 DIAGNOSIS — G9341 Metabolic encephalopathy: Secondary | ICD-10-CM | POA: Diagnosis present

## 2020-03-26 DIAGNOSIS — K7682 Hepatic encephalopathy: Secondary | ICD-10-CM | POA: Diagnosis present

## 2020-03-26 DIAGNOSIS — D649 Anemia, unspecified: Secondary | ICD-10-CM | POA: Diagnosis present

## 2020-03-26 DIAGNOSIS — F1096 Alcohol use, unspecified with alcohol-induced persisting amnestic disorder: Secondary | ICD-10-CM

## 2020-03-26 DIAGNOSIS — F1010 Alcohol abuse, uncomplicated: Secondary | ICD-10-CM

## 2020-03-26 DIAGNOSIS — F1026 Alcohol dependence with alcohol-induced persisting amnestic disorder: Secondary | ICD-10-CM | POA: Diagnosis present

## 2020-03-26 DIAGNOSIS — E876 Hypokalemia: Secondary | ICD-10-CM | POA: Diagnosis not present

## 2020-03-26 DIAGNOSIS — I10 Essential (primary) hypertension: Secondary | ICD-10-CM

## 2020-03-26 DIAGNOSIS — F10239 Alcohol dependence with withdrawal, unspecified: Secondary | ICD-10-CM | POA: Diagnosis present

## 2020-03-26 DIAGNOSIS — I119 Hypertensive heart disease without heart failure: Secondary | ICD-10-CM | POA: Diagnosis present

## 2020-03-26 DIAGNOSIS — D539 Nutritional anemia, unspecified: Secondary | ICD-10-CM | POA: Diagnosis present

## 2020-03-26 DIAGNOSIS — Z8249 Family history of ischemic heart disease and other diseases of the circulatory system: Secondary | ICD-10-CM | POA: Diagnosis not present

## 2020-03-26 DIAGNOSIS — Z7189 Other specified counseling: Secondary | ICD-10-CM | POA: Diagnosis not present

## 2020-03-26 DIAGNOSIS — Z515 Encounter for palliative care: Secondary | ICD-10-CM | POA: Diagnosis not present

## 2020-03-26 LAB — CBC
HCT: 30.5 % — ABNORMAL LOW (ref 36.0–46.0)
Hemoglobin: 9.8 g/dL — ABNORMAL LOW (ref 12.0–15.0)
MCH: 31.1 pg (ref 26.0–34.0)
MCHC: 32.1 g/dL (ref 30.0–36.0)
MCV: 96.8 fL (ref 80.0–100.0)
Platelets: 72 K/uL — ABNORMAL LOW (ref 150–400)
RBC: 3.15 MIL/uL — ABNORMAL LOW (ref 3.87–5.11)
RDW: 15.7 % — ABNORMAL HIGH (ref 11.5–15.5)
WBC: 6.3 K/uL (ref 4.0–10.5)
nRBC: 0 % (ref 0.0–0.2)

## 2020-03-26 LAB — AMMONIA: Ammonia: 144 umol/L — ABNORMAL HIGH (ref 9–35)

## 2020-03-26 LAB — COMPREHENSIVE METABOLIC PANEL
ALT: 19 U/L (ref 0–44)
AST: 32 U/L (ref 15–41)
Albumin: 3 g/dL — ABNORMAL LOW (ref 3.5–5.0)
Alkaline Phosphatase: 67 U/L (ref 38–126)
Anion gap: 7 (ref 5–15)
BUN: 15 mg/dL (ref 8–23)
CO2: 22 mmol/L (ref 22–32)
Calcium: 9.1 mg/dL (ref 8.9–10.3)
Chloride: 112 mmol/L — ABNORMAL HIGH (ref 98–111)
Creatinine, Ser: 0.65 mg/dL (ref 0.44–1.00)
GFR calc Af Amer: >60 mL/min (ref >60)
GFR calc non Af Amer: >60 mL/min (ref >60)
Glucose, Bld: 142 mg/dL — ABNORMAL HIGH (ref 70–99)
Potassium: 4.3 mmol/L (ref 3.5–5.1)
Sodium: 141 mmol/L (ref 135–145)
Total Bilirubin: 2.8 mg/dL — ABNORMAL HIGH (ref 0.3–1.2)
Total Protein: 6.2 g/dL — ABNORMAL LOW (ref 6.5–8.1)

## 2020-03-26 LAB — URINALYSIS, COMPLETE (UACMP) WITH MICROSCOPIC
Bacteria, UA: NONE SEEN
Bilirubin Urine: NEGATIVE
Glucose, UA: NEGATIVE mg/dL
Hgb urine dipstick: NEGATIVE
Ketones, ur: NEGATIVE mg/dL
Nitrite: NEGATIVE
Protein, ur: NEGATIVE mg/dL
Specific Gravity, Urine: 1.014 (ref 1.005–1.030)
pH: 7 (ref 5.0–8.0)

## 2020-03-26 LAB — SARS CORONAVIRUS 2 (TAT 6-24 HRS): SARS Coronavirus 2: NEGATIVE

## 2020-03-26 LAB — GLUCOSE, CAPILLARY: Glucose-Capillary: 119 mg/dL — ABNORMAL HIGH (ref 70–99)

## 2020-03-26 LAB — TROPONIN I (HIGH SENSITIVITY): Troponin I (High Sensitivity): 18 ng/L — ABNORMAL HIGH (ref <18)

## 2020-03-26 MED ORDER — SODIUM CHLORIDE 0.9 % IV SOLN
1.0000 g | INTRAVENOUS | Status: DC
  Administered 2020-03-26: 1 g via INTRAVENOUS
  Filled 2020-03-26 (×2): qty 10

## 2020-03-26 MED ORDER — LACTULOSE 10 GM/15ML PO SOLN
30.0000 g | Freq: Three times a day (TID) | ORAL | Status: DC
  Administered 2020-03-26: 30 g via ORAL
  Filled 2020-03-26 (×3): qty 60

## 2020-03-26 MED ORDER — LORAZEPAM 2 MG/ML IJ SOLN
INTRAMUSCULAR | Status: AC
  Administered 2020-03-26: 2 mg via INTRAVENOUS
  Filled 2020-03-26: qty 1

## 2020-03-26 MED ORDER — LORAZEPAM 1 MG PO TABS: 1.0000 mg | ORAL_TABLET | ORAL | Status: DC | PRN

## 2020-03-26 MED ORDER — FOLIC ACID 5 MG/ML IJ SOLN
1.0000 mg | Freq: Every day | INTRAMUSCULAR | Status: DC
  Administered 2020-03-27 – 2020-04-01 (×6): 1 mg via INTRAVENOUS
  Filled 2020-03-26 (×9): qty 0.2

## 2020-03-26 MED ORDER — HYDRALAZINE HCL 20 MG/ML IJ SOLN
5.0000 mg | INTRAMUSCULAR | Status: DC | PRN
  Administered 2020-03-27 (×2): 5 mg via INTRAVENOUS
  Filled 2020-03-26: qty 1

## 2020-03-26 MED ORDER — THIAMINE HCL 100 MG/ML IJ SOLN
100.0000 mg | Freq: Once | INTRAMUSCULAR | Status: DC
  Filled 2020-03-26: qty 2

## 2020-03-26 MED ORDER — ONDANSETRON HCL 4 MG/2ML IJ SOLN: 4.0000 mg | Freq: Three times a day (TID) | INTRAMUSCULAR | Status: DC | PRN

## 2020-03-26 MED ORDER — LORAZEPAM 2 MG/ML IJ SOLN
1.0000 mg | INTRAMUSCULAR | Status: DC | PRN
  Administered 2020-03-27: 2 mg via INTRAVENOUS
  Administered 2020-03-27: 1 mg via INTRAVENOUS
  Administered 2020-03-27 (×3): 2 mg via INTRAVENOUS
  Administered 2020-03-28: 1 mg via INTRAVENOUS
  Administered 2020-03-28 (×2): 2 mg via INTRAVENOUS
  Administered 2020-03-28: 1 mg via INTRAVENOUS
  Administered 2020-03-28: 2 mg via INTRAVENOUS
  Filled 2020-03-26 (×10): qty 1

## 2020-03-26 MED ORDER — THIAMINE HCL 100 MG/ML IJ SOLN
500.0000 mg | INTRAVENOUS | Status: AC
  Administered 2020-03-26 – 2020-03-28 (×3): 500 mg via INTRAVENOUS
  Filled 2020-03-26: qty 2
  Filled 2020-03-26 (×2): qty 5

## 2020-03-26 MED ORDER — LACTULOSE ENEMA: 300.0000 mL | Freq: Two times a day (BID) | ORAL | Status: DC

## 2020-03-26 MED ORDER — SODIUM CHLORIDE 0.9 % IV SOLN: INTRAVENOUS | Status: DC

## 2020-03-26 NOTE — ED Notes (Signed)
Pt brief changed by this rn, Kayla edt, and Brandy rn.

## 2020-03-26 NOTE — ED Notes (Signed)
Pt helped back into bed with Gerilyn Pilgrim, EDT who is sitting with pt. Pt in NAD. Covered back up.

## 2020-03-26 NOTE — ED Notes (Signed)
Pt brief changed and pt repositioned in bed by this rn and Kayla EDT.

## 2020-03-26 NOTE — ED Notes (Signed)
Pt able to swallow her pills in applesauce. Pt still not responding or verbally answering but will withdraw from pain. Pt gazing off without focusing.

## 2020-03-26 NOTE — ED Notes (Signed)
Upon trying to give meds, pt sleepy and not waking up enough to take oral meds. When attempting to get pt to swallow meds, pt bites straw instead of sipping. Pt not talking. Normally, pt talking and walking around room. Dr. Cyril Loosen notified.

## 2020-03-26 NOTE — Progress Notes (Signed)
Patient was placed on CIWA secondary to withdrawal symptoms while in ED.  Noted she has been without alcohol for week but last intake nor known and impaired/delayed hepatic metabolism likely to prolong alcohol withrawal risk zone

## 2020-03-26 NOTE — ED Notes (Addendum)
This RN at bedside due to patient's increased agitation at this time.  Pt making multiple attempts to get out of bed and is unable to be redirected at this time. Pt swinging at and being verbally aggressive to staff.

## 2020-03-26 NOTE — Progress Notes (Addendum)
Brief entry Admission order changed to telemetry with 1:1 observation to facilitate ED flow through as ICU refused admit to stepdown bed based on clinical admission criteria

## 2020-03-26 NOTE — H&P (Signed)
History and Physical    [name] JIR:[id number] DOB: Aug 21, 1951 DOA: 03/19/2020  Referring MD/NP/PA:   [facility]P: [facility] [place]   Patient coming from:  The patient is coming from home.  At baseline, pt is independent for most of ADL.        Chief Complaint: AMS  HPI: Sheila Bowman is a 69 y.o. 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  Patient patient has been obsereved in emergency room since 3/20 due to altered mental status and hallucination, suspecting Wernicke's Korsakoff syndrom. Pt was given folic acid and thiamin in ED. Per ED physician, patient mental status has improved in the past several days, but becomes worse again today. She had negative CT-head on 3/20.  Patient had nausea, vomiting and diarrhea, which has all resolved.  Did not have chest pain, shortness breath, cough.  No fever or chills.  She moves all extremities. When I saw pt in ED, pt is altered, barely arousable.  No facial droop.  ED Course: pt was found to have trop 23 --> 28 --> 18, negative Covid PCR 3/22, ammonia level 144 today, urinalysis (clear appearance, small amount of leukocyte, no bacteria, WBC 21-50), electrolytes renal function okay, WBC 6.3, temperature normal, blood pressure 139/77, heart rate 74, RR 20, oxygen sat 95% on room air.  Negative chest x-ray on 03/24/2020.  Patient is admitted to stepdown as inpatient.  # CT abdomen/pelvis on 03/20/2020: 1. Nodular hepatic surface contour, such finding may be seen in the setting of intrinsic liver disease/cirrhosis. 2. Diffuse edematous changes of the stomach, colon and small bowel. While these could feasibly be related to an enterocolitis, finding would be more nonspecific in the setting intrinsic liver as portal Sheila Bowman could have a similar appearance. 3. Small volume ascites  in the right subdiaphragmatic space. Could be related to possible liver disease or anasarca given circumferential body wall edema and mesenteric edema. 4. Questionable endometrial thickening measuring up to 11 mm. Recommend nonemergent, outpatient pelvic ultrasound for further evaluation. 5. Extensive coronary artery calcifications. 6. Small fat containing left inguinal hernia. 7. Aortic Atherosclerosis (ICD10-I70.0).   Review of Systems: Could not be reviewed due to altered mental status  Travel history: No recent long distant travel.  Allergy: No Known Allergies  Past Medical History:  Diagnosis Date  . Diabetes mellitus without complication ([place])   . Hypertension     History reviewed. No pertinent surgical history.  Social History:  reports that she has quit smoking. She has never used smokeless tobacco. She reports current alcohol use. She reports that she does not use drugs.  Family History:  Family History  Problem Relation Age of Onset  . Heart attack Father      Prior to Admission medications   Medication Sig Start Date End Date Taking? Authorizing Provider  feeding supplement, ENSURE ENLIVE, (ENSURE ENLIVE) LIQD Take 237 mLs by mouth 2 (two) times daily between meals. 02/25/20  Yes Fritzi Mandes, MD  folic acid (FOLVITE) 1 MG tablet Take 1 tablet (1 mg total) by mouth daily. 02/26/20  Yes Fritzi Mandes, MD  hydrochlorothiazide (HYDRODIURIL) 12.5 MG tablet Take 12.5 mg by mouth daily. 05/27/19 05/26/20 Yes [provider]  lactulose (CHRONULAC) 10 GM/15ML solution Take 30 mLs (20 g total) by mouth daily. 03/11/20  Yes Cristofano, Dorene Ar, MD  megestrol (MEGACE) 400 MG/10ML suspension Take 7.5 mLs (300 mg total) by mouth 2 (two)  times daily. 02/25/20  Yes Enedina Finner, MD  Multiple Vitamin (MULTIVITAMIN WITH MINERALS) TABS tablet Take 1 tablet by mouth daily. 02/26/20  Yes Enedina Finner, MD  olmesartan (BENICAR) 40 MG tablet Take 1 tablet (40 mg total) by mouth daily. 02/25/20  02/24/21 Yes Enedina Finner, MD  potassium chloride (KLOR-CON) 10 MEQ tablet Take 10 mEq by mouth daily. 10/13/19  Yes [provider]  QUEtiapine (SEROQUEL) 25 MG tablet Take 1 tablet (25 mg total) by mouth 3 (three) times daily. Patient taking differently: Take 25 mg by mouth daily in the afternoon.  03/11/20  Yes Cristofano, Worthy Rancher, MD  thiamine 100 MG tablet Take 0.5 tablets (50 mg total) by mouth daily. 02/26/20  Yes Enedina Finner, MD    Physical Exam: Vitals:   03/26/20 1415 03/26/20 1430 03/26/20 1445 03/26/20 1500  BP:  (!) 156/93  (!) 174/99  Pulse: 88 83 75 96  Resp:  16  18  Temp:      TempSrc:      SpO2: 96% 96% 95% 96%  Weight:      Height:       General: Not in acute distress HEENT:       Eyes: PERRL, EOMI, no scleral icterus.       ENT: No discharge from the ears and nose, no pharynx injection, no tonsillar enlargement.        Neck: No JVD, no bruit, no mass felt. Heme: No neck lymph node enlargement. Cardiac: S1/S2, RRR, No murmurs, No gallops or rubs. Respiratory:  No rales, wheezing, rhonchi or rubs. GI: Soft, nondistended, nontender, no organomegaly, BS present. GU: No hematuria Ext: No pitting leg edema bilaterally. 2+DP/PT pulse bilaterally. Musculoskeletal: No joint deformities, No joint redness or warmth, no limitation of ROM in spin. Skin: No rashes.  Neuro: confused, not oriented X3, cranial nerves II-XII grossly intact, moves all extremities.   Psych: Patient is not psychotic, no suicidal or hemocidal ideation.  Labs on Admission: I have personally reviewed following labs and imaging studies  CBC: Recent Labs  Lab 03/19/20 2218 03/19/20 2342 03/26/20 1139  WBC 3.8* 5.9 6.3  NEUTROABS 2.0  --   --   HGB 6.6* 9.5* 9.8*  HCT 21.7* 30.0* 30.5*  MCV 102.8* 99.0 96.8  PLT 49* 71* 72*   Basic Metabolic Panel: Recent Labs  Lab 03/19/20 2342 03/26/20 1139  NA 145 141  K 3.5 4.3  CL 119* 112*  CO2 22 22  GLUCOSE 102* 142*  BUN 8 15    CREATININE 0.55 0.65  CALCIUM 8.6* 9.1   GFR: Estimated Creatinine Clearance: 60.6 mL/min (by C-G formula based on SCr of 0.65 mg/dL). Liver Function Tests: Recent Labs  Lab 03/19/20 2342 03/26/20 1139  AST 44* 32  ALT 23 19  ALKPHOS 70 67  BILITOT 5.1* 2.8*  PROT 6.2* 6.2*  ALBUMIN 3.1* 3.0*   No results for input(s): LIPASE, AMYLASE in the last 168 hours. Recent Labs  Lab 03/19/20 2218 03/26/20 1203  AMMONIA 14 144*   Coagulation Profile: No results for input(s): INR, PROTIME in the last 168 hours. Cardiac Enzymes: Recent Labs  Lab 03/19/20 2342  CKTOTAL 344*   BNP (last 3 results) No results for input(s): PROBNP in the last 8760 hours. HbA1C: No results for input(s): HGBA1C in the last 72 hours. CBG: Recent Labs  Lab 03/26/20 1115  GLUCAP 119*   Lipid Profile: No results for input(s): CHOL, HDL, LDLCALC, TRIG, CHOLHDL, LDLDIRECT in the last 72 hours.  Thyroid Function Tests: No results for input(s): TSH, T4TOTAL, FREET4, T3FREE, THYROIDAB in the last 72 hours. Anemia Panel: No results for input(s): VITAMINB12, FOLATE, FERRITIN, TIBC, IRON, RETICCTPCT in the last 72 hours. Urine analysis:    Component Value Date/Time   COLORURINE YELLOW (A) 03/26/2020 1139   APPEARANCEUR CLEAR (A) 03/26/2020 1139   LABSPEC 1.014 03/26/2020 1139   PHURINE 7.0 03/26/2020 1139   GLUCOSEU NEGATIVE 03/26/2020 1139   HGBUR NEGATIVE 03/26/2020 1139   BILIRUBINUR NEGATIVE 03/26/2020 1139   KETONESUR NEGATIVE 03/26/2020 1139   PROTEINUR NEGATIVE 03/26/2020 1139   NITRITE NEGATIVE 03/26/2020 1139   LEUKOCYTESUR SMALL (A) 03/26/2020 1139   Sepsis Labs: @LABRCNTIP (procalcitonin:4,lacticidven:4) ) Recent Results (from the past 240 hour(s))  Urine Culture     Status: Abnormal   Collection Time: 03/20/20 12:00 AM   Specimen: Urine, Random  Result Value Ref [place] Status   Specimen Description   Final    URINE, RANDOM Performed at [facility], [address] [place], [place] [address]    Special Requests   Final    NONE Performed at [facility], [address] [place], [place] [address]    Culture MULTIPLE SPECIES PRESENT, SUGGEST RECOLLECTION (A)  Final   Report Status 03/21/2020 FINAL  Final  Respiratory Panel by RT PCR (Flu A&B, Covid) - Nasopharyngeal Swab     Status: None   Collection Time: 03/21/20 10:37 AM   Specimen: Nasopharyngeal Swab  Result Value Ref [place] Status   SARS Coronavirus 2 by RT PCR NEGATIVE NEGATIVE Final    Comment: (NOTE) SARS-CoV-2 target nucleic acids are NOT DETECTED. The SARS-CoV-2 RNA is generally detectable in upper respiratoy specimens during the acute phase of infection. The lowest concentration of SARS-CoV-2 viral copies this assay can detect is 131 copies/mL. A negative result does not preclude SARS-Cov-2 infection and should not be used as the sole basis for treatment or other patient management decisions. A negative result may occur with  improper specimen collection/handling, submission of specimen other than nasopharyngeal swab, presence of viral mutation(s) within the areas targeted by this assay, and inadequate number of viral copies (<131 copies/mL). A negative result must be combined with clinical observations, patient history, and epidemiological information. The expected result is Negative. Fact Sheet for Patients:  https://www.moore.com/https://www.fda.gov/media/142436/download Fact Sheet for Healthcare Providers:  https://www.young.biz/https://www.fda.gov/media/142435/download This test is not yet ap proved or cleared by the [place] FDA and  has been authorized for detection and/or diagnosis of SARS-CoV-2 by FDA under an Emergency Use Authorization (EUA). This EUA will remain  in effect (meaning this test can be used) for the duration of the COVID-19 declaration under Section 564(b)(1) of the Act, 21 U.S.C. section 360bbb-3(b)(1), unless the authorization is terminated or revoked sooner.    Influenza A by PCR  NEGATIVE NEGATIVE Final   Influenza B by PCR NEGATIVE NEGATIVE Final    Comment: (NOTE) The Xpert Xpress SARS-CoV-2/FLU/RSV assay is intended as an aid in  the diagnosis of influenza from Nasopharyngeal swab specimens and  should not be used as a sole basis for treatment. Nasal washings and  aspirates are unacceptable for Xpert Xpress SARS-CoV-2/FLU/RSV  testing. Fact Sheet for Patients: https://www.moore.com/https://www.fda.gov/media/142436/download Fact Sheet for Healthcare Providers: https://www.young.biz/https://www.fda.gov/media/142435/download This test is not yet approved or cleared by the [place] FDA and  has been authorized for detection and/or diagnosis of SARS-CoV-2 by  FDA under an Emergency Use Authorization (EUA). This EUA will remain  in effect (meaning this test can be used) for  the duration of the  Covid-19 declaration under Section 564(b)(1) of the Act, 21  U.S.C. section 360bbb-3(b)(1), unless the authorization is  terminated or revoked. Performed at [facility], [address], [place], [place] [address]      Radiological Exams on Admission: DG Chest 2 View  Addendum Date: 03/25/2020   ADDENDUM REPORT: 03/25/2020 10:15 ADDENDUM: No evidence to suggest active TB infection. No changes to the Findings section of the report. Electronically Signed   By: Duanne Guess D.O.   On: 03/25/2020 10:15   Result Date: 03/25/2020 CLINICAL DATA:  Smoking history, hypertension EXAM: CHEST - 2 VIEW COMPARISON:  03/19/2020 FINDINGS: The heart size and mediastinal contours are stable. Atherosclerotic calcification of the aortic knob. No focal airspace consolidation, pleural effusion, or pneumothorax. The visualized skeletal structures are unremarkable. IMPRESSION: No active cardiopulmonary disease. Electronically Signed: By: Duanne Guess D.O. On: 03/24/2020 16:39     EKG: Will get one.   Assessment/Plan Principal Problem:   Acute metabolic encephalopathy Active Problems:   Type 2 diabetes mellitus  without complication (HCC)   Essential hypertension   Normocytic anemia   Elevated troponin   Alcohol abuse   Cirrhosis (HCC)   Acute hepatic encephalopathy   Wernicke-Korsakoff syndrome (alcoholic) (HCC)   Acute metabolic encephalopathy: Likely due to hepatic encephalopathy given elevated ammonia level 144.  Patient may also have Warnicke Korsakoff syndrome.  Patient had negative CT head on 03/19/2020.  Neurology, Dr. Loretha Brasil is consulted.  He recommended to start patient with high dose of thiamine 500 mg for 3 days  -Admit to stepdown as inpatient -Lactulose 30 g 3 times daily -Frequent neuro check -Thiamine 500 mg daily for 3 days -hold home oral medications until mental status improved -Keep patient n.p.o. -IV fluid: 75 cc/h normal saline -start Rocephin IV empirically due to hepatic encephalopathy. -Follow-up of blood culture  Type 2 diabetes mellitus without complication (HCC): Most recent A1c 4.7, well controled. Patient is not taking meds at home.  Blood sugar 142 -check CBG qAM  Essential hypertension: -IV hydralazine -Hold oral medications  Normocytic anemia: Hemoglobin 9.8, 9.2 on 03/10/2020.  Patient had hemoglobin measurement 6.6 ounces/20/21 which was a mistake per ED physician. -Follow-up of a CBC  Elevated troponin: 23 -->28 -->18. No CP.  -no further work-up  Alcohol abuse: Patient has been in emergency room for 1 week, out of window for withdrawal -Need counseling when mental status improves  Cirrhosis [facility]): Liver function normal.  Elevated ammonia level 144, now has hepatic encephalopathy -on lactulose  Questionable endometrial thickening measuring up to 11 mm: Incidental finding by the CT scan. -may need f/u with OB/Gyn    Inpatient status:  # Patient requires inpatient status due to high intensity of service, high risk for further deterioration and high frequency of surveillance required.  I certify that at the point of admission it is my clinical  judgment that the patient will require inpatient hospital care spanning beyond 2 midnights from the point of admission.  . This patient has multiple chronic comorbidities including hypertension, diet-controlled diabetes, dementia, alcohol abuse, possible Wernicke's Korsakoff syndrome .  Marland Kitchen Now patient has presenting with AMS due to hepatic encephalopathy and possible Wernicke's Korsakoff syndrome . The worrisome physical exam findings include AMS . The initial radiographic and laboratory data are worrisome because of elevated ammonia level 144, elevated troponin, thrombocytopenia . Current medical needs: please see my assessment and plan . Predictability of an adverse outcome (risk): Patient has multiple comorbidities as listed  above. Now presents with hypertension, diet-controlled diabetes, dementia, alcohol abuse, possible Wernicke's Korsakoff syndrome. Patient's presentation is highly complicated.  Patient is at high risk of deteriorating.  Will need to be treated in hospital for at least 2 days.          DVT ppx: SCD Code Status: DNR per her daughter Family Communication:  Yes, patient's daughter by phone Disposition Plan:  Anticipate discharge back to previous home environment Consults called:  Dr. Loretha Brasil of neurology Admission status:  SDU/inpation       Date of Service 03/26/2020    Lorretta Harp [facility]   If 7PM-7AM, please contact night-coverage [email or web address] 03/26/2020, 4:12 PM

## 2020-03-27 ENCOUNTER — Inpatient Hospital Stay: Payer: Medicare Other

## 2020-03-27 DIAGNOSIS — G9341 Metabolic encephalopathy: Secondary | ICD-10-CM

## 2020-03-27 LAB — CBC
HCT: 32.1 % — ABNORMAL LOW (ref 36.0–46.0)
HCT: 32.9 % — ABNORMAL LOW (ref 36.0–46.0)
Hemoglobin: 10.4 g/dL — ABNORMAL LOW (ref 12.0–15.0)
Hemoglobin: 10.4 g/dL — ABNORMAL LOW (ref 12.0–15.0)
MCH: 31.5 pg (ref 26.0–34.0)
MCH: 31 pg (ref 26.0–34.0)
MCHC: 32.4 g/dL (ref 30.0–36.0)
MCHC: 31.6 g/dL (ref 30.0–36.0)
MCV: 97.3 fL (ref 80.0–100.0)
MCV: 97.9 fL (ref 80.0–100.0)
Platelets: 89 10*3/uL — ABNORMAL LOW (ref 150–400)
Platelets: 90 10*3/uL — ABNORMAL LOW (ref 150–400)
RBC: 3.3 MIL/uL — ABNORMAL LOW (ref 3.87–5.11)
RBC: 3.36 MIL/uL — ABNORMAL LOW (ref 3.87–5.11)
RDW: 15.5 % (ref 11.5–15.5)
RDW: 15.8 % — ABNORMAL HIGH (ref 11.5–15.5)
WBC: 7.2 10*3/uL (ref 4.0–10.5)
WBC: 9.6 10*3/uL (ref 4.0–10.5)
nRBC: 0 % (ref 0.0–0.2)
nRBC: 0 % (ref 0.0–0.2)

## 2020-03-27 LAB — COMPREHENSIVE METABOLIC PANEL
ALT: 24 U/L (ref 0–44)
ALT: 24 U/L (ref 0–44)
AST: 40 U/L (ref 15–41)
AST: 48 U/L — ABNORMAL HIGH (ref 15–41)
Albumin: 3.4 g/dL — ABNORMAL LOW (ref 3.5–5.0)
Albumin: 3.2 g/dL — ABNORMAL LOW (ref 3.5–5.0)
Alkaline Phosphatase: 68 U/L (ref 38–126)
Alkaline Phosphatase: 71 U/L (ref 38–126)
Anion gap: 6 (ref 5–15)
Anion gap: 13 (ref 5–15)
BUN: 14 mg/dL (ref 8–23)
BUN: 17 mg/dL (ref 8–23)
CO2: 20 mmol/L — ABNORMAL LOW (ref 22–32)
CO2: 17 mmol/L — ABNORMAL LOW (ref 22–32)
Calcium: 9 mg/dL (ref 8.9–10.3)
Calcium: 9 mg/dL (ref 8.9–10.3)
Chloride: 116 mmol/L — ABNORMAL HIGH (ref 98–111)
Chloride: 110 mmol/L (ref 98–111)
Creatinine, Ser: 0.63 mg/dL (ref 0.44–1.00)
Creatinine, Ser: 0.57 mg/dL (ref 0.44–1.00)
GFR calc Af Amer: >60 mL/min (ref >60)
GFR calc Af Amer: >60 mL/min (ref >60)
GFR calc non Af Amer: >60 mL/min (ref >60)
GFR calc non Af Amer: >60 mL/min (ref >60)
Glucose, Bld: 117 mg/dL — ABNORMAL HIGH (ref 70–99)
Glucose, Bld: 151 mg/dL — ABNORMAL HIGH (ref 70–99)
Potassium: 3.9 mmol/L (ref 3.5–5.1)
Potassium: 3.5 mmol/L (ref 3.5–5.1)
Sodium: 142 mmol/L (ref 135–145)
Sodium: 140 mmol/L (ref 135–145)
Total Bilirubin: 5 mg/dL — ABNORMAL HIGH (ref 0.3–1.2)
Total Bilirubin: 6.5 mg/dL — ABNORMAL HIGH (ref 0.3–1.2)
Total Protein: 6.6 g/dL (ref 6.5–8.1)
Total Protein: 6.6 g/dL (ref 6.5–8.1)

## 2020-03-27 LAB — BLOOD GAS, ARTERIAL
Acid-base deficit: 4.6 mmol/L — ABNORMAL HIGH (ref 0.0–2.0)
Bicarbonate: 16.4 mmol/L — ABNORMAL LOW (ref 20.0–28.0)
FIO2: 0.21
O2 Saturation: 98.6 %
Patient temperature: 37
pCO2 arterial: 21 mmHg — ABNORMAL LOW (ref 32.0–48.0)
pH, Arterial: 7.5 — ABNORMAL HIGH (ref 7.350–7.450)
pO2, Arterial: 107 mmHg (ref 83.0–108.0)

## 2020-03-27 LAB — URINE CULTURE: Culture: NO GROWTH

## 2020-03-27 LAB — GLUCOSE, CAPILLARY
Glucose-Capillary: 101 mg/dL — ABNORMAL HIGH (ref 70–99)
Glucose-Capillary: 112 mg/dL — ABNORMAL HIGH (ref 70–99)
Glucose-Capillary: 136 mg/dL — ABNORMAL HIGH (ref 70–99)

## 2020-03-27 LAB — AMMONIA: Ammonia: 34 umol/L (ref 9–35)

## 2020-03-27 LAB — MAGNESIUM: Magnesium: 1.7 mg/dL (ref 1.7–2.4)

## 2020-03-27 MED ORDER — CHLORHEXIDINE GLUCONATE CLOTH 2 % EX PADS
6.0000 | MEDICATED_PAD | Freq: Every day | CUTANEOUS | Status: DC
  Administered 2020-03-28 – 2020-04-01 (×5): 6 via TOPICAL

## 2020-03-27 MED ORDER — HYDRALAZINE HCL 20 MG/ML IJ SOLN
10.0000 mg | Freq: Four times a day (QID) | INTRAMUSCULAR | Status: DC | PRN
  Administered 2020-03-27: 10 mg via INTRAVENOUS
  Filled 2020-03-27: qty 1

## 2020-03-27 MED ORDER — LACTULOSE ENEMA
300.0000 mL | Freq: Once | ORAL | Status: AC
  Administered 2020-03-27: 300 mL via RECTAL
  Filled 2020-03-27: qty 300

## 2020-03-27 MED ORDER — SODIUM BICARBONATE 8.4 % IV SOLN
100.0000 meq | Freq: Once | INTRAVENOUS | Status: AC
  Administered 2020-03-28: 100 meq via INTRAVENOUS
  Filled 2020-03-27: qty 50

## 2020-03-27 MED ORDER — SODIUM BICARBONATE-DEXTROSE 150-5 MEQ/L-% IV SOLN
150.0000 meq | INTRAVENOUS | Status: DC
  Administered 2020-03-28: 150 meq via INTRAVENOUS
  Filled 2020-03-27 (×3): qty 1000

## 2020-03-27 NOTE — ED Notes (Signed)
Pt soiled diaper, new diaper, linens and gown changed.  Pt cleaned with Premoistened wipes.  Tolerated well.  No acute changes in care.  Sitter remains at bedside.  Will continue to monitor

## 2020-03-27 NOTE — Progress Notes (Signed)
   Vital Signs MEWS/VS Documentation      03/27/2020 1148 03/27/2020 1307 03/27/2020 1558 03/27/2020 1809   MEWS Score:  2  2  3  4    MEWS Score Color:  Yellow  Yellow  Yellow  Red   Resp:  20  --  --  20   Pulse:  (!) 101  --  (!) 111  (!) 110   BP:  (!) 171/79  (!) 182/75  (!) 184/123  (!) 207/85   Temp:  98.7 F (37.1 C)  --  98.3 F (36.8 C)  98.7 F (37.1 C)   O2 Device:  Room Air  --  Room Air  Room Air   Level of Consciousness:  --  --  --  Responds to Voice      Patient back and forth from red to yellow MEWS today. Dr. 07[birth year]-1982 aware. Increased hydralazine to 10mg  PRN. Ativan given throughout shift.     Arora Coakley 03/27/2020,6:38 PM

## 2020-03-27 NOTE — TOC Progression Note (Signed)
Transition of Care [facility]) - Progression Note    Patient Details  Name: Sheila Bowman MRN: [id number] Date of Birth: [birth year]  [facility]) CM/SW Contact  Maree Krabbe, LCSW Phone Number: 03/27/2020, 11:37 AM  Clinical Narrative:  COVID results are back, however, CSW called [name] and was told that the Director, Mick Sell was not in until tomorrow and they do not do weekend transfers. Pt would be a new admit to them. MD notified.      Expected Discharge Plan: Memory Care Barriers to Discharge: Other (comment)(placement into [name])  Expected Discharge Plan and Services Expected Discharge Plan: Memory Care       Living arrangements for the past 2 months: Single Family Home                                       Social Determinants of Health (SDOH) Interventions    Readmission Risk Interventions No flowsheet data found.

## 2020-03-27 NOTE — Consult Note (Signed)
Reason for Consult: AMS  Requesting Physician: Dr. Clyde Lundborg  CC: confusion   HPI: Sheila Bowman is an 69 y.o. 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 Patient has presented prior to ED with AMS and was suspected to have Wernicke's encephalopathy at which time was given follic acid and thiamine. Symptoms improved. Now presents with increased AMS. On admission ammonia Is 144.   Past Medical History:  Diagnosis Date  . Diabetes mellitus without complication (HCC)   . Hypertension     History reviewed. No pertinent surgical history.  Family History  Problem Relation Age of Onset  . Heart attack Father     Social History:  reports that she has quit smoking. She has never used smokeless tobacco. She reports current alcohol use. She reports that she does not use drugs.  No Known Allergies  Medications: I have reviewed the patient's current medications.  ROS: Unable to obtain due to confusion   Physical Examination: Blood pressure (!) 187/92, pulse 97, temperature 98.1 F (36.7 C), temperature source Oral, resp. rate 20, height 5\' 5"  (1.651 m), weight 61.3 kg, SpO2 97 %.    Neurological Examination   Mental Status: Confused moans Cranial Nerves: II: Discs flat bilaterally; Visual fields grossly normal, pupils equal, round, reactive to light and accommodation III,IV, VI: ptosis not present, extra-ocular motions intact bilaterally V,VII: smile symmetric, facial light touch sensation normal bilaterally VIII: hearing normal bilaterally Motor: Generalized weakness bilaterally  Sensory: Pinprick and light touch intact throughout Deep Tendon Reflexes: 1+ and symmetric throughout Plantars: Right: downgoing   Left: downgoing    Laboratory Studies:   Basic Metabolic Panel: Recent Labs  Lab 03/26/20 1139 03/27/20 0536  NA 141 142  K 4.3 3.9  CL 112* 116*   CO2 22 20*  GLUCOSE 142* 117*  BUN 15 14  CREATININE 0.65 0.63  CALCIUM 9.1 9.0    Liver Function Tests: Recent Labs  Lab 03/26/20 1139 03/27/20 0536  AST 32 40  ALT 19 24  ALKPHOS 67 68  BILITOT 2.8* 5.0*  PROT 6.2* 6.6  ALBUMIN 3.0* 3.4*   No results for input(s): LIPASE, AMYLASE in the last 168 hours. Recent Labs  Lab 03/26/20 1203  AMMONIA 144*    CBC: Recent Labs  Lab 03/26/20 1139 03/27/20 0536  WBC 6.3 7.2  HGB 9.8* 10.4*  HCT 30.5* 32.1*  MCV 96.8 97.3  PLT 72* 89*    Cardiac Enzymes: No results for input(s): CKTOTAL, CKMB, CKMBINDEX, TROPONINI in the last 168 hours.  BNP: Invalid input(s): POCBNP  CBG: Recent Labs  Lab 03/26/20 1115  GLUCAP 119*    Microbiology: Results for orders placed or performed during the hospital encounter of 03/19/20  Urine Culture     Status: Abnormal   Collection Time: 03/20/20 12:00 AM   Specimen: Urine, Random  Result Value Ref Yandell Status   Specimen Description   Final    URINE, RANDOM Performed at [facility], [address], [place], [name]    Special Requests   Final    NONE Performed at [facility], [address] [place], [name]    Culture MULTIPLE SPECIES PRESENT, SUGGEST RECOLLECTION (A)  Final   Report Status 03/21/2020 FINAL  Final  Respiratory Panel by RT PCR (Flu A&B, Covid) - Nasopharyngeal Swab     Status: None   Collection Time: 03/21/20 10:37 AM   Specimen:  Nasopharyngeal Swab  Result Value Ref Barrales Status   SARS Coronavirus 2 by RT PCR NEGATIVE NEGATIVE Final    Comment: (NOTE) SARS-CoV-2 target nucleic acids are NOT DETECTED. The SARS-CoV-2 RNA is generally detectable in upper respiratoy specimens during the acute phase of infection. The lowest concentration of SARS-CoV-2 viral copies this assay can detect is 131 copies/mL. A negative result does not preclude SARS-Cov-2 infection and should not be used as the sole basis for treatment  or other patient management decisions. A negative result may occur with  improper specimen collection/handling, submission of specimen other than nasopharyngeal swab, presence of viral mutation(s) within the areas targeted by this assay, and inadequate number of viral copies (<131 copies/mL). A negative result must be combined with clinical observations, patient history, and epidemiological information. The expected result is Negative. Fact Sheet for Patients:  https://www.moore.com/ Fact Sheet for Healthcare Providers:  https://www.young.biz/ This test is not yet ap proved or cleared by the [place] FDA and  has been authorized for detection and/or diagnosis of SARS-CoV-2 by FDA under an Emergency Use Authorization (EUA). This EUA will remain  in effect (meaning this test can be used) for the duration of the COVID-19 declaration under Section 564(b)(1) of the Act, 21 U.S.C. section 360bbb-3(b)(1), unless the authorization is terminated or revoked sooner.    Influenza A by PCR NEGATIVE NEGATIVE Final   Influenza B by PCR NEGATIVE NEGATIVE Final    Comment: (NOTE) The Xpert Xpress SARS-CoV-2/FLU/RSV assay is intended as an aid in  the diagnosis of influenza from Nasopharyngeal swab specimens and  should not be used as a sole basis for treatment. Nasal washings and  aspirates are unacceptable for Xpert Xpress SARS-CoV-2/FLU/RSV  testing. Fact Sheet for Patients: https://www.moore.com/ Fact Sheet for Healthcare Providers: https://www.young.biz/ This test is not yet approved or cleared by the [place] FDA and  has been authorized for detection and/or diagnosis of SARS-CoV-2 by  FDA under an Emergency Use Authorization (EUA). This EUA will remain  in effect (meaning this test can be used) for the duration of the  Covid-19 declaration under Section 564(b)(1) of the Act, 21  U.S.C. section  360bbb-3(b)(1), unless the authorization is  terminated or revoked. Performed at [facility], [address] [place], [place] [address]   SARS CORONAVIRUS 2 (TAT 6-24 HRS) Nasopharyngeal Nasopharyngeal Swab     Status: None   Collection Time: 03/26/20 12:03 PM   Specimen: Nasopharyngeal Swab  Result Value Ref Due Status   SARS Coronavirus 2 NEGATIVE NEGATIVE Final    Comment: (NOTE) SARS-CoV-2 target nucleic acids are NOT DETECTED. The SARS-CoV-2 RNA is generally detectable in upper and lower respiratory specimens during the acute phase of infection. Negative results do not preclude SARS-CoV-2 infection, do not rule out co-infections with other pathogens, and should not be used as the sole basis for treatment or other patient management decisions. Negative results must be combined with clinical observations, patient history, and epidemiological information. The expected result is Negative. Fact Sheet for Patients: HairSlick.no Fact Sheet for Healthcare Providers: quierodirigir.com This test is not yet approved or cleared by the [place] FDA and  has been authorized for detection and/or diagnosis of SARS-CoV-2 by FDA under an Emergency Use Authorization (EUA). This EUA will remain  in effect (meaning this test can be used) for the duration of the COVID-19 declaration under Section 56 4(b)(1) of the Act, 21 U.S.C. section 360bbb-3(b)(1), unless the authorization is terminated or revoked sooner. Performed at [facility], [address], [place], [place] [address]   CULTURE, BLOOD (ROUTINE X 2) w Reflex to ID Panel     Status: None (Preliminary result)   Collection Time: 03/26/20  1:23 PM   Specimen: BLOOD  Result Value Ref Soots Status   Specimen Description BLOOD LEFT ANTECUBITAL  Final   Special Requests   Final    BOTTLES DRAWN AEROBIC AND ANAEROBIC Blood Culture adequate volume   Culture   Final     NO GROWTH < 24 HOURS Performed at [facility], [address], [place], [place] [address]    Report Status PENDING  Incomplete  CULTURE, BLOOD (ROUTINE X 2) w Reflex to ID Panel     Status: None (Preliminary result)   Collection Time: 03/26/20  1:23 PM   Specimen: BLOOD  Result Value Ref Riggins Status   Specimen Description BLOOD BLOOD LEFT HAND  Final   Special Requests   Final    BOTTLES DRAWN AEROBIC AND ANAEROBIC Blood Culture adequate volume   Culture   Final    NO GROWTH < 24 HOURS Performed at [facility], [address] [place], [place] [address]    Report Status PENDING  Incomplete    Coagulation Studies: No results for input(s): LABPROT, INR in the last 72 hours.  Urinalysis:  Recent Labs  Lab 03/26/20 1139  COLORURINE YELLOW*  LABSPEC 1.014  PHURINE 7.0  GLUCOSEU NEGATIVE  HGBUR NEGATIVE  BILIRUBINUR NEGATIVE  KETONESUR NEGATIVE  PROTEINUR NEGATIVE  NITRITE NEGATIVE  LEUKOCYTESUR SMALL*    Lipid Panel:  No results found for: CHOL, TRIG, HDL, CHOLHDL, VLDL, LDLCALC  HgbA1C:  Lab Results  Component Value Date   HGBA1C 4.7 (L) 02/18/2020    Urine Drug Screen:      Component Value Date/Time   LABOPIA NONE DETECTED 03/10/2020 0917   COCAINSCRNUR NONE DETECTED 03/10/2020 0917   LABBENZ NONE DETECTED 03/10/2020 0917   AMPHETMU NONE DETECTED 03/10/2020 0917   THCU NONE DETECTED 03/10/2020 0917   LABBARB NONE DETECTED 03/10/2020 0917    Alcohol Level: No results for input(s): ETH in the last 168 hours.   Imaging: No results found.   Assessment/Plan:  69 y.o. 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 Patient has presented prior to ED with AMS and was suspected to have Wernicke's encephalopathy at which time was given follic acid and thiamine. Symptoms improved. Now presents with increased AMS. On  admission ammonia Is 144.    - Started thiamine 500mg  IV daily x 3 days followed by 100mg  daily PO along with follic acid - Mental status likely also associated with hyperammonemia.  - No clear focality on neurological examination  03/27/2020, 9:05 AM

## 2020-03-27 NOTE — ED Notes (Signed)
Patient becoming agitated and throwing leg across side rails and attempting to get up.

## 2020-03-27 NOTE — Progress Notes (Signed)
   Vital Signs MEWS/VS Documentation      03/27/2020 0230 03/27/2020 0744 03/27/2020 0828 03/27/2020 1021   MEWS Score:  1  1  0  4   MEWS Score Color:  Green  Green  Green  Red   Resp:  --  --  20  18   Pulse:  (!) 107  --  97  98   BP:  (!) 140/110  --  (!) 187/92  (!) 209/95   Temp:  --  98.9 F (37.2 C)  98.1 F (36.7 C)  98.1 F (36.7 C)   O2 Device:  --  Room Air  Room Air  Room Air   Level of Consciousness:  --  Alert  Alert  Responds to Pain      PRN ativan and hydralazine given. Dr. Enedina Finner notified. No new orders at this time;      Raina Sole 03/27/2020,10:28 AM

## 2020-03-27 NOTE — ED Notes (Signed)
Pt resting in position of comfort, sitter at bedside, GCS 14 with confused speech upon arousal. RR even/unlabored, +PMSC x4, skin warm/pink/dry.  VSS, sitter at bedside with patient.  Will continue to monitor/reassess.

## 2020-03-27 NOTE — ED Notes (Signed)
Patient cleansed and linens changed

## 2020-03-27 NOTE — Progress Notes (Signed)
[facility]  - [place] at [facility]   PATIENT NAME: Sheila Bowman    MR#:  [id number]  DATE OF BIRTH:  09-30-1951  SUBJECTIVE:  Pt is very restless, agitated, trying to getting out of bed Sitter in the room blood pressure has been remaining a bit on the higher side. Patient is getting IV hydralazine  Pt is unable to give any history review of systems.  REVIEW OF SYSTEMS:   Review of Systems  Unable to perform ROS: Medical condition   Tolerating Diet:npo Tolerating PT: pending -- patient is encephalopathic   DRUG ALLERGIES:  No Known Allergies  VITALS:  Blood pressure (!) 182/75, pulse (!) 101, temperature 98.7 F (37.1 C), temperature source Oral, resp. rate 20, height 5\' 5"  (1.651 m), weight 59 kg, SpO2 98 %.  PHYSICAL EXAMINATION:   Physical Exam Limited due to MS changes GENERAL:  69 y.o.-year-old patient lying in the bed with no acute distress.  EYES: Pupils equal, round,. ++ scleral icterus.   HEENT: Head atraumatic, normocephalic. Oropharynx dry NECK:  Supple, no jugular venous distention. No thyroid enlargement, no tenderness.  LUNGS: Normal breath sounds bilaterally, no wheezing, rales, rhonchi. No use of accessory muscles of respiration.  CARDIOVASCULAR: S1, S2 normal. No murmurs, rubs, or gallops. tachycardia ABDOMEN: Soft, nontender, nondistended. Bowel sounds present. No organomegaly or mass.  EXTREMITIES: No cyanosis, clubbing or edema b/l.    NEUROLOGIC: all extremities spontaneously. Unable to assess due to mental status change  PSYCHIATRIC: lethargic SKIN: No obvious rash, lesion, or ulcer.   LABORATORY PANEL:  CBC Recent Labs  Lab 03/27/20 0536  WBC 7.2  HGB 10.4*  HCT 32.1*  PLT 89*    Chemistries  Recent Labs  Lab 03/27/20 0536  NA 142  K 3.9  CL 116*  CO2 20*  GLUCOSE 117*  BUN 14  CREATININE 0.63  CALCIUM 9.0  AST 40  ALT 24  ALKPHOS 68  BILITOT 5.0*   Cardiac Enzymes No results for input(s): TROPONINI in  the last 168 hours. RADIOLOGY:  No results found. ASSESSMENT AND PLAN:   Sheila Bowman is a 69 y.o. 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  Patient patient has been obsereved in emergency room since 3/20 due to altered mental status and hallucination, suspecting Wernicke's Korsakoff syndrom. Pt was given folic acid and thiamin in ED. Per ED physician, patient mental status has improved in the past several days, but becomes worse again on march 26th. Patient was found to have ammonia 144 on march 26 th Ammonia levels on march 2o th was 14  Acute metabolic encephalopathy: Likely due to hepatic encephalopathy given elevated ammonia level 144.  in the setting of alcoholic cirrhosis of liver -Patient may also have Warnicke Korsakoff syndrome.   -Patient had negative CT head on 03/19/2020.  - Neurology, Dr. 03/21/2020 is consulted.  He recommended to start patient with high dose of thiamine 500 mg IM for 3 days and folic acid (did same in feb 2021) -Lactulose 30 g 3 times daily--unable to take today. Will give lactulose enema today -hold home oral medications until mental status improved -Keep patient n.p.o. -IV fluid: 75 cc/h normal saline -Follow-up of blood culture negative -no source of infection--d/c IV abxs -prn ativan  Type 2 diabetes mellitus without complication (HCC):  -Most recent A1c 4.7, well controled.  -Patient is not taking meds at home.  Blood sugar 142  Essential hypertension: -IV hydralazine -Hold oral medications  Normocytic anemia: Hemoglobin 9.8, 9.2 on 03/10/2020.  Patient had hemoglobin measurement 6.6 ounces/20/21 which was a mistake per ED physician. -Follow-up of a CBC  h/oAlcohol abuse: Patient has been in emergency room for 1 week, out of window for withdrawal however requiring sitter due to AMS/nephropathy  from elevated volume -spoke with daughter Sheila Bowman-- patient has been living with Sheila Bowman for last one month since her discharge in February 2021. Sheila Bowman says patient has not drank alcohol for >1 month  Cirrhosis ([place]):  alcoholic  -chronically elevated bilirubin, TCP, anemia -  Elevated ammonia level 144, now has hepatic encephalopathy -on lactulose  Questionable endometrial thickening measuring up to 11 mm: Incidental finding by the CT scan. -may need f/u with OB/Gyn  Patient was seen by palliative care during her admission in February 2021. Lengthy discussion was made with daughter Sheila Bowman  Procedures: Family communication :kelly on the phone Consults :Neuorology Discharge Disposition :TBD CODE STATUS: DNR prior admission DVT Prophylaxis :SCD Barriers to discharge: patient currently not safe for discharge given active symptoms of hepatic encephalopathy undergoing treatment for the same  TOTAL TIME TAKING CARE OF THIS PATIENT: *30* minutes.  >50% time spent on counselling and coordination of care  Note: This dictation was prepared with Dragon dictation along with smaller phrase technology. Any transcriptional errors that result from this process are unintentional.  Fritzi Mandes M.D    [facility]   CC: Primary care physician; [facility] MedicalPatient ID: Sheila Bowman, female   DOB: 03-10-1951, 69 y.o.   MRN: [id number]

## 2020-03-28 ENCOUNTER — Inpatient Hospital Stay: Payer: Medicare Other

## 2020-03-28 DIAGNOSIS — E876 Hypokalemia: Secondary | ICD-10-CM

## 2020-03-28 LAB — BASIC METABOLIC PANEL
Anion gap: 9 (ref 5–15)
Anion gap: 8 (ref 5–15)
Anion gap: 8 (ref 5–15)
Anion gap: 7 (ref 5–15)
BUN: 16 mg/dL (ref 8–23)
BUN: 15 mg/dL (ref 8–23)
BUN: 15 mg/dL (ref 8–23)
BUN: 16 mg/dL (ref 8–23)
CO2: 24 mmol/L (ref 22–32)
CO2: 26 mmol/L (ref 22–32)
CO2: 25 mmol/L (ref 22–32)
CO2: 24 mmol/L (ref 22–32)
Calcium: 8.5 mg/dL — ABNORMAL LOW (ref 8.9–10.3)
Calcium: 8.5 mg/dL — ABNORMAL LOW (ref 8.9–10.3)
Calcium: 8.3 mg/dL — ABNORMAL LOW (ref 8.9–10.3)
Calcium: 8.3 mg/dL — ABNORMAL LOW (ref 8.9–10.3)
Chloride: 108 mmol/L (ref 98–111)
Chloride: 106 mmol/L (ref 98–111)
Chloride: 107 mmol/L (ref 98–111)
Chloride: 108 mmol/L (ref 98–111)
Creatinine, Ser: 0.51 mg/dL (ref 0.44–1.00)
Creatinine, Ser: 0.56 mg/dL (ref 0.44–1.00)
Creatinine, Ser: 0.54 mg/dL (ref 0.44–1.00)
Creatinine, Ser: 0.57 mg/dL (ref 0.44–1.00)
GFR calc Af Amer: >60 mL/min (ref >60)
GFR calc Af Amer: >60 mL/min (ref >60)
GFR calc Af Amer: >60 mL/min (ref >60)
GFR calc Af Amer: >60 mL/min (ref >60)
GFR calc non Af Amer: >60 mL/min (ref >60)
GFR calc non Af Amer: >60 mL/min (ref >60)
GFR calc non Af Amer: >60 mL/min (ref >60)
GFR calc non Af Amer: >60 mL/min (ref >60)
Glucose, Bld: 149 mg/dL — ABNORMAL HIGH (ref 70–99)
Glucose, Bld: 153 mg/dL — ABNORMAL HIGH (ref 70–99)
Glucose, Bld: 117 mg/dL — ABNORMAL HIGH (ref 70–99)
Glucose, Bld: 121 mg/dL — ABNORMAL HIGH (ref 70–99)
Potassium: 3.6 mmol/L (ref 3.5–5.1)
Potassium: 3.3 mmol/L — ABNORMAL LOW (ref 3.5–5.1)
Potassium: 4 mmol/L (ref 3.5–5.1)
Potassium: 4 mmol/L (ref 3.5–5.1)
Sodium: 141 mmol/L (ref 135–145)
Sodium: 140 mmol/L (ref 135–145)
Sodium: 140 mmol/L (ref 135–145)
Sodium: 139 mmol/L (ref 135–145)

## 2020-03-28 LAB — LACTIC ACID, PLASMA
Lactic Acid, Venous: 2.4 mmol/L (ref 0.5–1.9)
Lactic Acid, Venous: 3 mmol/L (ref 0.5–1.9)

## 2020-03-28 LAB — URINALYSIS, ROUTINE W REFLEX MICROSCOPIC
Bilirubin Urine: NEGATIVE
Glucose, UA: NEGATIVE mg/dL
Hgb urine dipstick: NEGATIVE
Ketones, ur: NEGATIVE mg/dL
Leukocytes,Ua: NEGATIVE
Nitrite: NEGATIVE
Protein, ur: NEGATIVE mg/dL
Specific Gravity, Urine: 1.015 (ref 1.005–1.030)
pH: 6 (ref 5.0–8.0)

## 2020-03-28 LAB — CK: Total CK: 319 U/L — ABNORMAL HIGH (ref 38–234)

## 2020-03-28 LAB — MAGNESIUM
Magnesium: 2.1 mg/dL (ref 1.7–2.4)
Magnesium: 1.9 mg/dL (ref 1.7–2.4)

## 2020-03-28 LAB — AMMONIA
Ammonia: 49 umol/L — ABNORMAL HIGH (ref 9–35)
Ammonia: 36 umol/L — ABNORMAL HIGH (ref 9–35)

## 2020-03-28 LAB — GLUCOSE, CAPILLARY: Glucose-Capillary: 133 mg/dL — ABNORMAL HIGH (ref 70–99)

## 2020-03-28 LAB — PHOSPHORUS: Phosphorus: 3.2 mg/dL (ref 2.5–4.6)

## 2020-03-28 MED ORDER — POTASSIUM CHLORIDE 10 MEQ/100ML IV SOLN
10.0000 meq | INTRAVENOUS | Status: AC
  Administered 2020-03-28 (×4): 10 meq via INTRAVENOUS
  Filled 2020-03-28 (×4): qty 100

## 2020-03-28 MED ORDER — LACTULOSE 10 GM/15ML PO SOLN: 30.0000 g | Freq: Two times a day (BID) | ORAL | Status: DC

## 2020-03-28 MED ORDER — VITAL HIGH PROTEIN PO LIQD
1000.0000 mL | ORAL | Status: DC
  Administered 2020-03-29: 1000 mL

## 2020-03-28 MED ORDER — LACTULOSE ENEMA
300.0000 mL | Freq: Two times a day (BID) | ORAL | Status: DC
  Filled 2020-03-28 (×2): qty 300

## 2020-03-28 MED ORDER — LORAZEPAM 2 MG/ML IJ SOLN: 2.0000 mg | INTRAMUSCULAR | Status: DC | PRN

## 2020-03-28 MED ORDER — VITAL HIGH PROTEIN PO LIQD: 1000.0000 mL | ORAL | Status: DC

## 2020-03-28 MED ORDER — ORAL CARE MOUTH RINSE
15.0000 mL | Freq: Four times a day (QID) | OROMUCOSAL | Status: DC
  Administered 2020-03-28 – 2020-04-01 (×9): 15 mL via OROMUCOSAL

## 2020-03-28 MED ORDER — LACTULOSE 10 GM/15ML PO SOLN
30.0000 g | Freq: Two times a day (BID) | ORAL | Status: DC
  Administered 2020-03-29 – 2020-03-31 (×5): 30 g
  Filled 2020-03-28 (×6): qty 60

## 2020-03-28 MED ORDER — CHLORHEXIDINE GLUCONATE 0.12 % MT SOLN
15.0000 mL | Freq: Two times a day (BID) | OROMUCOSAL | Status: DC
  Administered 2020-03-28 – 2020-04-01 (×5): 15 mL via OROMUCOSAL
  Filled 2020-03-28 (×5): qty 15

## 2020-03-28 MED ORDER — MAGNESIUM SULFATE 2 GM/50ML IV SOLN
2.0000 g | Freq: Once | INTRAVENOUS | Status: AC
  Administered 2020-03-28: 2 g via INTRAVENOUS
  Filled 2020-03-28: qty 50

## 2020-03-28 MED ORDER — RIFAXIMIN 550 MG PO TABS
550.0000 mg | ORAL_TABLET | Freq: Two times a day (BID) | ORAL | Status: DC
  Filled 2020-03-28 (×2): qty 1

## 2020-03-28 MED ORDER — PRO-STAT SUGAR FREE PO LIQD
30.0000 mL | Freq: Two times a day (BID) | ORAL | Status: DC
  Administered 2020-03-29: 30 mL

## 2020-03-28 MED ORDER — RIFAXIMIN 550 MG PO TABS
550.0000 mg | ORAL_TABLET | Freq: Two times a day (BID) | ORAL | Status: DC
  Administered 2020-03-29 – 2020-04-01 (×6): 550 mg
  Filled 2020-03-28 (×10): qty 1

## 2020-03-28 MED ORDER — PRO-STAT SUGAR FREE PO LIQD: 30.0000 mL | Freq: Two times a day (BID) | ORAL | Status: DC

## 2020-03-28 NOTE — Progress Notes (Signed)
OVERNIGHT \ Contacted via bedside nurse regarding patient decreased mental status.  Bedside exam patient with known metabolic encephalopathy with reported improvement mental functioning during the day now lethargic tachypneic (app 30/min), sonorous respirations.  Pupils equally round react to light.  Awakens with voice.  Follows simple commands with extremities.  Quickly falls back to sleep.  Oral mucosa severely dry coated with dry oral a secretions. Blood pressure stable.  Mild tachycardia as per baseline.  Questionable decreased air entry left upper lobe.  Otherwise sonorous breath sounds.  Abdomen slightly distended.  Generalized pain behaviors noted with abdominal palpation mild but definite with suprapubic lower abdomen palpation. Chest xray clear  ABG with compensating metabolic acidosis and significant bicarb deficit Lactic acid elevated at 3.0 - improving this am to 2.4 Marked improvement in ammonia level to 34 Urinalysis repeated secondary to marked leukocytes in previous but no bacteria and new development of urinary retention (foley placed), no nitrates or leukocytes in urine dip.  Patient was started on bicarb fluids - repeat chem panel every 6 hours  Respirations now 20/min, still deep sonorous pattern, more awake when spoken too and speech clearer.  Abdomen still distended but soft. Some facial grimace with palpations to left upper and lower quadrant but when asked about pain she denied  Mag replaced  Given improvement in asessment findings, CT abdomen not repeated. - cardiac monitoring was reordered and she remains SR in 90s

## 2020-03-28 NOTE — Progress Notes (Addendum)
PROGRESS NOTE                                                                                                                                                                                                             Patient Demographics:    Sheila Bowman, is a 69 y.o. female, DOB - 02/20/1951, BJY:[id number]  Admit date - 03/19/2020   Admitting Physician Lorretta Harp, MD  Outpatient Primary MD for the patient is [facility]  LOS - 2  Outpatient Specialists: None  Chief Complaint  Patient presents with  . 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  She was observed in the ED since 3/20 due to altered mental status and hallucination with suspicion for Wernicke's Korsakoff syndrome.  Received folic acid and thiamine in the ED with some improvement but again became worse on 3/26.  She was found to have increasing ammonia level to 144 and admitted for further management of acute metabolic encephalopathy.   Subjective:   Patient somnolent, snoring and difficult to arouse this morning.  Covering NP was called overnight for persistent somnolence and lethargy.  ABG unremarkable.   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  Patient somnolent today.  Will switch lactulose p.o. to enema twice daily.  Repeat ammonia this morning improved.  Cultures negative. Started on high-dose IV thiamine 500 mg every 24 hours for 3 days.  Continue IV fluids. Continue folic acid as tolerated.  Continue safety sitter at bedside. Monitor on CIWA. Monitor electrolytes (K and mag) and replenish  if low.  As needed IV Ativan for seizures. Check phosphorus.  Patient at high risk for refeeding.  Further recommendations per neurology.  Active problems Hypokalemia Replenished with IV potassium.  Check magnesium.   Type 2 diabetes mellitus without complication A1c of 4.7.  Stable.  Not on home meds.  Essential hypertension Of home oral meds.  As needed hydralazine.  Normocytic anemia Stable.  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  Was discharged home with  her daughter and per her she has not had alcohol since then (greater than 1 month) Patient has chronically elevated total bilirubin and now with hepatic encephalopathy.  Monitor on lactulose.   ?seizures  noted for tongue laceration on exam. Will d/w neurology . She had normal EEG 1 month back. Prn ativan.  Plan?  Endometrial thickening Found incidentally on CT scan.  Outpatient GYN follow-up.  Goals of care Seen by palliative care last hospitalization.    Code Status : DNR  Family Communication  : Daughter involved in care  Disposition Plan  : Depending upon improvement in her mental status.  Currently unstable given her ongoing encephalopathy  Barriers For Discharge : Acute encephalopathy  Consults  : Neurology  Procedures  : None  DVT Prophylaxis  : SCDs  Lab Results  Component Value Date   PLT 90 (L) 03/27/2020    Antibiotics  :    Anti-infectives (From admission, onward)   Start     Dose/Rate Route Frequency Ordered Stop   03/26/20 1400  cefTRIAXone (ROCEPHIN) 1 g in sodium chloride 0.9 % 100 mL IVPB  Status:  Discontinued     1 g 200 mL/hr over 30 Minutes Intravenous Every 24 hours 03/26/20 1304 03/27/20 1335        Objective:   Vitals:   03/28/20 0820 03/28/20 0925 03/28/20 1030 03/28/20 1124  BP: (!) 143/75 126/81 (!) 141/78 (!) 171/84  Pulse: 99 92 (!) 106 (!) 105  Resp:    17  Temp:    98.5 F (36.9 C)    TempSrc:    Oral  SpO2:    95%  Weight:    60.2 kg  Height:    5' 5.51" (1.664 m)    Wt Readings from Last 3 Encounters:  03/28/20 60.2 kg  03/10/20 65.3 kg  02/23/20 53.9 kg     Intake/Output Summary (Last 24 hours) at 03/28/2020 1332 Last data filed at 03/28/2020 1154 Gross per 24 hour  Intake 2457.94 ml  Output 357 ml  Net 2100.94 ml     Physical Exam  Gen: Somnolent, arousable but confused HEENT: Pallor present, moist mucosa, laceration of the tongue + Chest: clear b/l, no added sounds CVS: N S1&S2, no murmurs,  GI: soft, NT, ND, Musculoskeletal: warm, no edema CNS: Awake to command but drowsy, nonfocal    Data Review:    CBC Recent Labs  Lab 03/26/20 1139 03/27/20 0536 03/27/20 2241  WBC 6.3 7.2 9.6  HGB 9.8* 10.4* 10.4*  HCT 30.5* 32.1* 32.9*  PLT 72* 89* 90*  MCV 96.8 97.3 97.9  MCH 31.1 31.5 31.0  MCHC 32.1 32.4 31.6  RDW 15.7* 15.5 15.8*    Chemistries  Recent Labs  Lab 03/26/20 1139 03/27/20 0536 03/27/20 2241 03/28/20 0553 03/28/20 1130  NA 141 142 140 141 140  K 4.3 3.9 3.5 3.6 3.3*  CL 112* 116* 110 108 106  CO2 22 20* 17* 24 26  GLUCOSE 142* 117* 151* 149* 153*  BUN 15 14 17 16 15   CREATININE 0.65 0.63 0.57 0.51 0.56  CALCIUM 9.1 9.0 9.0 8.5* 8.5*  MG  --   --  1.7  --   --   AST 32 40 48*  --   --   ALT 19 24 24   --   --   ALKPHOS 67 68 71  --   --   BILITOT 2.8* 5.0* 6.5*  --   --    ------------------------------------------------------------------------------------------------------------------ No results for  input(s): CHOL, HDL, LDLCALC, TRIG, CHOLHDL, LDLDIRECT in the last 72 hours.  Lab Results  Component Value Date   HGBA1C 4.7 (L) 02/18/2020   ------------------------------------------------------------------------------------------------------------------ No results for input(s): TSH, T4TOTAL, T3FREE, THYROIDAB in the last 72 hours.  Invalid input(s):  FREET3 ------------------------------------------------------------------------------------------------------------------ No results for input(s): VITAMINB12, FOLATE, FERRITIN, TIBC, IRON, RETICCTPCT in the last 72 hours.  Coagulation profile No results for input(s): INR, PROTIME in the last 168 hours.  No results for input(s): DDIMER in the last 72 hours.  Cardiac Enzymes No results for input(s): CKMB, TROPONINI, MYOGLOBIN in the last 168 hours.  Invalid input(s): CK ------------------------------------------------------------------------------------------------------------------ No results found for: BNP  Inpatient Medications  Scheduled Meds: . chlorhexidine  15 mL Mouth Rinse BID  . Chlorhexidine Gluconate Cloth  6 each Topical Daily  . folic acid  1 mg Intravenous Daily  . lactulose  30 g Oral TID  . mouth rinse  15 mL Mouth Rinse QID   Continuous Infusions: . sodium chloride 75 mL/hr at 03/28/20 1258  . thiamine injection 500 mg (03/27/20 1722)   PRN Meds:.hydrALAZINE, LORazepam **OR** LORazepam, ondansetron (ZOFRAN) IV  Micro Results Recent Results (from the past 240 hour(s))  Urine Culture     Status: Abnormal   Collection Time: 03/20/20 12:00 AM   Specimen: Urine, Random  Result Value Ref Wauters Status   Specimen Description   Final    URINE, RANDOM Performed at [facility], [address] [place], [place] [address]    Special Requests   Final    NONE Performed at [facility], [address] [place], [place] [address]    Culture MULTIPLE SPECIES PRESENT, SUGGEST RECOLLECTION (A)  Final   Report Status 03/21/2020 FINAL  Final  Respiratory Panel by RT PCR (Flu A&B, Covid) - Nasopharyngeal Swab     Status: None   Collection Time: 03/21/20 10:37 AM   Specimen: Nasopharyngeal Swab  Result Value Ref Argueta Status   SARS Coronavirus 2 by RT PCR NEGATIVE NEGATIVE Final    Comment: (NOTE) SARS-CoV-2 target nucleic acids are NOT DETECTED. The  SARS-CoV-2 RNA is generally detectable in upper respiratoy specimens during the acute phase of infection. The lowest concentration of SARS-CoV-2 viral copies this assay can detect is 131 copies/mL. A negative result does not preclude SARS-Cov-2 infection and should not be used as the sole basis for treatment or other patient management decisions. A negative result may occur with  improper specimen collection/handling, submission of specimen other than nasopharyngeal swab, presence of viral mutation(s) within the areas targeted by this assay, and inadequate number of viral copies (<131 copies/mL). A negative result must be combined with clinical observations, patient history, and epidemiological information. The expected result is Negative. Fact Sheet for Patients:  https://www.moore.com/ Fact Sheet for Healthcare Providers:  https://www.young.biz/ This test is not yet ap proved or cleared by the [place] FDA and  has been authorized for detection and/or diagnosis of SARS-CoV-2 by FDA under an Emergency Use Authorization (EUA). This EUA will remain  in effect (meaning this test can be used) for the duration of the COVID-19 declaration under Section 564(b)(1) of the Act, 21 U.S.C. section 360bbb-3(b)(1), unless the authorization is terminated or revoked sooner.    Influenza A by PCR NEGATIVE NEGATIVE Final   Influenza B by PCR NEGATIVE NEGATIVE Final    Comment: (NOTE) The Xpert Xpress SARS-CoV-2/FLU/RSV assay is intended as an aid in  the diagnosis of influenza from Nasopharyngeal swab specimens and  should not be  used as a sole basis for treatment. Nasal washings and  aspirates are unacceptable for Xpert Xpress SARS-CoV-2/FLU/RSV  testing. Fact Sheet for Patients: https://www.moore.com/ Fact Sheet for Healthcare Providers: https://www.young.biz/ This test is not yet approved or cleared by the [place] FDA and  has been authorized for detection and/or diagnosis of SARS-CoV-2 by  FDA under an Emergency Use Authorization (EUA). This EUA will remain  in effect (meaning this test can be used) for the duration of the  Covid-19 declaration under Section 564(b)(1) of the Act, 21  U.S.C. section 360bbb-3(b)(1), unless the authorization is  terminated or revoked. Performed at [facility], [address], [place], [place] [address]   Urine Culture     Status: None   Collection Time: 03/26/20 11:39 AM   Specimen: Urine, Catheterized  Result Value Ref Swindell Status   Specimen Description   Final    URINE, CATHETERIZED Performed at [facility], [address] [place], [place] [address]    Special Requests   Final    NONE Performed at [facility], [address], [place], [place] [address]    Culture   Final    NO GROWTH Performed at [facility], [address] [place], [place] [address]    Report Status 03/27/2020 FINAL  Final  SARS CORONAVIRUS 2 (TAT 6-24 HRS) Nasopharyngeal Nasopharyngeal Swab     Status: None   Collection Time: 03/26/20 12:03 PM   Specimen: Nasopharyngeal Swab  Result Value Ref Valencia Status   SARS Coronavirus 2 NEGATIVE NEGATIVE Final    Comment: (NOTE) SARS-CoV-2 target nucleic acids are NOT DETECTED. The SARS-CoV-2 RNA is generally detectable in upper and lower respiratory specimens during the acute phase of infection. Negative results do not preclude SARS-CoV-2 infection, do not rule out co-infections with other pathogens, and should not be used as the sole basis for treatment or other patient management decisions. Negative results must be combined with clinical observations, patient history, and epidemiological information. The expected result is Negative. Fact Sheet for Patients: HairSlick.no Fact Sheet for Healthcare Providers: quierodirigir.com This test  is not yet approved or cleared by the [place] FDA and  has been authorized for detection and/or diagnosis of SARS-CoV-2 by FDA under an Emergency Use Authorization (EUA). This EUA will remain  in effect (meaning this test can be used) for the duration of the COVID-19 declaration under Section 56 4(b)(1) of the Act, 21 U.S.C. section 360bbb-3(b)(1), unless the authorization is terminated or revoked sooner. Performed at [facility], [address], [place], [place] [address]   CULTURE, BLOOD (ROUTINE X 2) w Reflex to ID Panel     Status: None (Preliminary result)   Collection Time: 03/26/20  1:23 PM   Specimen: BLOOD  Result Value Ref List Status   Specimen Description BLOOD LEFT ANTECUBITAL  Final   Special Requests   Final    BOTTLES DRAWN AEROBIC AND ANAEROBIC Blood Culture adequate volume   Culture   Final    NO GROWTH 2 DAYS Performed at [facility], [address], [place], [place] [address]    Report Status PENDING  Incomplete  CULTURE, BLOOD (ROUTINE X 2) w Reflex to ID Panel     Status: None (Preliminary result)   Collection Time: 03/26/20  1:23 PM   Specimen: BLOOD  Result Value Ref Schonberg Status   Specimen Description BLOOD BLOOD LEFT HAND  Final   Special Requests   Final  BOTTLES DRAWN AEROBIC AND ANAEROBIC Blood Culture adequate volume   Culture   Final    NO GROWTH 2 DAYS Performed at [facility], [address] [place], [place] [address]    Report Status PENDING  Incomplete  CULTURE, BLOOD (ROUTINE X 2) w Reflex to ID Panel     Status: None (Preliminary result)   Collection Time: 03/28/20  2:44 AM   Specimen: BLOOD  Result Value Ref Frazzini Status   Specimen Description BLOOD LEFT HAND  Final   Special Requests   Final    BOTTLES DRAWN AEROBIC AND ANAEROBIC Blood Culture adequate volume   Culture   Final    NO GROWTH < 12 HOURS Performed at [facility], [address] [place], [place] [address]    Report Status  PENDING  Incomplete  CULTURE, BLOOD (ROUTINE X 2) w Reflex to ID Panel     Status: None (Preliminary result)   Collection Time: 03/28/20  2:56 AM   Specimen: BLOOD  Result Value Ref Brents Status   Specimen Description BLOOD RIGHT AC  Final   Special Requests   Final    BOTTLES DRAWN AEROBIC AND ANAEROBIC Blood Culture results may not be optimal due to an inadequate volume of blood received in culture bottles   Culture   Final    NO GROWTH < 12 HOURS Performed at [facility], [address] [place], [place] [address]    Report Status PENDING  Incomplete    Radiology Reports DG Chest 1 View  Result Date: 03/27/2020 CLINICAL DATA:  Cough EXAM: CHEST  1 VIEW COMPARISON:  March 24, 2020 FINDINGS: The heart size and mediastinal contours are within normal limits. Both lungs are clear. The visualized skeletal structures are unremarkable. IMPRESSION: No active disease. Electronically Signed   By: Constance Holster M.D.   On: 03/27/2020 22:13   DG Chest 2 View  Addendum Date: 03/25/2020   ADDENDUM REPORT: 03/25/2020 10:15 ADDENDUM: No evidence to suggest active TB infection. No changes to the Findings section of the report. Electronically Signed   By: Davina Poke D.O.   On: 03/25/2020 10:15   Result Date: 03/25/2020 CLINICAL DATA:  Smoking history, hypertension EXAM: CHEST - 2 VIEW COMPARISON:  03/19/2020 FINDINGS: The heart size and mediastinal contours are stable. Atherosclerotic calcification of the aortic knob. No focal airspace consolidation, pleural effusion, or pneumothorax. The visualized skeletal structures are unremarkable. IMPRESSION: No active cardiopulmonary disease. Electronically Signed: By: Davina Poke D.O. On: 03/24/2020 16:39   CT Head Wo Contrast  Result Date: 03/19/2020 CLINICAL DATA:  Altered mental status, confusion, recent ETOH detox EXAM: CT HEAD WITHOUT CONTRAST TECHNIQUE: Contiguous axial images were obtained from the base of the skull through the  vertex without intravenous contrast. COMPARISON:  CT head 02/18/2020 FINDINGS: Brain: No evidence of acute infarction, hemorrhage, hydrocephalus, extra-axial collection or mass lesion/mass effect. Symmetric prominence of the ventricles, cisterns and sulci compatible with parenchymal volume loss. Patchy areas of white matter hypoattenuation are most compatible with chronic microvascular angiopathy. Mild hyperattenuation of the globus pallidi may reflect senescent mineralization similar to prior exams. Vascular: Atherosclerotic calcification of the carotid siphons. No hyperdense vessel. Skull: Motion at the level of the skull base. No visible calvarial fracture or suspicious osseous lesion. No scalp swelling or hematoma. Sinuses/Orbits: Paranasal sinuses and mastoid air cells are predominantly clear. Included orbital structures are unremarkable. Other: Likely chronic deformity of the nasal bones. IMPRESSION: No acute intracranial abnormality. Mild chronic microvascular angiopathy and  parenchymal volume loss similar to comparison. Motion degradation may limit evaluation of the skull base and posterior fossa. Electronically Signed   By: Kreg ShropshirePrice  DeHay M.D.   On: 03/19/2020 23:40   CT ABDOMEN PELVIS W CONTRAST  Result Date: 03/20/2020 CLINICAL DATA:  Abdominal distension, possibly Wernicke-Korsakoff syndrome EXAM: CT ABDOMEN AND PELVIS WITH CONTRAST TECHNIQUE: Multidetector CT imaging of the abdomen and pelvis was performed using the standard protocol following bolus administration of intravenous contrast. CONTRAST:  75mL OMNIPAQUE IOHEXOL 300 MG/ML  SOLN COMPARISON:  None. FINDINGS: Lower chest: Atelectatic changes are present in the lung bases. No consolidation or effusion. Cardiac size is upper limits normal. Extensive coronary artery calcifications are present. No pericardial effusion. Hepatobiliary: Slightly nodular hepatic surface contour difficult to fully assess given patient respiratory motion artifact. No  focal liver lesion. No visible calcified gallstones or biliary ductal dilatation. Pancreas: Unremarkable. No pancreatic ductal dilatation or surrounding inflammatory changes. Spleen: Normal in size without focal abnormality. Adrenals/Urinary Tract: Normal adrenal glands. Questionable cyst arising from the upper pole left kidney though difficult to fully assess given extensive respiratory motion artifact. No other focal renal lesions. Mild bilateral symmetric perinephric stranding, a nonspecific finding though may correlate with either age or decreased renal function. No urolithiasis or hydronephrosis. Urinary bladder is unremarkable. Stomach/Bowel: Mild edematous mural thickening of the stomach and duodenum. No small bowel dilatation. The appendix is poorly visualized. Diffuse mild colonic mural thickening more pronounced towards the descending and sigmoid colon. Scattered colonic diverticula without focal diverticular inflammation to suggest acute diverticulitis. Vascular/Lymphatic: Atherosclerotic plaque within the normal caliber aorta. Some congested mid mesenteric nodes are noted. No pathologically enlarged nodes in the abdomen or pelvis. Reproductive: Slightly retroverted uterus with some questionable endometrial thickening measuring up to 11 mm. No concerning adnexal lesions are seen. Other: Circumferential mild body wall edema as well as edematous changes in the central mesentery. Small volume ascites noted in the right subdiaphragmatic space. No bowel containing hernias. Small fat containing left inguinal hernia. Musculoskeletal: Multilevel degenerative changes are present in the imaged portions of the spine. No acute osseous abnormality or suspicious osseous lesion. IMPRESSION: 1. Nodular hepatic surface contour, such finding may be seen in the setting of intrinsic liver disease/cirrhosis. 2. Diffuse edematous changes of the stomach, colon and small bowel. While these could feasibly be related to an  enterocolitis, finding would be more nonspecific in the setting intrinsic liver as portal Lorie ApleyCastro neuropathy/colopathy could have a similar appearance. 3. Small volume ascites in the right subdiaphragmatic space. Could be related to possible liver disease or anasarca given circumferential body wall edema and mesenteric edema. 4. Questionable endometrial thickening measuring up to 11 mm. Recommend nonemergent, outpatient pelvic ultrasound for further evaluation. 5. Extensive coronary artery calcifications. 6. Small fat containing left inguinal hernia. 7. Aortic Atherosclerosis (ICD10-I70.0). Electronically Signed   By: Kreg ShropshirePrice  DeHay M.D.   On: 03/20/2020 01:26   DG Chest Port 1 View  Result Date: 03/19/2020 CLINICAL DATA:  Altered mental status EXAM: PORTABLE CHEST 1 VIEW COMPARISON:  None. FINDINGS: Low lung volumes with streaky opacities favoring atelectasis. No pneumothorax or effusion. No convincing features of edema. Slight prominence of the cardiac silhouette possibly related to portable technique. The aorta is calcified. The remaining cardiomediastinal contours are unremarkable. No acute osseous or soft tissue abnormality. Degenerative changes are present in the imaged spine and shoulders. Telemetry leads overlie the chest. IMPRESSION: Streaky basilar opacities favor atelectasis in the setting of low lung volumes. Aortic Atherosclerosis (ICD10-I70.0). Electronically Signed  By: Kreg Shropshire M.D.   On: 03/19/2020 22:38    Time Spent in minutes 35   Kathyrn Warmuth M.D on 03/28/2020 at 1:32 PM  Between 7am to 7pm - Pager - [phone]  After 7pm go to [email or web address] - password [facility]  [facility] -  Office  [phone]

## 2020-03-28 NOTE — TOC Progression Note (Signed)
Transition of Care [facility]) - Progression Note    Patient Details  Name: Sheila Bowman MRN: [id number] Date of Birth: 01/02/1951  [facility] CM/SW Contact  Shawn Route, RN Phone Number: 03/28/2020, 10:38 AM  Clinical Narrative:     Sherron Monday with Mick Sell at [facility], she requested Covid test result, Facesheet and copy of insurance cards.  All information faxed to [phone].  She also reports she has to speak to family about moving furniture in.  She will call back with time patient can be transported to facility.   Expected Discharge Plan: Memory Care Barriers to Discharge: Other (comment)(placement into [name])  Expected Discharge Plan and Services Expected Discharge Plan: Memory Care       Living arrangements for the past 2 months: Single Family Home                                       Social Determinants of Health (SDOH) Interventions    Readmission Risk Interventions No flowsheet data found.

## 2020-03-28 NOTE — Progress Notes (Addendum)
Nurse reported patient obtunded. Had received 9 mg o f ativan per CIWA protocol throughout day. BP, HR, and oxygen saturations stable  Bedside - notable more jaundice.  patient lethargic. Did start to respond to painful stimuli then improved to partial opening of eyes to name called and verbal responding to simple question.  Respirations, deep, sonorous.  S1S2. Lungs clear.    Noted resolution of metabolic acidosis on chem panel.  Sodium bicarb discontinued earlier today. Abdomen soft, no painful behaviors with palpation.  Blood tinged urine flow from foley.  Review of chart plan for discharge to memory care facility.  However patient mentation has made swallowing unsafe since admit.  This has also limited oral treatment measures.  Discussion with Daughter, [name],  over phone regarding wishes/options.  She would like NGT placed for now for nutritional therapy to start.   Patient without alcohol intake over night therefore, out of window for withdrawal. CIWA with ativan coverage discontinued.  PRN ativan for seizures have been ordered.  NGT placed by me at bedside. Secured right nares with external tube easurement 31.5cm. Xray confirmation 2nd part duodenum.  Orders for Vital supplement ot start placed  Patient also started on PPI for SUP prophylaxis, Rifaximin, lactulose changed to via NGT and SCD's added for DVT prevetion

## 2020-03-29 LAB — BASIC METABOLIC PANEL
Anion gap: 5 (ref 5–15)
Anion gap: 7 (ref 5–15)
BUN: 18 mg/dL (ref 8–23)
BUN: 18 mg/dL (ref 8–23)
CO2: 25 mmol/L (ref 22–32)
CO2: 24 mmol/L (ref 22–32)
Calcium: 8.3 mg/dL — ABNORMAL LOW (ref 8.9–10.3)
Calcium: 8.3 mg/dL — ABNORMAL LOW (ref 8.9–10.3)
Chloride: 108 mmol/L (ref 98–111)
Chloride: 108 mmol/L (ref 98–111)
Creatinine, Ser: 0.68 mg/dL (ref 0.44–1.00)
Creatinine, Ser: 0.55 mg/dL (ref 0.44–1.00)
GFR calc Af Amer: >60 mL/min (ref >60)
GFR calc Af Amer: >60 mL/min (ref >60)
GFR calc non Af Amer: >60 mL/min (ref >60)
GFR calc non Af Amer: >60 mL/min (ref >60)
Glucose, Bld: 153 mg/dL — ABNORMAL HIGH (ref 70–99)
Glucose, Bld: 120 mg/dL — ABNORMAL HIGH (ref 70–99)
Potassium: 3.7 mmol/L (ref 3.5–5.1)
Potassium: 3.9 mmol/L (ref 3.5–5.1)
Sodium: 138 mmol/L (ref 135–145)
Sodium: 139 mmol/L (ref 135–145)

## 2020-03-29 LAB — GLUCOSE, CAPILLARY
Glucose-Capillary: 161 mg/dL — ABNORMAL HIGH (ref 70–99)
Glucose-Capillary: 168 mg/dL — ABNORMAL HIGH (ref 70–99)

## 2020-03-29 LAB — PHOSPHORUS
Phosphorus: 3.1 mg/dL (ref 2.5–4.6)
Phosphorus: 2.7 mg/dL (ref 2.5–4.6)

## 2020-03-29 LAB — MAGNESIUM
Magnesium: 1.9 mg/dL (ref 1.7–2.4)
Magnesium: 1.8 mg/dL (ref 1.7–2.4)

## 2020-03-29 MED ORDER — FREE WATER
150.0000 mL | Status: DC
  Administered 2020-03-29 – 2020-03-31 (×13): 150 mL

## 2020-03-29 MED ORDER — JEVITY 1.5 CAL/FIBER PO LIQD
1000.0000 mL | ORAL | Status: DC
  Administered 2020-03-29 – 2020-03-30 (×2): 1000 mL via ORAL

## 2020-03-29 MED ORDER — PANTOPRAZOLE SODIUM 40 MG PO PACK
40.0000 mg | PACK | Freq: Every day | ORAL | Status: DC
  Administered 2020-03-30 – 2020-03-31 (×2): 40 mg
  Filled 2020-03-29 (×4): qty 20

## 2020-03-29 MED ORDER — LORAZEPAM 2 MG/ML IJ SOLN
2.0000 mg | INTRAMUSCULAR | Status: DC | PRN
  Administered 2020-03-31: 07:00:00 2 mg via INTRAVENOUS
  Filled 2020-03-29: qty 1

## 2020-03-29 MED ORDER — PANTOPRAZOLE SODIUM 40 MG IV SOLR
40.0000 mg | Freq: Every day | INTRAVENOUS | Status: DC
  Administered 2020-03-29: 40 mg via INTRAVENOUS
  Filled 2020-03-29: qty 40

## 2020-03-29 NOTE — Progress Notes (Signed)
PROGRESS NOTE                                                                                                                                                                                                             Patient Demographics:    Sheila Bowman, is a 69 y.o. female, DOB - 12-31-1951, HFW:[id number]  Admit date - 03/19/2020   Admitting Physician Lorretta Harp, MD  Outpatient Primary MD for the patient is [facility], [facility]  LOS - 3  Outpatient Specialists: None  Chief Complaint  Patient presents with  . 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  She was observed in the ED since 3/20 due to altered mental status and hallucination with suspicion for Wernicke's Korsakoff syndrome.  Received folic acid and thiamine in the ED with some improvement but again became worse on 3/26.  She was found to have increasing ammonia level to 144 and admitted for further management of acute metabolic encephalopathy.   Subjective:   Received several doses of Ativan yesterday.  Patient very somnolent and poorly arousable.  NG placed in last night for tube feeding.   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  Episodes of somnolence and agitation.  Received IV Ativan yesterday and has been quite somnolent.  Started on high-dose IV thiamine 500 mg every 24 hours for 3 days (completes today).  No clinical improvement..  Gentle hydration. Continue folic acid as tolerated.   DC CIWA.  Monitor electrolytes.  High risk for refeeding.  Monitor phosphorus closely.  We will discuss with  neurology for further recommendations.  Given poor improvement in mental status I will discuss with daughter on involving palliative care again for goals of care discussion.  Active problems Hypokalemia Replenished.  Normal magnesium.   Type 2 diabetes mellitus without complication A1c of 4.7.  Stable.  Not on home meds.  Essential hypertension Of home oral meds.  As needed hydralazine.  Normocytic anemia Stable.  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  Was discharged home with her daughter and per her she has not had alcohol since then (greater than 1 month) Patient has chronically elevated total bilirubin and now with hepatic encephalopathy.  Continue lactulose.   ?seizures  noted for tongue laceration on exam. Will d/w neurology . She had normal EEG 1 month back.  As needed Ativan for seizures only.  Plan?  Endometrial thickening Found incidentally on CT scan.  Outpatient GYN follow-up.  Goals of care Seen by palliative care last hospitalization.  I will discuss with daughter on involving them again for goals of care discussion.    Code Status : DNR  Family Communication  : Daughter involved in care  Disposition Plan: Undetermined given persistent encephalopathy.  May need further goals of care discussion with daughter if no improvement in next 1-2 days. Barriers For Discharge : Acute encephalopathy  Consults  : Neurology  Procedures  : None  DVT Prophylaxis  : SCDs  Lab Results  Component Value Date   PLT 90 (L) 03/27/2020    Antibiotics  :    Anti-infectives (From admission, onward)   Start     Dose/Rate Route Frequency Ordered Stop   03/29/20 1000  rifaximin (XIFAXAN) tablet 550 mg     550 mg Per Tube 2 times daily 03/28/20 2352 04/02/20 2159   03/28/20 2200  rifaximin (XIFAXAN) tablet 550 mg  Status:  Discontinued     550 mg Oral 2 times daily 03/28/20 2017 03/28/20 2352    03/26/20 1400  cefTRIAXone (ROCEPHIN) 1 g in sodium chloride 0.9 % 100 mL IVPB  Status:  Discontinued     1 g 200 mL/hr over 30 Minutes Intravenous Every 24 hours 03/26/20 1304 03/27/20 1335        Objective:   Vitals:   03/28/20 1924 03/29/20 0348 03/29/20 0744 03/29/20 1132  BP: (!) 151/84 (!) 120/55 (!) 108/44 (!) 135/58  Pulse: 97 93 82 91  Resp:   19   Temp: 98.6 F (37 C) 98.9 F (37.2 C) 98.2 F (36.8 C) 98.3 F (36.8 C)  TempSrc: Oral Oral Oral Oral  SpO2: 100% 92% 93% 96%  Weight:  62.6 kg    Height:        Wt Readings from Last 3 Encounters:  03/29/20 62.6 kg  03/10/20 65.3 kg  02/23/20 53.9 kg     Intake/Output Summary (Last 24 hours) at 03/29/2020 1337 Last data filed at 03/29/2020 0350 Gross per 24 hour  Intake 900 ml  Output 700 ml  Net 200 ml    Physical exam Somnolent, arousable to commands, poorly communicative HEENT: NG in place moist mucosa Chest: Clear CVS: Normal S1-S2 GI: Soft, nondistended, nontender Musculoskeletal: Warm, no edema CNS: Somnolent, awake to command     Data Review:    CBC Recent Labs  Lab 03/26/20 1139 03/27/20 0536 03/27/20 2241  WBC 6.3 7.2 9.6  HGB 9.8* 10.4* 10.4*  HCT 30.5* 32.1* 32.9*  PLT 72* 89* 90*  MCV 96.8 97.3 97.9  MCH 31.1 31.5 31.0  MCHC 32.1 32.4 31.6  RDW 15.7* 15.5 15.8*    Chemistries  Recent Labs  Lab 03/26/20 1139 03/26/20 1139 03/27/20 0536 03/27/20 0536 03/27/20 2241 03/28/20 0553 03/28/20 1130 03/28/20 1709 03/28/20 2222 03/29/20 0548 03/29/20 1232  NA 141   < > 142   < > 140   < > 140 140 139 138 139  K 4.3   < > 3.9   < > 3.5   < >  3.3* 4.0 4.0 3.7 3.9  CL 112*   < > 116*   < > 110   < > 106 107 108 108 108  CO2 22   < > 20*   < > 17*   < > GLUCOSE 142*   < > 117*   < > 151*   < > 153* 117* 121* 153* 120*  BUN 15   < > 14   < > 17   < > CREATININE 0.65   < > 0.63   < > 0.57   < > 0.56 0.54 0.57 0.68 0.55  CALCIUM 9.1   < > 9.0   < >  9.0   < > 8.5* 8.3* 8.3* 8.3* 8.3*  MG  --   --   --   --  1.7  --   --  2.1 1.9 1.9  --   AST 32  --  40  --  48*  --   --   --   --   --   --   ALT 19  --  24  --  24  --   --   --   --   --   --   ALKPHOS 67  --  68  --  71  --   --   --   --   --   --   BILITOT 2.8*  --  5.0*  --  6.5*  --   --   --   --   --   --    < > = values in this interval not displayed.   ------------------------------------------------------------------------------------------------------------------ No results for input(s): CHOL, HDL, LDLCALC, TRIG, CHOLHDL, LDLDIRECT in the last 72 hours.  Lab Results  Component Value Date   HGBA1C 4.7 (L) 02/18/2020   ------------------------------------------------------------------------------------------------------------------ No results for input(s): TSH, T4TOTAL, T3FREE, THYROIDAB in the last 72 hours.  Invalid input(s): FREET3 ------------------------------------------------------------------------------------------------------------------ No results for input(s): VITAMINB12, FOLATE, FERRITIN, TIBC, IRON, RETICCTPCT in the last 72 hours.  Coagulation profile No results for input(s): INR, PROTIME in the last 168 hours.  No results for input(s): DDIMER in the last 72 hours.  Cardiac Enzymes No results for input(s): CKMB, TROPONINI, MYOGLOBIN in the last 168 hours.  Invalid input(s): CK ------------------------------------------------------------------------------------------------------------------ No results found for: BNP  Inpatient Medications  Scheduled Meds: . chlorhexidine  15 mL Mouth Rinse BID  . Chlorhexidine Gluconate Cloth  6 each Topical Daily  . folic acid  1 mg Intravenous Daily  . free water  150 mL Per Tube Q4H  . lactulose  30 g Per Tube BID  . mouth rinse  15 mL Mouth Rinse QID  . pantoprazole (PROTONIX) IV  40 mg Intravenous q1800  . rifaximin  550 mg Per Tube BID   Continuous Infusions: . sodium chloride 75 mL/hr at 03/28/20 1258   . feeding supplement (JEVITY 1.5 CAL/FIBER) 1,000 mL (03/29/20 1111)   PRN Meds:.hydrALAZINE, LORazepam, ondansetron (ZOFRAN) IV  Micro Results Recent Results (from the past 240 hour(s))  Urine Culture     Status: Abnormal   Collection Time: 03/20/20 12:00 AM   Specimen: Urine, Random  Result Value Ref Musselman Status   Specimen Description   Final    URINE, RANDOM Performed at [facility], [address], [place], [place] [address]    Special Requests   Final    NONE Performed at [facility],  [place], [place] [address]    Culture MULTIPLE SPECIES PRESENT, SUGGEST RECOLLECTION (A)  Final   Report Status 03/21/2020 FINAL  Final  Respiratory Panel by RT PCR (Flu A&B, Covid) - Nasopharyngeal Swab     Status: None   Collection Time: 03/21/20 10:37 AM   Specimen: Nasopharyngeal Swab  Result Value Ref W[place]velt Status   SARS Coronavirus 2 by RT PCR NEGATIVE NEGATIVE Final    Comment: (NOTE) SARS-CoV-2 target nucleic acids are NOT DETECTED. The SARS-CoV-2 RNA is generally detectable in upper respiratoy specimens during the acute phase of infection. The lowest concentration of SARS-CoV-2 viral copies this assay can detect is 131 copies/mL. A negative result does not preclude SARS-Cov-2 infection and should not be used as the sole basis for treatment or other patient management decisions. A negative result may occur with  improper specimen collection/handling, submission of specimen other than nasopharyngeal swab, presence of viral mutation(s) within the areas targeted by this assay, and inadequate number of viral copies (<131 copies/mL). A negative result must be combined with clinical observations, patient history, and epidemiological information. The expected result is Negative. Fact Sheet for Patients:  PinkCheek.be Fact Sheet for Healthcare Providers:  GravelBags.it This test is not yet ap  proved or cleared by the [place] FDA and  has been authorized for detection and/or diagnosis of SARS-CoV-2 by FDA under an Emergency Use Authorization (EUA). This EUA will remain  in effect (meaning this test can be used) for the duration of the COVID-19 declaration under Section 564(b)(1) of the Act, 21 U.S.C. section 360bbb-3(b)(1), unless the authorization is terminated or revoked sooner.    Influenza A by PCR NEGATIVE NEGATIVE Final   Influenza B by PCR NEGATIVE NEGATIVE Final    Comment: (NOTE) The Xpert Xpress SARS-CoV-2/FLU/RSV assay is intended as an aid in  the diagnosis of influenza from Nasopharyngeal swab specimens and  should not be used as a sole basis for treatment. Nasal washings and  aspirates are unacceptable for Xpert Xpress SARS-CoV-2/FLU/RSV  testing. Fact Sheet for Patients: PinkCheek.be Fact Sheet for Healthcare Providers: GravelBags.it This test is not yet approved or cleared by the [place] FDA and  has been authorized for detection and/or diagnosis of SARS-CoV-2 by  FDA under an Emergency Use Authorization (EUA). This EUA will remain  in effect (meaning this test can be used) for the duration of the  Covid-19 declaration under Section 564(b)(1) of the Act, 21  U.S.C. section 360bbb-3(b)(1), unless the authorization is  terminated or revoked. Performed at [facility], [address], [place], [place] [address]   Urine Culture     Status: None   Collection Time: 03/26/20 11:39 AM   Specimen: Urine, Catheterized  Result Value Ref Burkle Status   Specimen Description   Final    URINE, CATHETERIZED Performed at [facility], [address] [place], [place] [address]    Special Requests   Final    NONE Performed at [facility], [address], [place], [place] [address]    Culture   Final    NO GROWTH Performed at [facility], [place] [address],  [place], [place] [address]    Report Status 03/27/2020 FINAL  Final  SARS CORONAVIRUS 2 (TAT 6-24 HRS) Nasopharyngeal Nasopharyngeal Swab     Status: None   Collection Time: 03/26/20 12:03 PM   Specimen: Nasopharyngeal Swab  Result Value Ref Stgermaine Status   SARS Coronavirus 2 NEGATIVE NEGATIVE Final  Comment: (NOTE) SARS-CoV-2 target nucleic acids are NOT DETECTED. The SARS-CoV-2 RNA is generally detectable in upper and lower respiratory specimens during the acute phase of infection. Negative results do not preclude SARS-CoV-2 infection, do not rule out co-infections with other pathogens, and should not be used as the sole basis for treatment or other patient management decisions. Negative results must be combined with clinical observations, patient history, and epidemiological information. The expected result is Negative. Fact Sheet for Patients: HairSlick.no Fact Sheet for Healthcare Providers: quierodirigir.com This test is not yet approved or cleared by the [place] FDA and  has been authorized for detection and/or diagnosis of SARS-CoV-2 by FDA under an Emergency Use Authorization (EUA). This EUA will remain  in effect (meaning this test can be used) for the duration of the COVID-19 declaration under Section 56 4(b)(1) of the Act, 21 U.S.C. section 360bbb-3(b)(1), unless the authorization is terminated or revoked sooner. Performed at [facility], [address], [place], [place] [address]   CULTURE, BLOOD (ROUTINE X 2) w Reflex to ID Panel     Status: None (Preliminary result)   Collection Time: 03/26/20  1:23 PM   Specimen: BLOOD  Result Value Ref Mor[place] Status   Specimen Description BLOOD LEFT ANTECUBITAL  Final   Special Requests   Final    BOTTLES DRAWN AEROBIC AND ANAEROBIC Blood Culture adequate volume   Culture   Final    NO GROWTH 3 DAYS Performed at [facility], [address]  [place], [place] [address]    Report Status PENDING  Incomplete  CULTURE, BLOOD (ROUTINE X 2) w Reflex to ID Panel     Status: None (Preliminary result)   Collection Time: 03/26/20  1:23 PM   Specimen: BLOOD  Result Value Ref [place] Status   Specimen Description BLOOD BLOOD LEFT HAND  Final   Special Requests   Final    BOTTLES DRAWN AEROBIC AND ANAEROBIC Blood Culture adequate volume   Culture   Final    NO GROWTH 3 DAYS Performed at [facility], [address] [place], [place] [address]    Report Status PENDING  Incomplete  CULTURE, BLOOD (ROUTINE X 2) w Reflex to ID Panel     Status: None (Preliminary result)   Collection Time: 03/28/20  2:44 AM   Specimen: BLOOD  Result Value Ref Bonzo Status   Specimen Description BLOOD LEFT HAND  Final   Special Requests   Final    BOTTLES DRAWN AEROBIC AND ANAEROBIC Blood Culture adequate volume   Culture   Final    NO GROWTH 1 DAY Performed at [facility], [address], [place], [place] [address]    Report Status PENDING  Incomplete  CULTURE, BLOOD (ROUTINE X 2) w Reflex to ID Panel     Status: None (Preliminary result)   Collection Time: 03/28/20  2:56 AM   Specimen: BLOOD  Result Value Ref Amorin Status   Specimen Description BLOOD RIGHT AC  Final   Special Requests   Final    BOTTLES DRAWN AEROBIC AND ANAEROBIC Blood Culture results may not be optimal due to an inadequate volume of blood received in culture bottles   Culture   Final    NO GROWTH 1 DAY Performed at [facility], [address], [place], [place] [address]    Report Status PENDING  Incomplete    Radiology Reports DG Chest 1 View  Result Date: 03/28/2020 CLINICAL DATA:  Check nasogastric catheter placement  EXAM: CHEST  1 VIEW COMPARISON:  03/28/2019 FINDINGS: Cardiac shadow is stable. Gastric catheter is noted extending into the stomach and subsequently into the second portion of the duodenum. Patchy atelectatic changes are noted in the  bases bilaterally. IMPRESSION: Bibasilar atelectasis increased from the prior exam. Gastric catheter within the duodenum. Electronically Signed   By: Alcide Clever M.D.   On: 03/28/2020 23:00   DG Chest 1 View  Result Date: 03/27/2020 CLINICAL DATA:  Cough EXAM: CHEST  1 VIEW COMPARISON:  March 24, 2020 FINDINGS: The heart size and mediastinal contours are within normal limits. Both lungs are clear. The visualized skeletal structures are unremarkable. IMPRESSION: No active disease. Electronically Signed   By: Katherine Mantle M.D.   On: 03/27/2020 22:13   DG Chest 2 View  Addendum Date: 03/25/2020   ADDENDUM REPORT: 03/25/2020 10:15 ADDENDUM: No evidence to suggest active TB infection. No changes to the Findings section of the report. Electronically Signed   By: Duanne Guess D.O.   On: 03/25/2020 10:15   Result Date: 03/25/2020 CLINICAL DATA:  Smoking history, hypertension EXAM: CHEST - 2 VIEW COMPARISON:  03/19/2020 FINDINGS: The heart size and mediastinal contours are stable. Atherosclerotic calcification of the aortic knob. No focal airspace consolidation, pleural effusion, or pneumothorax. The visualized skeletal structures are unremarkable. IMPRESSION: No active cardiopulmonary disease. Electronically Signed: By: Duanne Guess D.O. On: 03/24/2020 16:39   CT Head Wo Contrast  Result Date: 03/19/2020 CLINICAL DATA:  Altered mental status, confusion, recent ETOH detox EXAM: CT HEAD WITHOUT CONTRAST TECHNIQUE: Contiguous axial images were obtained from the base of the skull through the vertex without intravenous contrast. COMPARISON:  CT head 02/18/2020 FINDINGS: Brain: No evidence of acute infarction, hemorrhage, hydrocephalus, extra-axial collection or mass lesion/mass effect. Symmetric prominence of the ventricles, cisterns and sulci compatible with parenchymal volume loss. Patchy areas of white matter hypoattenuation are most compatible with chronic microvascular angiopathy. Mild  hyperattenuation of the globus pallidi may reflect senescent mineralization similar to prior exams. Vascular: Atherosclerotic calcification of the carotid siphons. No hyperdense vessel. Skull: Motion at the level of the skull base. No visible calvarial fracture or suspicious osseous lesion. No scalp swelling or hematoma. Sinuses/Orbits: Paranasal sinuses and mastoid air cells are predominantly clear. Included orbital structures are unremarkable. Other: Likely chronic deformity of the nasal bones. IMPRESSION: No acute intracranial abnormality. Mild chronic microvascular angiopathy and parenchymal volume loss similar to comparison. Motion degradation may limit evaluation of the skull base and posterior fossa. Electronically Signed   By: Kreg Shropshire M.D.   On: 03/19/2020 23:40   CT ABDOMEN PELVIS W CONTRAST  Result Date: 03/20/2020 CLINICAL DATA:  Abdominal distension, possibly Wernicke-Korsakoff syndrome EXAM: CT ABDOMEN AND PELVIS WITH CONTRAST TECHNIQUE: Multidetector CT imaging of the abdomen and pelvis was performed using the standard protocol following bolus administration of intravenous contrast. CONTRAST:  28mL OMNIPAQUE IOHEXOL 300 MG/ML  SOLN COMPARISON:  None. FINDINGS: Lower chest: Atelectatic changes are present in the lung bases. No consolidation or effusion. Cardiac size is upper limits normal. Extensive coronary artery calcifications are present. No pericardial effusion. Hepatobiliary: Slightly nodular hepatic surface contour difficult to fully assess given patient respiratory motion artifact. No focal liver lesion. No visible calcified gallstones or biliary ductal dilatation. Pancreas: Unremarkable. No pancreatic ductal dilatation or surrounding inflammatory changes. Spleen: Normal in size without focal abnormality. Adrenals/Urinary Tract: Normal adrenal glands. Questionable cyst arising from the upper pole left kidney though difficult to fully assess given extensive respiratory motion artifact.  No  other focal renal lesions. Mild bilateral symmetric perinephric stranding, a nonspecific finding though may correlate with either age or decreased renal function. No urolithiasis or hydronephrosis. Urinary bladder is unremarkable. Stomach/Bowel: Mild edematous mural thickening of the stomach and duodenum. No small bowel dilatation. The appendix is poorly visualized. Diffuse mild colonic mural thickening more pronounced towards the descending and sigmoid colon. Scattered colonic diverticula without focal diverticular inflammation to suggest acute diverticulitis. Vascular/Lymphatic: Atherosclerotic plaque within the normal caliber aorta. Some congested mid mesenteric nodes are noted. No pathologically enlarged nodes in the abdomen or pelvis. Reproductive: Slightly retroverted uterus with some questionable endometrial thickening measuring up to 11 mm. No concerning adnexal lesions are seen. Other: Circumferential mild body wall edema as well as edematous changes in the central mesentery. Small volume ascites noted in the right subdiaphragmatic space. No bowel containing hernias. Small fat containing left inguinal hernia. Musculoskeletal: Multilevel degenerative changes are present in the imaged portions of the spine. No acute osseous abnormality or suspicious osseous lesion. IMPRESSION: 1. Nodular hepatic surface contour, such finding may be seen in the setting of intrinsic liver disease/cirrhosis. 2. Diffuse edematous changes of the stomach, colon and small bowel. While these could feasibly be related to an enterocolitis, finding would be more nonspecific in the setting intrinsic liver as portal Lorie Apley neuropathy/colopathy could have a similar appearance. 3. Small volume ascites in the right subdiaphragmatic space. Could be related to possible liver disease or anasarca given circumferential body wall edema and mesenteric edema. 4. Questionable endometrial thickening measuring up to 11 mm. Recommend nonemergent,  outpatient pelvic ultrasound for further evaluation. 5. Extensive coronary artery calcifications. 6. Small fat containing left inguinal hernia. 7. Aortic Atherosclerosis (ICD10-I70.0). Electronically Signed   By: Kreg Shropshire M.D.   On: 03/20/2020 01:26   DG Chest Port 1 View  Result Date: 03/19/2020 CLINICAL DATA:  Altered mental status EXAM: PORTABLE CHEST 1 VIEW COMPARISON:  None. FINDINGS: Low lung volumes with streaky opacities favoring atelectasis. No pneumothorax or effusion. No convincing features of edema. Slight prominence of the cardiac silhouette possibly related to portable technique. The aorta is calcified. The remaining cardiomediastinal contours are unremarkable. No acute osseous or soft tissue abnormality. Degenerative changes are present in the imaged spine and shoulders. Telemetry leads overlie the chest. IMPRESSION: Streaky basilar opacities favor atelectasis in the setting of low lung volumes. Aortic Atherosclerosis (ICD10-I70.0). Electronically Signed   By: Kreg Shropshire M.D.   On: 03/19/2020 22:38    Time Spent in minutes 35   Delilah Mulgrew M.D on 03/29/2020 at 1:37 PM  Between 7am to 7pm - Pager - [phone]  After 7pm go to [email or web address] - password [facility]  [facility] -  Office  [phone]

## 2020-03-29 NOTE — Care Management Important Message (Signed)
Important Message  Patient Details  Name: Genasis Zingale MRN: [id number] Date of Birth: 03/31/1951   Medicare Important Message Given:  Yes     Bernadette Hoit 03/29/2020, 10:20 AM

## 2020-03-29 NOTE — Progress Notes (Signed)
Subjective: Patient remains altered on tube feedings  Objective: Current vital signs: BP (!) 179/72 (BP Location: Right Arm)   Pulse 97   Temp 99 F (37.2 C) (Oral)   Resp 19   Ht 5' 5.51" (1.664 m)   Wt 62.6 kg   SpO2 96%   BMI 22.62 kg/m  Vital signs in last 24 hours: Temp:  [98.2 F (36.8 C)-99 F (37.2 C)] 99 F (37.2 C) (03/30 1628) Pulse Rate:  [82-97] 97 (03/30 1628) Resp:  [19] 19 (03/30 0744) BP: (108-179)/(44-84) 179/72 (03/30 1628) SpO2:  [92 %-100 %] 96 % (03/30 1628) Weight:  [62.6 kg] 62.6 kg (03/30 0348)  Intake/Output from previous day: 03/29 0701 - 03/30 0700 In: 900 [I.V.:900] Out: 1055 [Urine:1055] Intake/Output this shift: No intake/output data recorded. Nutritional status:  Diet Order            Diet NPO time specified  Diet effective now              Neurologic Exam: Mental Status: Lethargic.  Eyes closed.  Able to tell me her name and age.  Does not follow commands. Cranial Nerves: II: Blinks to bilateral confrontation, pupils equal, round, reactive to light and accommodation III,IV, VI: Intact oculocephalic maneuvers V,VII: face symmetric VIII: hearing normal bilaterally IX,X: unable to test XI: unable to test XII: unable to test Motor: Moves all extremities against gravity with no focal weakness appreciated Sensory: Responds to light noxious stimuli throughout  Lab Results: Basic Metabolic Panel: Recent Labs  Lab 03/27/20 2241 03/28/20 0553 03/28/20 1130 03/28/20 1130 03/28/20 1709 03/28/20 1709 03/28/20 2222 03/29/20 0548 03/29/20 1232  NA 140   < > 140  --  140  --  139 138 139  K 3.5   < > 3.3*  --  4.0  --  4.0 3.7 3.9  CL 110   < > 106  --  107  --  108 108 108  CO2 17*   < > 26  --  25  --  24 25 24   GLUCOSE 151*   < > 153*  --  117*  --  121* 153* 120*  BUN 17   < > 15  --  15  --  16 18 18   CREATININE 0.57   < > 0.56  --  0.54  --  0.57 0.68 0.55  CALCIUM 9.0   < > 8.5*   < > 8.3*   < > 8.3* 8.3* 8.3*  MG  1.7  --   --   --  2.1  --  1.9 1.9  --   PHOS  --   --   --   --   --   --  3.2 3.1  --    < > = values in this interval not displayed.    Liver Function Tests: Recent Labs  Lab 03/26/20 1139 03/27/20 0536 03/27/20 2241  AST 32 40 48*  ALT 19 24 24   ALKPHOS 67 68 71  BILITOT 2.8* 5.0* 6.5*  PROT 6.2* 6.6 6.6  ALBUMIN 3.0* 3.4* 3.2*   No results for input(s): LIPASE, AMYLASE in the last 168 hours. Recent Labs  Lab 03/27/20 2241 03/28/20 0244 03/28/20 2222  AMMONIA 34 49* 36*    CBC: Recent Labs  Lab 03/26/20 1139 03/27/20 0536 03/27/20 2241  WBC 6.3 7.2 9.6  HGB 9.8* 10.4* 10.4*  HCT 30.5* 32.1* 32.9*  MCV 96.8 97.3 97.9  PLT 72* 89* 90*  Cardiac Enzymes: Recent Labs  Lab 03/28/20 0244  CKTOTAL 319*    Lipid Panel: No results for input(s): CHOL, TRIG, HDL, CHOLHDL, VLDL, LDLCALC in the last 168 hours.  CBG: Recent Labs  Lab 03/26/20 1115 03/27/20 1147 03/27/20 1656 03/27/20 2138 03/28/20 0734  GLUCAP 119* 101* 112* 136* 133*    Microbiology: Results for orders placed or performed during the hospital encounter of 03/19/20  Urine Culture     Status: Abnormal   Collection Time: 03/20/20 12:00 AM   Specimen: Urine, Random  Result Value Ref Martinezgarcia Status   Specimen Description   Final    URINE, RANDOM Performed at [facility], [address] [place], [place] [address]    Special Requests   Final    NONE Performed at [facility], [address] [place], [place] [address]    Culture MULTIPLE SPECIES PRESENT, SUGGEST RECOLLECTION (A)  Final   Report Status 03/21/2020 FINAL  Final  Respiratory Panel by RT PCR (Flu A&B, Covid) - Nasopharyngeal Swab     Status: None   Collection Time: 03/21/20 10:37 AM   Specimen: Nasopharyngeal Swab  Result Value Ref Paxson Status   SARS Coronavirus 2 by RT PCR NEGATIVE NEGATIVE Final    Comment: (NOTE) SARS-CoV-2 target nucleic acids are NOT DETECTED. The SARS-CoV-2 RNA is generally  detectable in upper respiratoy specimens during the acute phase of infection. The lowest concentration of SARS-CoV-2 viral copies this assay can detect is 131 copies/mL. A negative result does not preclude SARS-Cov-2 infection and should not be used as the sole basis for treatment or other patient management decisions. A negative result may occur with  improper specimen collection/handling, submission of specimen other than nasopharyngeal swab, presence of viral mutation(s) within the areas targeted by this assay, and inadequate number of viral copies (<131 copies/mL). A negative result must be combined with clinical observations, patient history, and epidemiological information. The expected result is Negative. Fact Sheet for Patients:  https://www.moore.com/ Fact Sheet for Healthcare Providers:  https://www.young.biz/ This test is not yet ap proved or cleared by the [place] FDA and  has been authorized for detection and/or diagnosis of SARS-CoV-2 by FDA under an Emergency Use Authorization (EUA). This EUA will remain  in effect (meaning this test can be used) for the duration of the COVID-19 declaration under Section 564(b)(1) of the Act, 21 U.S.C. section 360bbb-3(b)(1), unless the authorization is terminated or revoked sooner.    Influenza A by PCR NEGATIVE NEGATIVE Final   Influenza B by PCR NEGATIVE NEGATIVE Final    Comment: (NOTE) The Xpert Xpress SARS-CoV-2/FLU/RSV assay is intended as an aid in  the diagnosis of influenza from Nasopharyngeal swab specimens and  should not be used as a sole basis for treatment. Nasal washings and  aspirates are unacceptable for Xpert Xpress SARS-CoV-2/FLU/RSV  testing. Fact Sheet for Patients: https://www.moore.com/ Fact Sheet for Healthcare Providers: https://www.young.biz/ This test is not yet approved or cleared by the [place] FDA and  has been  authorized for detection and/or diagnosis of SARS-CoV-2 by  FDA under an Emergency Use Authorization (EUA). This EUA will remain  in effect (meaning this test can be used) for the duration of the  Covid-19 declaration under Section 564(b)(1) of the Act, 21  U.S.C. section 360bbb-3(b)(1), unless the authorization is  terminated or revoked. Performed at [facility], [address] [place], [place] [address]   Urine Culture     Status: None   Collection Time: 03/26/20 11:39  AM   Specimen: Urine, Catheterized  Result Value Ref Campoli Status   Specimen Description   Final    URINE, CATHETERIZED Performed at [facility], [address], [place], [place] [address]    Special Requests   Final    NONE Performed at [facility], [address] [place], [place] [address]    Culture   Final    NO GROWTH Performed at [facility], [address], [place], [place] [address]    Report Status 03/27/2020 FINAL  Final  SARS CORONAVIRUS 2 (TAT 6-24 HRS) Nasopharyngeal Nasopharyngeal Swab     Status: None   Collection Time: 03/26/20 12:03 PM   Specimen: Nasopharyngeal Swab  Result Value Ref Bohannon Status   SARS Coronavirus 2 NEGATIVE NEGATIVE Final    Comment: (NOTE) SARS-CoV-2 target nucleic acids are NOT DETECTED. The SARS-CoV-2 RNA is generally detectable in upper and lower respiratory specimens during the acute phase of infection. Negative results do not preclude SARS-CoV-2 infection, do not rule out co-infections with other pathogens, and should not be used as the sole basis for treatment or other patient management decisions. Negative results must be combined with clinical observations, patient history, and epidemiological information. The expected result is Negative. Fact Sheet for Patients: HairSlick.no Fact Sheet for Healthcare Providers: quierodirigir.com This test is not yet approved or  cleared by the [place] FDA and  has been authorized for detection and/or diagnosis of SARS-CoV-2 by FDA under an Emergency Use Authorization (EUA). This EUA will remain  in effect (meaning this test can be used) for the duration of the COVID-19 declaration under Section 56 4(b)(1) of the Act, 21 U.S.C. section 360bbb-3(b)(1), unless the authorization is terminated or revoked sooner. Performed at [facility], [address] [place], [place] [address]   CULTURE, BLOOD (ROUTINE X 2) w Reflex to ID Panel     Status: None (Preliminary result)   Collection Time: 03/26/20  1:23 PM   Specimen: BLOOD  Result Value Ref Spackman Status   Specimen Description BLOOD LEFT ANTECUBITAL  Final   Special Requests   Final    BOTTLES DRAWN AEROBIC AND ANAEROBIC Blood Culture adequate volume   Culture   Final    NO GROWTH 3 DAYS Performed at [facility], [address] [place], [place] [address]    Report Status PENDING  Incomplete  CULTURE, BLOOD (ROUTINE X 2) w Reflex to ID Panel     Status: None (Preliminary result)   Collection Time: 03/26/20  1:23 PM   Specimen: BLOOD  Result Value Ref Bures Status   Specimen Description BLOOD BLOOD LEFT HAND  Final   Special Requests   Final    BOTTLES DRAWN AEROBIC AND ANAEROBIC Blood Culture adequate volume   Culture   Final    NO GROWTH 3 DAYS Performed at [facility], [address] [place], [place] [address]    Report Status PENDING  Incomplete  CULTURE, BLOOD (ROUTINE X 2) w Reflex to ID Panel     Status: None (Preliminary result)   Collection Time: 03/28/20  2:44 AM   Specimen: BLOOD  Result Value Ref Mumford Status   Specimen Description BLOOD LEFT HAND  Final   Special Requests   Final    BOTTLES DRAWN AEROBIC AND ANAEROBIC Blood Culture adequate volume   Culture   Final    NO GROWTH 1 DAY Performed at [facility], [address], [place], [place] [address]  Report Status PENDING  Incomplete   CULTURE, BLOOD (ROUTINE X 2) w Reflex to ID Panel     Status: None (Preliminary result)   Collection Time: 03/28/20  2:56 AM   Specimen: BLOOD  Result Value Ref Fedorko Status   Specimen Description BLOOD RIGHT AC  Final   Special Requests   Final    BOTTLES DRAWN AEROBIC AND ANAEROBIC Blood Culture results may not be optimal due to an inadequate volume of blood received in culture bottles   Culture   Final    NO GROWTH 1 DAY Performed at [facility], [address] [place], [place] [address]    Report Status PENDING  Incomplete    Coagulation Studies: No results for input(s): LABPROT, INR in the last 72 hours.  Imaging: DG Chest 1 View  Result Date: 03/28/2020 CLINICAL DATA:  Check nasogastric catheter placement EXAM: CHEST  1 VIEW COMPARISON:  03/28/2019 FINDINGS: Cardiac shadow is stable. Gastric catheter is noted extending into the stomach and subsequently into the second portion of the duodenum. Patchy atelectatic changes are noted in the bases bilaterally. IMPRESSION: Bibasilar atelectasis increased from the prior exam. Gastric catheter within the duodenum. Electronically Signed   By: Alcide Clever M.D.   On: 03/28/2020 23:00   DG Chest 1 View  Result Date: 03/27/2020 CLINICAL DATA:  Cough EXAM: CHEST  1 VIEW COMPARISON:  March 24, 2020 FINDINGS: The heart size and mediastinal contours are within normal limits. Both lungs are clear. The visualized skeletal structures are unremarkable. IMPRESSION: No active disease. Electronically Signed   By: Katherine Mantle M.D.   On: 03/27/2020 22:13    Medications:  I have reviewed the patient's current medications. Scheduled: . chlorhexidine  15 mL Mouth Rinse BID  . Chlorhexidine Gluconate Cloth  6 each Topical Daily  . folic acid  1 mg Intravenous Daily  . free water  150 mL Per Tube Q4H  . lactulose  30 g Per Tube BID  . mouth rinse  15 mL Mouth Rinse QID  . [START ON 03/30/2020] pantoprazole sodium  40 mg Per Tube Daily   . rifaximin  550 mg Per Tube BID    Assessment/Plan: 69 y.o. 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 Has had previous admissions for the same.  IV thiamine initiated but patient has not had a response.  Thiamine level form last hospitalization in February was normal.  Ammonia initially elevated but now closer to normal levels.  Encephalopathy related to ETOH abuse remains likely underlying diagnosis.  This hospitalization also complicated by markedly elevated ammonia level.  Although levels much improved it may take a longer period of time for her mental status to improve.    Recommendations: 1. Continue thiamine and folate po 2. Last Ativan dose was yesterday.  Would refrain from using sedating agents as much as possible since patient will take a prolonged period of time to clear.      LOS: 3 days   Thana Farr, MD Neurology [phone] 03/29/2020  4:38 PM

## 2020-03-29 NOTE — Progress Notes (Signed)
Initial Nutrition Assessment  DOCUMENTATION CODES:   Not applicable  INTERVENTION:  Initiate Jevity 1.5 Cal at 20 mL/hr and advance by 15 mL/hr every 8 hours to goal rate of 50 mL/hr (1200 mL goal daily volume). Provides 1800 kcal, 77 grams of protein, 912 mL H2O daily.  Provide free water flush of 150 mL Q4hrs. Provides a total of 1812 mL H2O daily including water in tube feeding regimen.  Goal TF regimen meets 100% RDIs for vitamins/minerals.  Monitor magnesium, potassium, and phosphorus daily for at least 3 days, MD to replete as needed, as pt is at risk for refeeding syndrome.  NUTRITION DIAGNOSIS:   Inadequate oral intake related to inability to eat, lethargy/confusion as evidenced by NPO status.  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   Assessed patient at bedside. She was lethargic and unable to provide any history. No family members at bedside. Patient had a sump-style nasoduodenal tube in place and she was receiving tube feeds per protocol that was entered last night. Patient has been NPO for 3/27 in setting of lethargy and unsafe for PO intake. Per review of chart if appears NP had discussion with patient's daughter regarding goals of care and daughter decided to have NGT placed overnight. Patient is currently receiving a free water flush of 50 mL Q4hrs.  Per review of weight history in chart patient was 62.1 kg on 06/09/2019. She is now 62.6 kg (138.1 lbs).  Medications reviewed and include: folic acid 1 mg daily IV, lactulose 30 grams BID per tube, pantoprazole, NS at 75 mL/hr.  Labs reviewed.  Enteral Access: naso  duodenal tube placed overnight; terminates in the second portion of the duodenum per chest x-ray 3/29  Patient does not meet criteria for malnutrition at this time but is at risk for development of malnutrition.  Discussed with RN.  NUTRITION - FOCUSED PHYSICAL EXAM:    Most Recent Value  Orbital Region  No depletion  Upper Arm Region  Mild depletion  Thoracic and Lumbar Region  No depletion  Buccal Region  No depletion  Temple Region  Mild depletion  Clavicle Bone Region  No depletion  Clavicle and Acromion Bone Region  Mild depletion  Scapular Bone Region  Unable to assess  Dorsal Hand  Unable to assess  Patellar Region  Moderate depletion  Anterior Thigh Region  Moderate depletion  Posterior Calf Region  Moderate depletion  Edema (RD Assessment)  None  Hair  Reviewed  Eyes  Unable to assess  Mouth  Unable to assess  Skin  Reviewed  Nails  Unable to assess     Diet Order:   Diet Order            Diet NPO time specified  Diet effective now             EDUCATION NEEDS:   No education needs have been identified at this time  Skin:  Skin Assessment: Reviewed RN Assessment(ecchymosis)  Last BM:  03/27/2020 large type 7  Height:   Ht Readings from Last 1 Encounters:  03/28/20 5' 5.51" (1.664 m)   Weight:   Wt Readings from Last 1 Encounters:  03/29/20 62.6 kg   Ideal Body Weight:  58 kg  BMI:  Body mass index is 22.62 kg/m.  Estimated Nutritional Needs:   Kcal:  1600-1800  Protein:  75-85 grams  Fluid:  1.8-2 L/day  Jacklynn Barnacle, MS, RD, LDN Pager number available on Amion

## 2020-03-29 NOTE — Plan of Care (Signed)
  Problem: Education: Goal: Knowledge of General Education information will improve Description: Including pain rating scale, medication(s)/side effects and non-pharmacologic comfort measures Outcome: Not Progressing Note: Patient is overly drowsy / fatigued today. Now on continuous TF's with a Foley catheter. Will continue to monitor neurological status for the remainder of the shift. CIWA and neuro checks discontinued earlier today. Jari Favre [facility]

## 2020-03-30 ENCOUNTER — Inpatient Hospital Stay: Payer: Medicare Other

## 2020-03-30 LAB — PHOSPHORUS: Phosphorus: 3.2 mg/dL (ref 2.5–4.6)

## 2020-03-30 LAB — SARS CORONAVIRUS 2 (TAT 6-24 HRS): SARS Coronavirus 2: NEGATIVE

## 2020-03-30 LAB — GLUCOSE, CAPILLARY
Glucose-Capillary: 100 mg/dL — ABNORMAL HIGH (ref 70–99)
Glucose-Capillary: 115 mg/dL — ABNORMAL HIGH (ref 70–99)
Glucose-Capillary: 132 mg/dL — ABNORMAL HIGH (ref 70–99)
Glucose-Capillary: 146 mg/dL — ABNORMAL HIGH (ref 70–99)
Glucose-Capillary: 150 mg/dL — ABNORMAL HIGH (ref 70–99)
Glucose-Capillary: 150 mg/dL — ABNORMAL HIGH (ref 70–99)

## 2020-03-30 LAB — MAGNESIUM: Magnesium: 1.9 mg/dL (ref 1.7–2.4)

## 2020-03-30 MED ORDER — SALINE SPRAY 0.65 % NA SOLN
1.0000 | NASAL | Status: DC | PRN
  Administered 2020-03-30: 1 via NASAL
  Filled 2020-03-30: qty 44

## 2020-03-30 NOTE — Progress Notes (Signed)
Patient tolerating tube feeding/water, via NGT meeting fluid requirements. Maintenance IV fluids discontinued

## 2020-03-30 NOTE — Progress Notes (Addendum)
Intentional rounding- noted patient's NG tube looks like it is further out.   Did not see documentation regarding marker placement-  Patient constantly clearing throat.   Held Feeding and Paged MD  MD order chest x-ray   Xray confirmed tube out of place.  Will need to remove and place a new since measurements were not documented

## 2020-03-30 NOTE — Progress Notes (Signed)
Patient is currently followed by [facility] community Palliative program. Georgena Spurling made aware. Dayna Barker BSN, RN, [facility] [employer] [phone]

## 2020-03-30 NOTE — Progress Notes (Signed)
PROGRESS NOTE                                                                                                                                                                                                             Patient Demographics:    Sheila Bowman, is a 69 y.o. female, DOB - [birth year], UYQ:[id number]  Admit date - 03/19/2020   Admitting Physician Lorretta Harp, MD  Outpatient Primary MD for the patient is [facility]  LOS - 4  Outpatient Specialists: None  Chief Complaint  Patient presents with  . 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  She was observed in the ED since 3/20 due to altered mental status and hallucination with suspicion for Wernicke's Korsakoff syndrome.  Received folic acid and thiamine in the ED with some improvement but again became worse on 3/26.  She was found to have increasing ammonia level to 144 and admitted for further management of acute metabolic encephalopathy.   Subjective:   Patient more arousable today but unable to communicate and has incoherent speech.  Tolerating NG feeds.  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  Episodes of somnolence and agitation.  Has been off IV Ativan and being monitored for improvement in her mental status. Received 3 days of high-dose IV thiamine (completed on 3/30). Monitor electrolytes including phosphorus given high risk for refeeding syndrome. Neurology consult appreciated.  If no clinical improvement in the next 24 hours will consult palliative care for further goals of care discussion  (PEG tube placement, disposition and possible hospice versus discharge to memory care based on any improvement).  Active problems Hypokalemia Replenished.  Normal magnesium.   Type 2 diabetes mellitus without complication A1c of 4.7.  Stable.  Not on home meds.  Essential hypertension Off home oral meds.  As needed hydralazine.  Normocytic anemia Stable.  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  Was discharged home with her daughter and per her she has not had alcohol since then (greater than  1 month) Patient has chronically elevated total bilirubin and now with hepatic encephalopathy.  Continue lactulose.   ?seizures  noted for tongue laceration on exam.  She had normal EEG 1 month back.  As needed Ativan for seizures only.  ?  Endometrial thickening Found incidentally on CT scan.  Outpatient GYN follow-up.  Goals of care Seen by palliative care last hospitalization.  Discussed with daughter on 3/30.  If no improvement in the next 24 hours we will consult palliative care again for further GOC discussion.    Code Status : DNR  Family Communication  : Daughter involved in care  Disposition Plan: Discharge to memory care versus hospice based on clinical course in the next 24-48 hours. Barriers For Discharge : Persistent encephalopathy.  Consults  : Neurology  Procedures  : None  DVT Prophylaxis  : SCDs  Lab Results  Component Value Date   PLT 90 (L) 03/27/2020    Antibiotics  :    Anti-infectives (From admission, onward)   Start     Dose/Rate Route Frequency Ordered Stop   03/29/20 1000  rifaximin (XIFAXAN) tablet 550 mg     550 mg Per Tube 2 times daily 03/28/20 2352 04/02/20 2159   03/28/20 2200  rifaximin (XIFAXAN) tablet 550 mg  Status:  Discontinued     550 mg Oral 2 times daily 03/28/20 2017 03/28/20 2352   03/26/20 1400  cefTRIAXone (ROCEPHIN) 1 g in sodium chloride 0.9 % 100 mL  IVPB  Status:  Discontinued     1 g 200 mL/hr over 30 Minutes Intravenous Every 24 hours 03/26/20 1304 03/27/20 1335        Objective:   Vitals:   03/30/20 0321 03/30/20 0322 03/30/20 0726 03/30/20 1140  BP: (!) 143/79  (!) 162/87 (!) 159/75  Pulse: 91  90 89  Resp: Temp: 99.8 F (37.7 C)  99.5 F (37.5 C) 98.5 F (36.9 C)  TempSrc: Oral  Oral Oral  SpO2: 99%  96% 95%  Weight:  62.2 kg    Height:        Wt Readings from Last 3 Encounters:  03/30/20 62.2 kg  03/10/20 65.3 kg  02/23/20 53.9 kg     Intake/Output Summary (Last 24 hours) at 03/30/2020 1325 Last data filed at 03/30/2020 1140 Gross per 24 hour  Intake --  Output 2025 ml  Net -2025 ml   Physical exam Somnolent, more arousable but remains poorly communicative HEENT: NG +, moist mucosa Chest: Clear bilaterally CVs: Normal S1-S2 GI: Soft, nontender, nondistended Musculoskeletal: Warm, no edema CNS: Somnolent and arousable to command, not oriented      Data Review:    CBC Recent Labs  Lab 03/26/20 1139 03/27/20 0536 03/27/20 2241  WBC 6.3 7.2 9.6  HGB 9.8* 10.4* 10.4*  HCT 30.5* 32.1* 32.9*  PLT 72* 89* 90*  MCV 96.8 97.3 97.9  MCH 31.1 31.5 31.0  MCHC 32.1 32.4 31.6  RDW 15.7* 15.5 15.8*    Chemistries  Recent Labs  Lab 03/26/20 1139 03/26/20 1139 03/27/20 0536 03/27/20 0536 03/27/20 2241 03/27/20 2241 03/28/20 0553 03/28/20 1130 03/28/20 1709 03/28/20 2222 03/29/20 0548 03/29/20 1232 03/29/20 1653 03/30/20 0427  NA 141   < > 142   < > 140  --    < > 140 140 139 138 139  --   --   K 4.3   < > 3.9   < > 3.5  --    < >  3.3* 4.0 4.0 3.7 3.9  --   --   CL 112*   < > 116*   < > 110  --    < > 106 107 108 108 108  --   --   CO2 22   < > 20*   < > 17*  --    < > 26 25 24 25 24   --   --   GLUCOSE 142*   < > 117*   < > 151*  --    < > 153* 117* 121* 153* 120*  --   --   BUN 15   < > 14   < > 17  --    < > 15 15 16 18 18   --   --   CREATININE 0.65   < > 0.63   < > 0.57   --    < > 0.56 0.54 0.57 0.68 0.55  --   --   CALCIUM 9.1   < > 9.0   < > 9.0  --    < > 8.5* 8.3* 8.3* 8.3* 8.3*  --   --   MG  --   --   --   --  1.7   < >  --   --  2.1 1.9 1.9  --  1.8 1.9  AST 32  --  40  --  48*  --   --   --   --   --   --   --   --   --   ALT 19  --  24  --  24  --   --   --   --   --   --   --   --   --   ALKPHOS 67  --  68  --  71  --   --   --   --   --   --   --   --   --   BILITOT 2.8*  --  5.0*  --  6.5*  --   --   --   --   --   --   --   --   --    < > = values in this interval not displayed.   ------------------------------------------------------------------------------------------------------------------ No results for input(s): CHOL, HDL, LDLCALC, TRIG, CHOLHDL, LDLDIRECT in the last 72 hours.  Lab Results  Component Value Date   HGBA1C 4.7 (L) 02/18/2020   ------------------------------------------------------------------------------------------------------------------ No results for input(s): TSH, T4TOTAL, T3FREE, THYROIDAB in the last 72 hours.  Invalid input(s): FREET3 ------------------------------------------------------------------------------------------------------------------ No results for input(s): VITAMINB12, FOLATE, FERRITIN, TIBC, IRON, RETICCTPCT in the last 72 hours.  Coagulation profile No results for input(s): INR, PROTIME in the last 168 hours.  No results for input(s): DDIMER in the last 72 hours.  Cardiac Enzymes No results for input(s): CKMB, TROPONINI, MYOGLOBIN in the last 168 hours.  Invalid input(s): CK ------------------------------------------------------------------------------------------------------------------ No results found for: BNP  Inpatient Medications  Scheduled Meds: . chlorhexidine  15 mL Mouth Rinse BID  . Chlorhexidine Gluconate Cloth  6 each Topical Daily  . folic acid  1 mg Intravenous Daily  . free water  150 mL Per Tube Q4H  . lactulose  30 g Per Tube BID  . mouth rinse  15 mL Mouth Rinse  QID  . pantoprazole sodium  40 mg Per Tube Daily  . rifaximin  550 mg Per Tube BID   Continuous Infusions: . feeding supplement (  JEVITY 1.5 CAL/FIBER) 1,000 mL (03/29/20 1111)   PRN Meds:.hydrALAZINE, LORazepam, ondansetron (ZOFRAN) IV  Micro Results Recent Results (from the past 240 hour(s))  Respiratory Panel by RT PCR (Flu A&B, Covid) - Nasopharyngeal Swab     Status: None   Collection Time: 03/21/20 10:37 AM   Specimen: Nasopharyngeal Swab  Result Value Ref Maka Status   SARS Coronavirus 2 by RT PCR NEGATIVE NEGATIVE Final    Comment: (NOTE) SARS-CoV-2 target nucleic acids are NOT DETECTED. The SARS-CoV-2 RNA is generally detectable in upper respiratoy specimens during the acute phase of infection. The lowest concentration of SARS-CoV-2 viral copies this assay can detect is 131 copies/mL. A negative result does not preclude SARS-Cov-2 infection and should not be used as the sole basis for treatment or other patient management decisions. A negative result may occur with  improper specimen collection/handling, submission of specimen other than nasopharyngeal swab, presence of viral mutation(s) within the areas targeted by this assay, and inadequate number of viral copies (<131 copies/mL). A negative result must be combined with clinical observations, patient history, and epidemiological information. The expected result is Negative. Fact Sheet for Patients:  https://www.moore.com/ Fact Sheet for Healthcare Providers:  https://www.young.biz/ This test is not yet ap proved or cleared by the [place] FDA and  has been authorized for detection and/or diagnosis of SARS-CoV-2 by FDA under an Emergency Use Authorization (EUA). This EUA will remain  in effect (meaning this test can be used) for the duration of the COVID-19 declaration under Section 564(b)(1) of the Act, 21 U.S.C. section 360bbb-3(b)(1), unless the authorization is terminated  or revoked sooner.    Influenza A by PCR NEGATIVE NEGATIVE Final   Influenza B by PCR NEGATIVE NEGATIVE Final    Comment: (NOTE) The Xpert Xpress SARS-CoV-2/FLU/RSV assay is intended as an aid in  the diagnosis of influenza from Nasopharyngeal swab specimens and  should not be used as a sole basis for treatment. Nasal washings and  aspirates are unacceptable for Xpert Xpress SARS-CoV-2/FLU/RSV  testing. Fact Sheet for Patients: https://www.moore.com/ Fact Sheet for Healthcare Providers: https://www.young.biz/ This test is not yet approved or cleared by the [place] FDA and  has been authorized for detection and/or diagnosis of SARS-CoV-2 by  FDA under an Emergency Use Authorization (EUA). This EUA will remain  in effect (meaning this test can be used) for the duration of the  Covid-19 declaration under Section 564(b)(1) of the Act, 21  U.S.C. section 360bbb-3(b)(1), unless the authorization is  terminated or revoked. Performed at [facility], [address] [place], [place] [address]   Urine Culture     Status: None   Collection Time: 03/26/20 11:39 AM   Specimen: Urine, Catheterized  Result Value Ref Rabon Status   Specimen Description   Final    URINE, CATHETERIZED Performed at [facility], [address], [place], [place] [address]    Special Requests   Final    NONE Performed at [facility], [address] [place], [place] [address]    Culture   Final    NO GROWTH Performed at [facility], [address], [place], [place] [address]    Report Status 03/27/2020 FINAL  Final  SARS CORONAVIRUS 2 (TAT 6-24 HRS) Nasopharyngeal Nasopharyngeal Swab     Status: None   Collection Time: 03/26/20 12:03 PM   Specimen: Nasopharyngeal Swab  Result Value Ref Scrima Status   SARS Coronavirus 2 NEGATIVE NEGATIVE Final    Comment: (NOTE)  SARS-CoV-2 target nucleic acids are NOT DETECTED. The  SARS-CoV-2 RNA is generally detectable in upper and lower respiratory specimens during the acute phase of infection. Negative results do not preclude SARS-CoV-2 infection, do not rule out co-infections with other pathogens, and should not be used as the sole basis for treatment or other patient management decisions. Negative results must be combined with clinical observations, patient history, and epidemiological information. The expected result is Negative. Fact Sheet for Patients: HairSlick.nohttps://www.fda.gov/media/138098/download Fact Sheet for Healthcare Providers: quierodirigir.comhttps://www.fda.gov/media/138095/download This test is not yet approved or cleared by the [place] FDA and  has been authorized for detection and/or diagnosis of SARS-CoV-2 by FDA under an Emergency Use Authorization (EUA). This EUA will remain  in effect (meaning this test can be used) for the duration of the COVID-19 declaration under Section 56 4(b)(1) of the Act, 21 U.S.C. section 360bbb-3(b)(1), unless the authorization is terminated or revoked sooner. Performed at [facility], [address] [place], [place] [address]   CULTURE, BLOOD (ROUTINE X 2) w Reflex to ID Panel     Status: None (Preliminary result)   Collection Time: 03/26/20  1:23 PM   Specimen: BLOOD  Result Value Ref Wey Status   Specimen Description BLOOD LEFT ANTECUBITAL  Final   Special Requests   Final    BOTTLES DRAWN AEROBIC AND ANAEROBIC Blood Culture adequate volume   Culture   Final    NO GROWTH 4 DAYS Performed at [facility], [address] [place], [place] [address]    Report Status PENDING  Incomplete  CULTURE, BLOOD (ROUTINE X 2) w Reflex to ID Panel     Status: None (Preliminary result)   Collection Time: 03/26/20  1:23 PM   Specimen: BLOOD  Result Value Ref Bamba Status   Specimen Description BLOOD BLOOD LEFT HAND  Final   Special Requests   Final    BOTTLES DRAWN AEROBIC AND ANAEROBIC Blood Culture adequate  volume   Culture   Final    NO GROWTH 4 DAYS Performed at [facility], [address] [place], [place] [address]    Report Status PENDING  Incomplete  CULTURE, BLOOD (ROUTINE X 2) w Reflex to ID Panel     Status: None (Preliminary result)   Collection Time: 03/28/20  2:44 AM   Specimen: BLOOD  Result Value Ref Jutras Status   Specimen Description BLOOD LEFT HAND  Final   Special Requests   Final    BOTTLES DRAWN AEROBIC AND ANAEROBIC Blood Culture adequate volume   Culture   Final    NO GROWTH 2 DAYS Performed at [facility], [address] [place], [place] [address]    Report Status PENDING  Incomplete  CULTURE, BLOOD (ROUTINE X 2) w Reflex to ID Panel     Status: None (Preliminary result)   Collection Time: 03/28/20  2:56 AM   Specimen: BLOOD  Result Value Ref Littman Status   Specimen Description BLOOD RIGHT AC  Final   Special Requests   Final    BOTTLES DRAWN AEROBIC AND ANAEROBIC Blood Culture results may not be optimal due to an inadequate volume of blood received in culture bottles   Culture   Final    NO GROWTH 2 DAYS Performed at [facility], [address] [place], [place] [address]    Report Status PENDING  Incomplete  SARS CORONAVIRUS 2 (TAT 6-24 HRS) Nasopharyngeal Nasopharyngeal Swab     Status: None   Collection Time: 03/29/20  3:47 PM   Specimen: Nasopharyngeal Swab  Result Value Ref Wnuk Status   SARS Coronavirus 2 NEGATIVE NEGATIVE Final    Comment: (NOTE) SARS-CoV-2 target nucleic acids are NOT DETECTED. The SARS-CoV-2 RNA is generally detectable in upper and lower respiratory specimens during the acute phase of infection. Negative results do not preclude SARS-CoV-2 infection, do not rule out co-infections with other pathogens, and should not be used as the sole basis for treatment or other patient management decisions. Negative results must be combined with clinical observations, patient history, and epidemiological  information. The expected result is Negative. Fact Sheet for Patients: HairSlick.no Fact Sheet for Healthcare Providers: quierodirigir.com This test is not yet approved or cleared by the [place] FDA and  has been authorized for detection and/or diagnosis of SARS-CoV-2 by FDA under an Emergency Use Authorization (EUA). This EUA will remain  in effect (meaning this test can be used) for the duration of the COVID-19 declaration under Section 56 4(b)(1) of the Act, 21 U.S.C. section 360bbb-3(b)(1), unless the authorization is terminated or revoked sooner. Performed at [facility], [address], [place], [place] [address]     Radiology Reports DG Chest 1 View  Result Date: 03/28/2020 CLINICAL DATA:  Check nasogastric catheter placement EXAM: CHEST  1 VIEW COMPARISON:  03/28/2019 FINDINGS: Cardiac shadow is stable. Gastric catheter is noted extending into the stomach and subsequently into the second portion of the duodenum. Patchy atelectatic changes are noted in the bases bilaterally. IMPRESSION: Bibasilar atelectasis increased from the prior exam. Gastric catheter within the duodenum. Electronically Signed   By: Alcide Clever M.D.   On: 03/28/2020 23:00   DG Chest 1 View  Result Date: 03/27/2020 CLINICAL DATA:  Cough EXAM: CHEST  1 VIEW COMPARISON:  March 24, 2020 FINDINGS: The heart size and mediastinal contours are within normal limits. Both lungs are clear. The visualized skeletal structures are unremarkable. IMPRESSION: No active disease. Electronically Signed   By: Katherine Mantle M.D.   On: 03/27/2020 22:13   DG Chest 2 View  Addendum Date: 03/25/2020   ADDENDUM REPORT: 03/25/2020 10:15 ADDENDUM: No evidence to suggest active TB infection. No changes to the Findings section of the report. Electronically Signed   By: Duanne Guess D.O.   On: 03/25/2020 10:15   Result Date: 03/25/2020 CLINICAL DATA:  Smoking  history, hypertension EXAM: CHEST - 2 VIEW COMPARISON:  03/19/2020 FINDINGS: The heart size and mediastinal contours are stable. Atherosclerotic calcification of the aortic knob. No focal airspace consolidation, pleural effusion, or pneumothorax. The visualized skeletal structures are unremarkable. IMPRESSION: No active cardiopulmonary disease. Electronically Signed: By: Duanne Guess D.O. On: 03/24/2020 16:39   CT Head Wo Contrast  Result Date: 03/19/2020 CLINICAL DATA:  Altered mental status, confusion, recent ETOH detox EXAM: CT HEAD WITHOUT CONTRAST TECHNIQUE: Contiguous axial images were obtained from the base of the skull through the vertex without intravenous contrast. COMPARISON:  CT head 02/18/2020 FINDINGS: Brain: No evidence of acute infarction, hemorrhage, hydrocephalus, extra-axial collection or mass lesion/mass effect. Symmetric prominence of the ventricles, cisterns and sulci compatible with parenchymal volume loss. Patchy areas of white matter hypoattenuation are most compatible with chronic microvascular angiopathy. Mild hyperattenuation of the globus pallidi may reflect senescent mineralization similar to prior exams. Vascular: Atherosclerotic calcification of the carotid siphons. No hyperdense vessel. Skull: Motion at the level of the skull base. No visible calvarial fracture or suspicious osseous lesion. No scalp swelling or hematoma. Sinuses/Orbits: Paranasal sinuses and mastoid air cells are  predominantly clear. Included orbital structures are unremarkable. Other: Likely chronic deformity of the nasal bones. IMPRESSION: No acute intracranial abnormality. Mild chronic microvascular angiopathy and parenchymal volume loss similar to comparison. Motion degradation may limit evaluation of the skull base and posterior fossa. Electronically Signed   By: Kreg Shropshire M.D.   On: 03/19/2020 23:40   CT ABDOMEN PELVIS W CONTRAST  Result Date: 03/20/2020 CLINICAL DATA:  Abdominal distension,  possibly Wernicke-Korsakoff syndrome EXAM: CT ABDOMEN AND PELVIS WITH CONTRAST TECHNIQUE: Multidetector CT imaging of the abdomen and pelvis was performed using the standard protocol following bolus administration of intravenous contrast. CONTRAST:  75mL OMNIPAQUE IOHEXOL 300 MG/ML  SOLN COMPARISON:  None. FINDINGS: Lower chest: Atelectatic changes are present in the lung bases. No consolidation or effusion. Cardiac size is upper limits normal. Extensive coronary artery calcifications are present. No pericardial effusion. Hepatobiliary: Slightly nodular hepatic surface contour difficult to fully assess given patient respiratory motion artifact. No focal liver lesion. No visible calcified gallstones or biliary ductal dilatation. Pancreas: Unremarkable. No pancreatic ductal dilatation or surrounding inflammatory changes. Spleen: Normal in size without focal abnormality. Adrenals/Urinary Tract: Normal adrenal glands. Questionable cyst arising from the upper pole left kidney though difficult to fully assess given extensive respiratory motion artifact. No other focal renal lesions. Mild bilateral symmetric perinephric stranding, a nonspecific finding though may correlate with either age or decreased renal function. No urolithiasis or hydronephrosis. Urinary bladder is unremarkable. Stomach/Bowel: Mild edematous mural thickening of the stomach and duodenum. No small bowel dilatation. The appendix is poorly visualized. Diffuse mild colonic mural thickening more pronounced towards the descending and sigmoid colon. Scattered colonic diverticula without focal diverticular inflammation to suggest acute diverticulitis. Vascular/Lymphatic: Atherosclerotic plaque within the normal caliber aorta. Some congested mid mesenteric nodes are noted. No pathologically enlarged nodes in the abdomen or pelvis. Reproductive: Slightly retroverted uterus with some questionable endometrial thickening measuring up to 11 mm. No concerning adnexal  lesions are seen. Other: Circumferential mild body wall edema as well as edematous changes in the central mesentery. Small volume ascites noted in the right subdiaphragmatic space. No bowel containing hernias. Small fat containing left inguinal hernia. Musculoskeletal: Multilevel degenerative changes are present in the imaged portions of the spine. No acute osseous abnormality or suspicious osseous lesion. IMPRESSION: 1. Nodular hepatic surface contour, such finding may be seen in the setting of intrinsic liver disease/cirrhosis. 2. Diffuse edematous changes of the stomach, colon and small bowel. While these could feasibly be related to an enterocolitis, finding would be more nonspecific in the setting intrinsic liver as portal Lorie Apley neuropathy/colopathy could have a similar appearance. 3. Small volume ascites in the right subdiaphragmatic space. Could be related to possible liver disease or anasarca given circumferential body wall edema and mesenteric edema. 4. Questionable endometrial thickening measuring up to 11 mm. Recommend nonemergent, outpatient pelvic ultrasound for further evaluation. 5. Extensive coronary artery calcifications. 6. Small fat containing left inguinal hernia. 7. Aortic Atherosclerosis (ICD10-I70.0). Electronically Signed   By: Kreg Shropshire M.D.   On: 03/20/2020 01:26   DG Chest Port 1 View  Result Date: 03/19/2020 CLINICAL DATA:  Altered mental status EXAM: PORTABLE CHEST 1 VIEW COMPARISON:  None. FINDINGS: Low lung volumes with streaky opacities favoring atelectasis. No pneumothorax or effusion. No convincing features of edema. Slight prominence of the cardiac silhouette possibly related to portable technique. The aorta is calcified. The remaining cardiomediastinal contours are unremarkable. No acute osseous or soft tissue abnormality. Degenerative changes are present in the imaged spine and shoulders.  Telemetry leads overlie the chest. IMPRESSION: Streaky basilar opacities favor  atelectasis in the setting of low lung volumes. Aortic Atherosclerosis (ICD10-I70.0). Electronically Signed   By: Lovena Le M.D.   On: 03/19/2020 22:38    Time Spent in minutes 25   Ottilie Wigglesworth M.D on 03/30/2020 at 1:25 PM  Between 7am to 7pm - Pager - [phone]  After 7pm go to [email or web address] - password [facility]  [facility] -  Office  [phone]

## 2020-03-30 NOTE — TOC Progression Note (Signed)
Transition of Care [facility]) - Progression Note    Patient Details  Name: Sheila Bowman MRN: [id number] Date of Birth: 1951/04/18  Transition of Care [facility]) CM/SW Contact  Shawn Route, RN Phone Number: 03/30/2020, 10:01 AM  Clinical Narrative:     Patient had a decline over past couple of days, feeding tubed placed, family and MD in communication about status of patient.  Family waiting to improvement.   Spoke to [place] at [facility] and family did go move in furniture for patient on Monday.  Although patient is no longer discharge appropriate.  Patient has currently has bed at [facility], but patient has never actually been to facility.   Expected Discharge Plan: Memory Care Barriers to Discharge: Other (comment)(placement into [name])  Expected Discharge Plan and Services Expected Discharge Plan: Memory Care       Living arrangements for the past 2 months: Single Family Home                                       Social Determinants of Health (SDOH) Interventions    Readmission Risk Interventions No flowsheet data found.

## 2020-03-30 NOTE — Progress Notes (Signed)
1st attempt at NG placement (18Fr.)- Patient fought.  Would not swallow- soon after attempt patient started bleeding in mouth and right nare.   2nd attempt in left Nare- was able to place a 60fr. Without complications.     Post NG tube placement - xray was done.    Paged MD-  Let him know about bleeding.   Will wait a few hours before starting feeding back to allow for bleeding to stop.

## 2020-03-31 DIAGNOSIS — Z7189 Other specified counseling: Secondary | ICD-10-CM

## 2020-03-31 DIAGNOSIS — R627 Adult failure to thrive: Secondary | ICD-10-CM | POA: Diagnosis present

## 2020-03-31 DIAGNOSIS — Z515 Encounter for palliative care: Secondary | ICD-10-CM

## 2020-03-31 LAB — CULTURE, BLOOD (ROUTINE X 2)
Culture: NO GROWTH
Culture: NO GROWTH
Special Requests: ADEQUATE
Special Requests: ADEQUATE

## 2020-03-31 LAB — GLUCOSE, CAPILLARY
Glucose-Capillary: 140 mg/dL — ABNORMAL HIGH (ref 70–99)
Glucose-Capillary: 110 mg/dL — ABNORMAL HIGH (ref 70–99)
Glucose-Capillary: 146 mg/dL — ABNORMAL HIGH (ref 70–99)
Glucose-Capillary: 96 mg/dL (ref 70–99)
Glucose-Capillary: 89 mg/dL (ref 70–99)

## 2020-03-31 MED ORDER — HALOPERIDOL LACTATE 5 MG/ML IJ SOLN: 0.5000 mg | Freq: Four times a day (QID) | INTRAMUSCULAR | Status: DC | PRN

## 2020-03-31 NOTE — Progress Notes (Signed)
Patient displaying anxiety, restlessness and stating there is a cat in bed with her. Patient noted pulling at NG tube as well as biting on mittens in effort to remove them. PRN administered, will continue to monitor to ensure comfort and safety.

## 2020-03-31 NOTE — Progress Notes (Signed)
PROGRESS NOTE                                                                                                                                                                                                             Patient Demographics:    Sheila Bowman, is a 69 y.o. female, DOB - 10/09/1951, UMP:[id number]  Admit date - 03/19/2020   Admitting Physician Ivor Costa, MD  Outpatient Primary MD for the patient is [facility]  Outpatient Specialists: None  Chief Complaint  Patient presents with  . 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  She was observed in the ED since 3/20 due to altered mental status and hallucination with suspicion for Wernicke's Korsakoff syndrome.  Received folic acid and thiamine in the ED with some improvement but again became worse on 3/26.  She was found to have increasing ammonia level to 144 and admitted for further management of acute metabolic encephalopathy.   Subjective:   Patient more awake but still incoherent.  Received IV Ativan for agitation last evening (was clearly instructed not to be given) pulled out her NG this morning.  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  Episodes of somnolence and agitation. Should not receive IV Ativan. PRN IV haldol for agitation. Received 3 days of high-dose IV thiamine (completed on 3/30). Monitor electrolytes including phosphorus given high risk for refeeding syndrome. Neurology consult appreciated.    palliative care to meet with daughter goals of care discussion (PEG tube  placement, disposition and possible hospice versus discharge to memory care based on any improvement).   Off NG today ( pulled out). Will wait for pall care discussion with daughter before placing it back.  Active problems Hypokalemia Replenished.  Normal magnesium.   Type 2 diabetes mellitus without complication Q0G of 4.7.  Stable.  Not on home meds.  Essential hypertension Off home oral meds.  As needed hydralazine.  Normocytic anemia Stable.  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  Was discharged home with her daughter and per her she has not had alcohol since then (greater than 1 month). Patient has chronically elevated total bilirubin and now with hepatic encephalopathy.  Continue lactulose.  ?Seizures  noted for tongue laceration on exam.  She had normal EEG 1 month back. No current issues  ? Endometrial thickening Found incidentally on CT scan.  Outpatient GYN follow-up.  Goals of care Seen by palliative care last hospitalization.  Discussed with daughter on 3/30. Involved palliative care again FOR GOC discussion.    Code Status : DNR  Family Communication  : Daughter involved in care  Disposition Plan: memory care vs hospice depending on improvement and GOC dissuasion  Barriers For Discharge : Persistent encephalopathy.  Consults  : Neurology  Procedures  : None  DVT Prophylaxis  : SCDs  Lab Results  Component Value Date   PLT 90 (L) 03/27/2020    Antibiotics  :    Anti-infectives (From admission, onward)   Start     Dose/Rate Route Frequency Ordered Stop   03/29/20 1000  rifaximin (XIFAXAN) tablet 550 mg     550 mg Per Tube 2 times daily 03/28/20 2352 04/02/20 2159   03/28/20 2200  rifaximin (XIFAXAN) tablet 550 mg  Status:  Discontinued     550 mg Oral 2 times daily 03/28/20 2017 03/28/20 2352   03/26/20 1400  cefTRIAXone (ROCEPHIN) 1 g in sodium chloride 0.9 % 100 mL IVPB   Status:  Discontinued     1 g 200 mL/hr over 30 Minutes Intravenous Every 24 hours 03/26/20 1304 03/27/20 1335        Objective:   Vitals:   03/31/20 0345 03/31/20 0346 03/31/20 0808 03/31/20 1144  BP:  (!) 121/53 135/64 (!) 139/59  Pulse:  79 80 90  Resp:   16 16  Temp:  98.5 F (36.9 C) 98.9 F (37.2 C) 97.9 F (36.6 C)  TempSrc:  Oral Oral   SpO2:  100% 95% 97%  Weight: 60.8 kg     Height:        Wt Readings from Last 3 Encounters:  03/31/20 60.8 kg  03/10/20 65.3 kg  02/23/20 53.9 kg     Intake/Output Summary (Last 24 hours) at 03/31/2020 1243 Last data filed at 03/31/2020 0813 Gross per 24 hour  Intake --  Output 2475 ml  Net -2475 ml    Physical exam Awake, confused HEENT: Moist mucosa Chest: Clear CVs: Normal S1-S2 GI: Soft, nondistended, nontender Musculoskeletal: Warm, no edema       Data Review:    CBC Recent Labs  Lab 03/26/20 1139 03/27/20 0536 03/27/20 2241  WBC 6.3 7.2 9.6  HGB 9.8* 10.4* 10.4*  HCT 30.5* 32.1* 32.9*  PLT 72* 89* 90*  MCV 96.8 97.3 97.9  MCH 31.1 31.5 31.0  MCHC 32.1 32.4 31.6  RDW 15.7* 15.5 15.8*    Chemistries  Recent Labs  Lab 03/26/20 1139 03/26/20 1139 03/27/20 0536 03/27/20 0536 03/27/20 2241 03/27/20 2241 03/28/20 0553 03/28/20 1130 03/28/20 1709 03/28/20 2222 03/29/20 0548 03/29/20 1232 03/29/20 1653 03/30/20 0427  NA 141   < > 142   < > 140  --    < > 140 140 139 138 139  --   --   K 4.3   < > 3.9   < > 3.5  --    < > 3.3* 4.0 4.0 3.7 3.9  --   --   CL 112*   < >  116*   < > 110  --    < > 106 107 108 108 108  --   --   CO2 22   < > 20*   < > 17*  --    < > --   --   GLUCOSE 142*   < > 117*   < > 151*  --    < > 153* 117* 121* 153* 120*  --   --   BUN 15   < > 14   < > 17  --    < > --   --   CREATININE 0.65   < > 0.63   < > 0.57  --    < > 0.56 0.54 0.57 0.68 0.55  --   --   CALCIUM 9.1   < > 9.0   < > 9.0  --    < > 8.5* 8.3* 8.3* 8.3* 8.3*  --   --   MG   --   --   --   --  1.7   < >  --   --  2.1 1.9 1.9  --  1.8 1.9  AST 32  --  40  --  48*  --   --   --   --   --   --   --   --   --   ALT 19  --  24  --  24  --   --   --   --   --   --   --   --   --   ALKPHOS 67  --  68  --  71  --   --   --   --   --   --   --   --   --   BILITOT 2.8*  --  5.0*  --  6.5*  --   --   --   --   --   --   --   --   --    < > = values in this interval not displayed.   ------------------------------------------------------------------------------------------------------------------ No results for input(s): CHOL, HDL, LDLCALC, TRIG, CHOLHDL, LDLDIRECT in the last 72 hours.  Lab Results  Component Value Date   HGBA1C 4.7 (L) 02/18/2020   ------------------------------------------------------------------------------------------------------------------ No results for input(s): TSH, T4TOTAL, T3FREE, THYROIDAB in the last 72 hours.  Invalid input(s): FREET3 ------------------------------------------------------------------------------------------------------------------ No results for input(s): VITAMINB12, FOLATE, FERRITIN, TIBC, IRON, RETICCTPCT in the last 72 hours.  Coagulation profile No results for input(s): INR, PROTIME in the last 168 hours.  No results for input(s): DDIMER in the last 72 hours.  Cardiac Enzymes No results for input(s): CKMB, TROPONINI, MYOGLOBIN in the last 168 hours.  Invalid input(s): CK ------------------------------------------------------------------------------------------------------------------ No results found for: BNP  Inpatient Medications  Scheduled Meds: . chlorhexidine  15 mL Mouth Rinse BID  . Chlorhexidine Gluconate Cloth  6 each Topical Daily  . folic acid  1 mg Intravenous Daily  . free water  150 mL Per Tube Q4H  . lactulose  30 g Per Tube BID  . mouth rinse  15 mL Mouth Rinse QID  . pantoprazole sodium  40 mg Per Tube Daily  . rifaximin  550 mg Per Tube BID   Continuous Infusions: . feeding  supplement (JEVITY 1.5 CAL/FIBER) 1,000 mL (03/30/20 2205)   PRN Meds:.haloperidol lactate, hydrALAZINE, ondansetron (ZOFRAN) IV, sodium chloride  Micro Results Recent Results (from the past 240 hour(s))  Urine Culture     Status: None   Collection Time: 03/26/20 11:39 AM   Specimen: Urine, Catheterized  Result Value Ref Lyn Status   Specimen Description   Final    URINE, CATHETERIZED Performed at [facility], [address], [place], [place] [address]    Special Requests   Final    NONE Performed at [facility], [address] [place], [place] [address]    Culture   Final    NO GROWTH Performed at [facility], [address] [place], [place] [address]    Report Status 03/27/2020 FINAL  Final  SARS CORONAVIRUS 2 (TAT 6-24 HRS) Nasopharyngeal Nasopharyngeal Swab     Status: None   Collection Time: 03/26/20 12:03 PM   Specimen: Nasopharyngeal Swab  Result Value Ref Trussell Status   SARS Coronavirus 2 NEGATIVE NEGATIVE Final    Comment: (NOTE) SARS-CoV-2 target nucleic acids are NOT DETECTED. The SARS-CoV-2 RNA is generally detectable in upper and lower respiratory specimens during the acute phase of infection. Negative results do not preclude SARS-CoV-2 infection, do not rule out co-infections with other pathogens, and should not be used as the sole basis for treatment or other patient management decisions. Negative results must be combined with clinical observations, patient history, and epidemiological information. The expected result is Negative. Fact Sheet for Patients: HairSlick.no Fact Sheet for Healthcare Providers: quierodirigir.com This test is not yet approved or cleared by the [place] FDA and  has been authorized for detection and/or diagnosis of SARS-CoV-2 by FDA under an Emergency Use Authorization (EUA). This EUA will remain  in effect (meaning this test can be used) for  the duration of the COVID-19 declaration under Section 56 4(b)(1) of the Act, 21 U.S.C. section 360bbb-3(b)(1), unless the authorization is terminated or revoked sooner. Performed at [facility], [address], [place], [place] [address]   CULTURE, BLOOD (ROUTINE X 2) w Reflex to ID Panel     Status: None   Collection Time: 03/26/20  1:23 PM   Specimen: BLOOD  Result Value Ref Besson Status   Specimen Description BLOOD LEFT ANTECUBITAL  Final   Special Requests   Final    BOTTLES DRAWN AEROBIC AND ANAEROBIC Blood Culture adequate volume   Culture   Final    NO GROWTH 5 DAYS Performed at [facility], [address] [place], [place] [address]    Report Status 03/31/2020 FINAL  Final  CULTURE, BLOOD (ROUTINE X 2) w Reflex to ID Panel     Status: None   Collection Time: 03/26/20  1:23 PM   Specimen: BLOOD  Result Value Ref Duffus Status   Specimen Description BLOOD BLOOD LEFT HAND  Final   Special Requests   Final    BOTTLES DRAWN AEROBIC AND ANAEROBIC Blood Culture adequate volume   Culture   Final    NO GROWTH 5 DAYS Performed at [facility], [address] [place], [place] [address]    Report Status 03/31/2020 FINAL  Final  CULTURE, BLOOD (ROUTINE X 2) w Reflex to ID Panel     Status: None (Preliminary result)   Collection Time: 03/28/20  2:44 AM   Specimen: BLOOD  Result Value Ref Mederos Status   Specimen Description BLOOD LEFT HAND  Final   Special Requests   Final    BOTTLES DRAWN AEROBIC AND ANAEROBIC Blood Culture adequate volume   Culture  Final    NO GROWTH 3 DAYS Performed at [facility], [address] [place], [place] [address]    Report Status PENDING  Incomplete  CULTURE, BLOOD (ROUTINE X 2) w Reflex to ID Panel     Status: None (Preliminary result)   Collection Time: 03/28/20  2:56 AM   Specimen: BLOOD  Result Value Ref Prete Status   Specimen Description BLOOD RIGHT AC  Final   Special Requests   Final    BOTTLES  DRAWN AEROBIC AND ANAEROBIC Blood Culture results may not be optimal due to an inadequate volume of blood received in culture bottles   Culture   Final    NO GROWTH 3 DAYS Performed at [facility], [address], [place], [place] [address]    Report Status PENDING  Incomplete  SARS CORONAVIRUS 2 (TAT 6-24 HRS) Nasopharyngeal Nasopharyngeal Swab     Status: None   Collection Time: 03/29/20  3:47 PM   Specimen: Nasopharyngeal Swab  Result Value Ref Diesing Status   SARS Coronavirus 2 NEGATIVE NEGATIVE Final    Comment: (NOTE) SARS-CoV-2 target nucleic acids are NOT DETECTED. The SARS-CoV-2 RNA is generally detectable in upper and lower respiratory specimens during the acute phase of infection. Negative results do not preclude SARS-CoV-2 infection, do not rule out co-infections with other pathogens, and should not be used as the sole basis for treatment or other patient management decisions. Negative results must be combined with clinical observations, patient history, and epidemiological information. The expected result is Negative. Fact Sheet for Patients: HairSlick.no Fact Sheet for Healthcare Providers: quierodirigir.com This test is not yet approved or cleared by the [place] FDA and  has been authorized for detection and/or diagnosis of SARS-CoV-2 by FDA under an Emergency Use Authorization (EUA). This EUA will remain  in effect (meaning this test can be used) for the duration of the COVID-19 declaration under Section 56 4(b)(1) of the Act, 21 U.S.C. section 360bbb-3(b)(1), unless the authorization is terminated or revoked sooner. Performed at [facility], [address], [place], [place] [address]     Radiology Reports DG Chest 1 View  Result Date: 03/28/2020 CLINICAL DATA:  Check nasogastric catheter placement EXAM: CHEST  1 VIEW COMPARISON:  03/28/2019 FINDINGS: Cardiac shadow is stable. Gastric  catheter is noted extending into the stomach and subsequently into the second portion of the duodenum. Patchy atelectatic changes are noted in the bases bilaterally. IMPRESSION: Bibasilar atelectasis increased from the prior exam. Gastric catheter within the duodenum. Electronically Signed   By: Alcide Clever M.D.   On: 03/28/2020 23:00   DG Chest 1 View  Result Date: 03/27/2020 CLINICAL DATA:  Cough EXAM: CHEST  1 VIEW COMPARISON:  March 24, 2020 FINDINGS: The heart size and mediastinal contours are within normal limits. Both lungs are clear. The visualized skeletal structures are unremarkable. IMPRESSION: No active disease. Electronically Signed   By: Katherine Mantle M.D.   On: 03/27/2020 22:13   DG Chest 2 View  Addendum Date: 03/25/2020   ADDENDUM REPORT: 03/25/2020 10:15 ADDENDUM: No evidence to suggest active TB infection. No changes to the Findings section of the report. Electronically Signed   By: Duanne Guess D.O.   On: 03/25/2020 10:15   Result Date: 03/25/2020 CLINICAL DATA:  Smoking history, hypertension EXAM: CHEST - 2 VIEW COMPARISON:  03/19/2020 FINDINGS: The heart size and mediastinal contours are stable. Atherosclerotic calcification of the aortic knob. No focal airspace consolidation, pleural effusion, or pneumothorax. The visualized  skeletal structures are unremarkable. IMPRESSION: No active cardiopulmonary disease. Electronically Signed: By: Duanne Guess D.O. On: 03/24/2020 16:39   CT Head Wo Contrast  Result Date: 03/19/2020 CLINICAL DATA:  Altered mental status, confusion, recent ETOH detox EXAM: CT HEAD WITHOUT CONTRAST TECHNIQUE: Contiguous axial images were obtained from the base of the skull through the vertex without intravenous contrast. COMPARISON:  CT head 02/18/2020 FINDINGS: Brain: No evidence of acute infarction, hemorrhage, hydrocephalus, extra-axial collection or mass lesion/mass effect. Symmetric prominence of the ventricles, cisterns and sulci compatible  with parenchymal volume loss. Patchy areas of white matter hypoattenuation are most compatible with chronic microvascular angiopathy. Mild hyperattenuation of the globus pallidi may reflect senescent mineralization similar to prior exams. Vascular: Atherosclerotic calcification of the carotid siphons. No hyperdense vessel. Skull: Motion at the level of the skull base. No visible calvarial fracture or suspicious osseous lesion. No scalp swelling or hematoma. Sinuses/Orbits: Paranasal sinuses and mastoid air cells are predominantly clear. Included orbital structures are unremarkable. Other: Likely chronic deformity of the nasal bones. IMPRESSION: No acute intracranial abnormality. Mild chronic microvascular angiopathy and parenchymal volume loss similar to comparison. Motion degradation may limit evaluation of the skull base and posterior fossa. Electronically Signed   By: Kreg Shropshire M.D.   On: 03/19/2020 23:40   CT ABDOMEN PELVIS W CONTRAST  Result Date: 03/20/2020 CLINICAL DATA:  Abdominal distension, possibly Wernicke-Korsakoff syndrome EXAM: CT ABDOMEN AND PELVIS WITH CONTRAST TECHNIQUE: Multidetector CT imaging of the abdomen and pelvis was performed using the standard protocol following bolus administration of intravenous contrast. CONTRAST:  3mL OMNIPAQUE IOHEXOL 300 MG/ML  SOLN COMPARISON:  None. FINDINGS: Lower chest: Atelectatic changes are present in the lung bases. No consolidation or effusion. Cardiac size is upper limits normal. Extensive coronary artery calcifications are present. No pericardial effusion. Hepatobiliary: Slightly nodular hepatic surface contour difficult to fully assess given patient respiratory motion artifact. No focal liver lesion. No visible calcified gallstones or biliary ductal dilatation. Pancreas: Unremarkable. No pancreatic ductal dilatation or surrounding inflammatory changes. Spleen: Normal in size without focal abnormality. Adrenals/Urinary Tract: Normal adrenal  glands. Questionable cyst arising from the upper pole left kidney though difficult to fully assess given extensive respiratory motion artifact. No other focal renal lesions. Mild bilateral symmetric perinephric stranding, a nonspecific finding though may correlate with either age or decreased renal function. No urolithiasis or hydronephrosis. Urinary bladder is unremarkable. Stomach/Bowel: Mild edematous mural thickening of the stomach and duodenum. No small bowel dilatation. The appendix is poorly visualized. Diffuse mild colonic mural thickening more pronounced towards the descending and sigmoid colon. Scattered colonic diverticula without focal diverticular inflammation to suggest acute diverticulitis. Vascular/Lymphatic: Atherosclerotic plaque within the normal caliber aorta. Some congested mid mesenteric nodes are noted. No pathologically enlarged nodes in the abdomen or pelvis. Reproductive: Slightly retroverted uterus with some questionable endometrial thickening measuring up to 11 mm. No concerning adnexal lesions are seen. Other: Circumferential mild body wall edema as well as edematous changes in the central mesentery. Small volume ascites noted in the right subdiaphragmatic space. No bowel containing hernias. Small fat containing left inguinal hernia. Musculoskeletal: Multilevel degenerative changes are present in the imaged portions of the spine. No acute osseous abnormality or suspicious osseous lesion. IMPRESSION: 1. Nodular hepatic surface contour, such finding may be seen in the setting of intrinsic liver disease/cirrhosis. 2. Diffuse edematous changes of the stomach, colon and small bowel. While these could feasibly be related to an enterocolitis, finding would be more nonspecific in the setting intrinsic liver as  portal Lorie ApleyCastro neuropathy/colopathy could have a similar appearance. 3. Small volume ascites in the right subdiaphragmatic space. Could be related to possible liver disease or anasarca  given circumferential body wall edema and mesenteric edema. 4. Questionable endometrial thickening measuring up to 11 mm. Recommend nonemergent, outpatient pelvic ultrasound for further evaluation. 5. Extensive coronary artery calcifications. 6. Small fat containing left inguinal hernia. 7. Aortic Atherosclerosis (ICD10-I70.0). Electronically Signed   By: Kreg ShropshirePrice  DeHay M.D.   On: 03/20/2020 01:26   DG Chest Port 1 View  Result Date: 03/30/2020 CLINICAL DATA:  NG tube placement EXAM: PORTABLE CHEST 1 VIEW COMPARISON:  01/31/2020, 3:17 p.m. FINDINGS: Interval advancement of esophagogastric tube, tip and side port below the diaphragm. Cardiomegaly. No acute abnormality of the lungs. IMPRESSION: Interval advancement of esophagogastric tube, tip and side port below the diaphragm. Electronically Signed   By: Lauralyn PrimesAlex  Bibbey M.D.   On: 03/30/2020 16:42   DG Chest Port 1 View  Result Date: 03/30/2020 CLINICAL DATA:  NG TUBE. Hx of dm, htn EXAM: PORTABLE CHEST 1 VIEW COMPARISON:  Chest radiograph 03/28/2020 FINDINGS: The nasogastric tube tip appears to be within the upper esophagus. Stable cardiomediastinal contours. Low lung volumes. Improved aeration of the bilateral lung bases. No pneumothorax or significant pleural effusion. No acute finding in the visualized skeleton. IMPRESSION: Nasogastric tube tip appears to be within the upper esophagus. Recommend removal or repositioning into the stomach. Electronically Signed   By: Emmaline KluverNancy  Ballantyne M.D.   On: 03/30/2020 15:30   DG Chest Port 1 View  Result Date: 03/19/2020 CLINICAL DATA:  Altered mental status EXAM: PORTABLE CHEST 1 VIEW COMPARISON:  None. FINDINGS: Low lung volumes with streaky opacities favoring atelectasis. No pneumothorax or effusion. No convincing features of edema. Slight prominence of the cardiac silhouette possibly related to portable technique. The aorta is calcified. The remaining cardiomediastinal contours are unremarkable. No acute osseous or  soft tissue abnormality. Degenerative changes are present in the imaged spine and shoulders. Telemetry leads overlie the chest. IMPRESSION: Streaky basilar opacities favor atelectasis in the setting of low lung volumes. Aortic Atherosclerosis (ICD10-I70.0). Electronically Signed   By: Kreg ShropshirePrice  DeHay M.D.   On: 03/19/2020 22:38    Time Spent in minutes 25   Elba Schaber M.D on 03/31/2020 at 12:43 PM  Between 7am to 7pm - Pager - [phone]  After 7pm go to [email or web address] - password [email or web address]  [facility] -  Office  [phone]

## 2020-03-31 NOTE — Consult Note (Signed)
Consultation Note Date: 03/31/2020   Patient Name: Sheila Bowman  DOB: 1951-11-15  MRN: [id number]  Age / Sex: 69 y.o., female  PCP: [facility] Referring Physician: Eddie North, MD  Reason for Consultation: Establishing goals of care  HPI/Patient Profile: 69 y.o. female  with past medical history of HTN, ETOH use, possible Wernicke's Korsakoff syndrome, and dementia admitted on 03/19/2020 with AMS. Improved some during admission then worsened again 3/26. Ammonia levels increasing to 144. She was admitted for encephalopathy with ongoing episodes of agitation and somnolence. She pulled out her NG 4/1. Admitted 1 month ago for same. Also in ED a few weeks ago for delirium. PMT consulted for GOC.   Clinical Assessment and Goals of Care: I have reviewed medical records including EPIC notes, labs and imaging, received report from RN and sitter at bedside, assessed the patient and then spoke with patient's daughter [name] to discuss diagnosis prognosis, GOC, EOL wishes, disposition and options.  I introduced Palliative Medicine as specialized medical care for people living with serious illness. It focuses on providing relief from the symptoms and stress of a serious illness. The goal is to improve quality of life for both the patient and the family. [name] is familiar with our services during last admission.   We discussed how things have been going since patient's discharge about a month ago. [name] tells me things have been horrible. Her mother has been difficult to manage at home - frequent episodes of agitation, hallucinations, and combativeness. Urinates all over the house. Microwaving utensils. [name] feels that patient continues to decline.    We discussed patient's current illness and what it means in the larger context of patient's on-going co-morbidities.  Natural disease trajectory and expectations at EOL were discussed. We  discussed concern about patient's mental status - ongoing somnolence/agitation. Concern about ability to manage PO intake. Concern about aspiration.   I attempted to elicit values and goals of care important to the patient.  [name] shares patient would not want a feeding tube - we discussed note replacing NG and not considering PEG [name] agrees with this. We discussed trialing POs and accepting risk of aspiration - [name] agrees with this as well.  [name] is interested in support of hospice - discussed this could happen at ALF if patient is able to maintain some PO intake. Discussed if she cannot maintain PO intake or is consistently aspirating, hospice facility may be more appropriate. [name] is okay with either - whatever seems most appropriate in coming days as we work with Ms. E[place]ton ability to maintain PO intake - SLP has been consulted and I discussed with her.   Questions and concerns were addressed. The family was encouraged to call with questions or concerns.   Discussed above with Dr. Gonzella Lex and Natalia Leatherwood SLP  Primary Decision Maker NEXT OF KIN - children [name] and Barbara Cower    SUMMARY OF RECOMMENDATIONS   - No feeding tube - do not replace NG and do not pursue PEG - SLP eval - family accepts risk of aspiration, would like to see how much PO intake patient can manage on her own - interested in hospice support - hospice at ALF vs hospice facility depending on how patient tolerates POs - PMT will follow  Code Status/Advance Care Planning:  DNR  Additional Recommendations (Limitations, Scope, Preferences):  No Artificial Feeding  Psycho-social/Spiritual:   Desire for further Chaplaincy support:no  Additional Recommendations: Education on Hospice  Prognosis:   Unable to determine -  depends on PO intake  Discharge Planning: To Be Determined - hospice at ALF vs hospice facility     Primary Diagnoses: Present on Admission:  Acute metabolic encephalopathy  Essential  hypertension  Normocytic anemia  Elevated troponin  Alcohol abuse  Cirrhosis ([place])  Acute hepatic encephalopathy  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 The following aspects are pertinent.  Past Medical History:  Diagnosis Date   Diabetes mellitus without complication ([place])    Hypertension    Social History   Socioeconomic History   Marital status: Single    Spouse name: Not on file   Number of children: Not on file   Years of education: Not on file   Highest education level: Not on file  Occupational History   Not on file  Tobacco Use   Smoking status: Former Smoker   Smokeless tobacco: Never Used  Substance and Sexual Activity   Alcohol use: Yes    Comment: daily   Drug use: Never   Sexual activity: Not Currently  Other Topics Concern   Not on file  Social History Narrative   Not on file   Social Determinants of Health   Financial Resource Strain:    Difficulty of Paying Living Expenses:   Food Insecurity:    Worried About Charity fundraiser in the Last Year:    Arboriculturist in the Last Year:   Transportation Needs:    Film/video editor (Medical):    Lack of Transportation (Non-Medical):   Physical Activity:    Days of Exercise per Week:    Minutes of Exercise per Session:   Stress:    Feeling of Stress :   Social Connections:    Frequency of Communication with Friends and Family:    Frequency of Social Gatherings with Friends and Family:    Attends Religious Services:    Active Member of Clubs or Organizations:    Attends Music therapist:    Marital Status:    Family History  Problem Relation Age of Onset   Heart attack Father    Scheduled Meds:  chlorhexidine  15 mL Mouth Rinse BID   Chlorhexidine Gluconate Cloth  6 each Topical Daily   folic acid  1 mg Intravenous Daily    free water  150 mL Per Tube Q4H   lactulose  30 g Per Tube BID   mouth rinse  15 mL Mouth Rinse QID   pantoprazole sodium  40 mg Per Tube Daily   rifaximin  550 mg Per Tube BID   Continuous Infusions:  feeding supplement (JEVITY 1.5 CAL/FIBER) 1,000 mL (03/30/20 2205)   PRN Meds:.haloperidol lactate, hydrALAZINE, ondansetron (ZOFRAN) IV, sodium chloride No Known Allergies Review of Systems  Unable to perform ROS: Dementia    Physical Exam Constitutional:      Comments: Sleeping during me eval - does not respond to voice - did not attempt to wake as patient was very agitated earlier  Pulmonary:     Effort: Pulmonary effort is normal. No respiratory distress.  Musculoskeletal:     Right lower leg: No edema.     Left lower leg: No edema.  Skin:    General: Skin is warm and dry.     Vital Signs: BP (!) 139/59 (BP Location: Left Arm)    Pulse 90    Temp 97.9 F (36.6 C)    Resp 16  Ht 5' 5.51" (1.664 m)    Wt 60.8 kg    SpO2 97%    BMI 21.95 kg/m  Pain Scale: 0-10 POSS *See Group Information*: 1-Acceptable,Awake and alert Pain Score: 0-No pain   SpO2: SpO2: 97 % O2 Device:SpO2: 97 % O2 Flow Rate: .   IO: Intake/output summary:   Intake/Output Summary (Last 24 hours) at 03/31/2020 1454 Last data filed at 03/31/2020 0813 Gross per 24 hour  Intake --  Output 2475 ml  Net -2475 ml    LBM: Last BM Date: 03/30/20 Baseline Weight: Weight: 61.3 kg Most recent weight: Weight: 60.8 kg     Palliative Assessment/Data: PPS 10%    Time Total: 50 minutes Greater than 50%  of this time was spent counseling and coordinating care related to the above assessment and plan.  Gerlean Ren, DNP, AGNP-C Palliative Medicine Team [phone] Pager: [phone]

## 2020-03-31 NOTE — Progress Notes (Signed)
Patient pulled out NG tube while sitter at bedside. When nurse tech took mits off to get blood sugar patient was able to pull out tube. MD notified. Orders to keep out NG tube for now and turn feeds off. Palliative care to discuss goals of care with family today.

## 2020-04-01 DIAGNOSIS — Z515 Encounter for palliative care: Secondary | ICD-10-CM

## 2020-04-01 LAB — BASIC METABOLIC PANEL
Anion gap: 8 (ref 5–15)
BUN: 12 mg/dL (ref 8–23)
CO2: 23 mmol/L (ref 22–32)
Calcium: 8.7 mg/dL — ABNORMAL LOW (ref 8.9–10.3)
Chloride: 108 mmol/L (ref 98–111)
Creatinine, Ser: 0.46 mg/dL (ref 0.44–1.00)
GFR calc Af Amer: >60 mL/min (ref >60)
GFR calc non Af Amer: >60 mL/min (ref >60)
Glucose, Bld: 84 mg/dL (ref 70–99)
Potassium: 3.9 mmol/L (ref 3.5–5.1)
Sodium: 139 mmol/L (ref 135–145)

## 2020-04-01 LAB — GLUCOSE, CAPILLARY
Glucose-Capillary: 81 mg/dL (ref 70–99)
Glucose-Capillary: 80 mg/dL (ref 70–99)
Glucose-Capillary: 79 mg/dL (ref 70–99)
Glucose-Capillary: 189 mg/dL — ABNORMAL HIGH (ref 70–99)
Glucose-Capillary: 75 mg/dL (ref 70–99)
Glucose-Capillary: 116 mg/dL — ABNORMAL HIGH (ref 70–99)

## 2020-04-01 LAB — CBC
HCT: 30 % — ABNORMAL LOW (ref 36.0–46.0)
Hemoglobin: 9.7 g/dL — ABNORMAL LOW (ref 12.0–15.0)
MCH: 31 pg (ref 26.0–34.0)
MCHC: 32.3 g/dL (ref 30.0–36.0)
MCV: 95.8 fL (ref 80.0–100.0)
Platelets: 86 10*3/uL — ABNORMAL LOW (ref 150–400)
RBC: 3.13 MIL/uL — ABNORMAL LOW (ref 3.87–5.11)
RDW: 15.5 % (ref 11.5–15.5)
WBC: 4.1 10*3/uL (ref 4.0–10.5)
nRBC: 0 % (ref 0.0–0.2)

## 2020-04-01 LAB — MAGNESIUM: Magnesium: 1.8 mg/dL (ref 1.7–2.4)

## 2020-04-01 NOTE — Progress Notes (Signed)
Report given to [place], Charity fundraiser.  Pt transferred via bed to Rm 108.

## 2020-04-01 NOTE — Evaluation (Signed)
Clinical/Bedside Swallow Evaluation Patient Details  Name: Sheila Bowman MRN: [id number] Date of Birth: 02/10/1951  Today's Date: 04/01/2020 Time: SLP Start Time (ACUTE ONLY): 3343 SLP Stop Time (ACUTE ONLY): 1020 SLP Time Calculation (min) (ACUTE ONLY): 55 min  Past Medical History:  Past Medical History:  Diagnosis Date  . Diabetes mellitus without complication ([place])   . Hypertension    Past Surgical History: History reviewed. No pertinent surgical history. HPI:  Pt is a 69 y.o. female with medical history significant of hypertension, liver dis/cirrhosis, diet-controlled diabetes, dementia, alcohol abuse, possible Wernicke's Korsakoff syndrome, who presents with altered mental status. Patient has been obsereved in emergency room since 3/20 due to altered mental status and hallucination, suspecting Wernicke's Korsakoff syndrom. Pt was given folic acid and thiamin in ED. Per ED physician, patient mental status has improved in the past several days, but worsened again. She had negative CT-head on 3/20.  Patient had nausea, vomiting and diarrhea, which has all resolved.  NG feeding tube present for a few days but removed and not to be replaced per family and Palliative Care consult/NP.    Assessment / Plan / Recommendation Clinical Impression  Pt appears to present w/ adequate oropharyngeal phase swallowing function w/ no overt, clinical s/s of aspiration noted during oral intake/po trials this morning. Pt was awake/alert to verbally engage w/ SLP then feed self given setup. Tangential speech noted in conversation but pt followed instructions w/ cues. Pt needed positioning support to sit upright for oral intake. Pt consumed trials of thin liquids, purees and mech soft/solid food trials w/ no overt s/s of aspiration noted; no decline in vocal quality or respiratory status during/post trials. Oral phase appeared grossly [facility] for bolus management and mastication of soft solids; A-P transfer was timely  and oral clearing achieved post swallows. OM exam appeared [facility] w/ no unilateral weakness noted. Recommend a mech soft diet (for cut meats, easy to eat) w/ Thin liquids. Recommend general aspiration precautions; pills w/ a puree if needed for easier swallowing than w/ liquids. Tray setup at meals; monitoring for support as needed. Pt appears at/close to her baseline re: swallowing. NSG to reconsult if any new needs while admitted. CXR on 03/30/2020: "No acute abnormality of the lungs." SLP Visit Diagnosis: Dysphagia, unspecified (R13.10)    Aspiration Risk  (reduced following precautions)    Diet Recommendation  Mech Soft diet (for the cut meats); Thin liquids. General aspiration precautions. Tray setup and support at meals.  Medication Administration: Whole meds with puree(for safer swallowing)    Other  Recommendations Recommended Consults: (Dietician f/u) Oral Care Recommendations: Oral care BID;Oral care before and after PO;Staff/trained caregiver to provide oral care Other Recommendations: (n/a)   Follow up Recommendations None      Frequency and Duration (n/a)  (n/a)       Prognosis Prognosis for Safe Diet Advancement: Good Barriers to Reach Goals: Cognitive deficits      Swallow Study   General Date of Onset: 03/25/20 HPI: Pt is a 69 y.o. female with medical history significant of hypertension, liver dis/cirrhosis, diet-controlled diabetes, dementia, alcohol abuse, possible Wernicke's Korsakoff syndrome, who presents with altered mental status. Patient has been obsereved in emergency room since 3/20 due to altered mental status and hallucination, suspecting Wernicke's Korsakoff syndrom. Pt was given folic acid and thiamin in ED. Per ED physician, patient mental status has improved in the past several days, but worsened again. She had negative CT-head on 3/20.  Patient had nausea, vomiting and diarrhea,  which has all resolved.  NG feeding tube present for a few days but removed and  not to be replaced per family and Palliative Care consult/NP.  Type of Study: Bedside Swallow Evaluation Previous Swallow Assessment: none Diet Prior to this Study: NPO(regular foods at home prior) Temperature Spikes Noted: No(wbc 4.1) Respiratory Status: Room air History of Recent Intubation: No Behavior/Cognition: Alert;Cooperative;Pleasant mood;Confused;Distractible;Requires cueing Oral Cavity Assessment: Dried secretions(removed) Oral Care Completed by SLP: Yes Oral Cavity - Dentition: Adequate natural dentition Vision: Functional for self-feeding Self-Feeding Abilities: Able to feed self;Needs assist;Needs set up(w/ Mitt in place) Patient Positioning: Upright in bed(needed positioning ) Baseline Vocal Quality: Normal Volitional Cough: Strong Volitional Swallow: Able to elicit    Oral/Motor/Sensory Function Overall Oral Motor/Sensory Function: Within functional limits   Ice Chips Ice chips: Within functional limits Presentation: Spoon(fed; 3 trials)   Thin Liquid Thin Liquid: Within functional limits Presentation: Cup;Self Fed(~10+ ozs)    Nectar Thick Nectar Thick Liquid: Not tested   Honey Thick Honey Thick Liquid: Not tested   Puree Puree: Within functional limits Presentation: Spoon;Self Fed(assisted; ~3 ozs)   Solid     Solid: Within functional limits(grossly) Presentation: Self Fed;Spoon(assisted; 8 trials)       Jerilynn Som, MS, CCC-SLP Mathilda Maguire 04/01/2020,1:25 PM

## 2020-04-01 NOTE — Care Management Important Message (Signed)
Important Message  Patient Details  Name: Sheila Bowman MRN: [id number] Date of Birth: 14-Jan-1951   Medicare Important Message Given:  Yes     [name] 04/01/2020, 12:20 PM

## 2020-04-01 NOTE — Progress Notes (Signed)
PROGRESS NOTE                                                                                                                                                                                                             Patient Demographics:    Sheila Bowman, is a 69 y.o. female, DOB - [birth year], HAF:[id number]  Admit date - 03/19/2020   Admitting Physician Ivor Costa, MD  Outpatient Primary MD for the patient is [facility] [place] [facility]  Outpatient Specialists: None  Chief Complaint  Patient presents with  . 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  She was observed in the ED since 3/20 due to altered mental status and hallucination with suspicion for Wernicke's Korsakoff syndrome.  Received folic acid and thiamine in the ED with some improvement but again became worse on 3/26.  She was found to have increasing ammonia level to 144 and admitted for further management of acute metabolic encephalopathy.   Subjective:   Patient more alert although still confused.  Last night early this morning was reported she was agitated and yelling out.  Did well with swallow evaluation.  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  Episodes of somnolence and agitation.  More awake now and has periodic agitation. Ativan has been discontinued completely and as needed IV Haldol given for agitation Completed 3 days of high-dose IV thiamine. Neurology consult appreciated.  Palliative care consult appreciated.  Had met with patient's daughter during last hospitalization.  Met with her  again yesterday for further goals of care discussion.  Daughter does not want any feeding tube or PEG.  She understands risk of aspiration but would like to see if patient can take p.o.  She is interested in hospice at ALF if stable.  Plan is for discharging her to ALF (memory care) with hospice if tolerating p.o. intake and agitation resolves.  Will discontinue safety sitter today.  Possibly tomorrow.  Active problems Hypokalemia Replenished.  Normal magnesium.   Type 2 diabetes mellitus without complication V2N of 4.7.  Stable.  Not on home meds.  Essential hypertension Off home oral meds.  As needed hydralazine.  Normocytic anemia Stable.  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  Was discharged home with her daughter and per her she has not had alcohol since then (greater than 1 month). Patient has chronically elevated total bilirubin and now with hepatic encephalopathy.  Continue lactulose.  ?Seizures  noted for tongue laceration on exam.  She had normal EEG 1 month back. No current issues  ? Endometrial thickening Found incidentally on CT scan.  Outpatient GYN follow-up.      Code Status : DNR  Family Communication  : Daughter involved in care  Disposition Plan: Possibly discharge to ALF memory care and hospice follow-up tomorrow.  Barriers For Discharge : Encephalopathy with agitation, dysphagia  Consults  : Neurology, palliative care  Procedures  : None  DVT Prophylaxis  : SCDs  Lab Results  Component Value Date   PLT 86 (L) 04/01/2020    Antibiotics  :    Anti-infectives (From admission, onward)   Start     Dose/Rate Route Frequency Ordered Stop   03/29/20 1000  rifaximin (XIFAXAN) tablet 550 mg     550 mg Per Tube 2 times daily 03/28/20 2352 04/02/20 2159   03/28/20 2200  rifaximin (XIFAXAN) tablet 550 mg  Status:  Discontinued     550 mg Oral 2 times daily 03/28/20 2017  03/28/20 2352   03/26/20 1400  cefTRIAXone (ROCEPHIN) 1 g in sodium chloride 0.9 % 100 mL IVPB  Status:  Discontinued     1 g 200 mL/hr over 30 Minutes Intravenous Every 24 hours 03/26/20 1304 03/27/20 1335        Objective:   Vitals:   04/01/20 0500 04/01/20 0518 04/01/20 0808 04/01/20 1136  BP:  (!) 142/70 (!) 142/65 (!) 147/64  Pulse:  76 82 82  Resp:   18 18  Temp:  98.1 F (36.7 C) 99 F (37.2 C) 99.5 F (37.5 C)  TempSrc:   Oral Oral  SpO2:  96%  98%  Weight: 60.8 kg     Height:        Wt Readings from Last 3 Encounters:  04/01/20 60.8 kg  03/10/20 65.3 kg  02/23/20 53.9 kg     Intake/Output Summary (Last 24 hours) at 04/01/2020 1401 Last data filed at 04/01/2020 0630 Gross per 24 hour  Intake --  Output 1125 ml  Net -1125 ml   Physical exam More awake and alert today.  Still confused. HEENT: Moist mucosa Chest: Clear bilaterally CVs: Normal S1-S2 GI: Soft, nontender, nondistended, Foley + Musculoskeletal: Warm, no edema       Data Review:    CBC Recent Labs  Lab 03/26/20 1139 03/27/20 0536 03/27/20 2241 04/01/20 0828  WBC 6.3 7.2 9.6 4.1  HGB 9.8* 10.4* 10.4* 9.7*  HCT 30.5* 32.1* 32.9* 30.0*  PLT 72* 89* 90* 86*  MCV 96.8 97.3 97.9 95.8  MCH 31.1 31.5 31.0 31.0  MCHC 32.1 32.4 31.6 32.3  RDW 15.7* 15.5 15.8* 15.5    Chemistries  Recent Labs  Lab 03/26/20 1139 03/26/20 1139 03/27/20 0536 03/27/20 0536 03/27/20 2241 03/28/20 0553 03/28/20 1709 03/28/20 1709 03/28/20 2222 03/29/20 0548 03/29/20 1232 03/29/20 1653 03/30/20 0427 04/01/20 0828  NA 141   < > 142   < > 140   < > 140  --  139 138 139  --   --  139  K 4.3   < > 3.9   < > 3.5   < > 4.0  --  4.0 3.7 3.9  --   --  3.9  CL 112*   < > 116*   < > 110   < > 107  --  108 108 108  --   --  108  CO2 22   < > 20*   < > 17*   < > 25  --  _0 --   --  23  GLUCOSE 142*   < > 117*   < > 151*   < > 117*  --  121* 153* 120*  --   --  84  BUN 15   < > 14   < > 17   < > 15  --   _1 --   --  12  CREATININE 0.65   < > 0.63   < > 0.57   < > 0.54  --  0.57 0.68 0.55  --   --  0.46  CALCIUM 9.1   < > 9.0   < > 9.0   < > 8.3*  --  8.3* 8.3* 8.3*  --   --  8.7*  MG  --   --   --    < > 1.7  --  2.1   < > 1.9 1.9  --  1.8 1.9 1.8  AST 32  --  40  --  48*  --   --   --   --   --   --   --   --   --   ALT 19  --  24  --  24  --   --   --   --   --   --   --   --   --   ALKPHOS 67  --  68  --  71  --   --   --   --   --   --   --   --   --   BILITOT 2.8*  --  5.0*  --  6.5*  --   --   --   --   --   --   --   --   --    < > = values in this interval not displayed.   ------------------------------------------------------------------------------------------------------------------ No results for input(s): CHOL, HDL, LDLCALC, TRIG, CHOLHDL, LDLDIRECT in the last 72 hours.  Lab Results  Component Value Date   HGBA1C 4.7 (L) 02/18/2020   ------------------------------------------------------------------------------------------------------------------ No results for input(s): TSH, T4TOTAL, T3FREE, THYROIDAB in the last 72 hours.  Invalid input(s): FREET3 ------------------------------------------------------------------------------------------------------------------ No results for input(s): VITAMINB12, FOLATE, FERRITIN, TIBC, IRON, RETICCTPCT in the last 72 hours.  Coagulation profile No results for input(s): INR, PROTIME in the last 168 hours.  No results for input(s): DDIMER in the last 72 hours.  Cardiac Enzymes No results for input(s): CKMB, TROPONINI, MYOGLOBIN in the last 168 hours.  Invalid input(s): CK ------------------------------------------------------------------------------------------------------------------ No results found for: BNP  Inpatient Medications  Scheduled Meds: . chlorhexidine  15 mL Mouth Rinse BID  . Chlorhexidine Gluconate Cloth  6 each Topical Daily  . folic acid  1 mg Intravenous Daily  . lactulose  30 g Per Tube BID  . mouth  rinse  15 mL Mouth Rinse QID  . pantoprazole sodium  40 mg Per Tube Daily  . rifaximin  550 mg  Per Tube BID   Continuous Infusions: . feeding supplement (JEVITY 1.5 CAL/FIBER) 1,000 mL (03/30/20 2205)   PRN Meds:.haloperidol lactate, hydrALAZINE, ondansetron (ZOFRAN) IV, sodium chloride  Micro Results Recent Results (from the past 240 hour(s))  Urine Culture     Status: None   Collection Time: 03/26/20 11:39 AM   Specimen: Urine, Catheterized  Result Value Ref Melgoza Status   Specimen Description   Final    URINE, CATHETERIZED Performed at [facility], [address], [place], [place] [address]    Special Requests   Final    NONE Performed at [facility], [address], [place], [place] [address]    Culture   Final    NO GROWTH Performed at [facility], [place] [address], [place], [place] [address]    Report Status 03/27/2020 FINAL  Final  SARS CORONAVIRUS 2 (TAT 6-24 HRS) Nasopharyngeal Nasopharyngeal Swab     Status: None   Collection Time: 03/26/20 12:03 PM   Specimen: Nasopharyngeal Swab  Result Value Ref Carberry Status   SARS Coronavirus 2 NEGATIVE NEGATIVE Final    Comment: (NOTE) SARS-CoV-2 target nucleic acids are NOT DETECTED. The SARS-CoV-2 RNA is generally detectable in upper and lower respiratory specimens during the acute phase of infection. Negative results do not preclude SARS-CoV-2 infection, do not rule out co-infections with other pathogens, and should not be used as the sole basis for treatment or other patient management decisions. Negative results must be combined with clinical observations, patient history, and epidemiological information. The expected result is Negative. Fact Sheet for Patients: SugarRoll.be Fact Sheet for Healthcare Providers: https://www.woods-mathews.com/ This test is not yet approved or cleared by the [place] FDA and  has been authorized for detection  and/or diagnosis of SARS-CoV-2 by FDA under an Emergency Use Authorization (EUA). This EUA will remain  in effect (meaning this test can be used) for the duration of the COVID-19 declaration under Section 56 4(b)(1) of the Act, 21 U.S.C. section 360bbb-3(b)(1), unless the authorization is terminated or revoked sooner. Performed at [facility], [place] [address], [place], [place] [address]   CULTURE, BLOOD (ROUTINE X 2) w Reflex to ID Panel     Status: None   Collection Time: 03/26/20  1:23 PM   Specimen: BLOOD  Result Value Ref Saulsbury Status   Specimen Description BLOOD LEFT ANTECUBITAL  Final   Special Requests   Final    BOTTLES DRAWN AEROBIC AND ANAEROBIC Blood Culture adequate volume   Culture   Final    NO GROWTH 5 DAYS Performed at [facility], [address] [place], [place] [address]    Report Status 03/31/2020 FINAL  Final  CULTURE, BLOOD (ROUTINE X 2) w Reflex to ID Panel     Status: None   Collection Time: 03/26/20  1:23 PM   Specimen: BLOOD  Result Value Ref Nickson Status   Specimen Description BLOOD BLOOD LEFT HAND  Final   Special Requests   Final    BOTTLES DRAWN AEROBIC AND ANAEROBIC Blood Culture adequate volume   Culture   Final    NO GROWTH 5 DAYS Performed at [facility], [address] [place],  [address]    Report Status 03/31/2020 FINAL  Final  CULTURE, BLOOD (ROUTINE X 2) w Reflex to ID Panel     Status: None (Preliminary result)   Collection Time: 03/28/20  2:44 AM   Specimen: BLOOD  Result Value Ref Leverich Status   Specimen  Description BLOOD LEFT HAND  Final   Special Requests   Final    BOTTLES DRAWN AEROBIC AND ANAEROBIC Blood Culture adequate volume   Culture   Final    NO GROWTH 4 DAYS Performed at [facility], [address] [place], [place] [address]    Report Status PENDING  Incomplete  CULTURE, BLOOD (ROUTINE X 2) w Reflex to ID Panel     Status: None (Preliminary result)   Collection Time:  03/28/20  2:56 AM   Specimen: BLOOD  Result Value Ref Chabot Status   Specimen Description BLOOD RIGHT AC  Final   Special Requests   Final    BOTTLES DRAWN AEROBIC AND ANAEROBIC Blood Culture results may not be optimal due to an inadequate volume of blood received in culture bottles   Culture   Final    NO GROWTH 4 DAYS Performed at [facility], [address] [place], [place] [address]    Report Status PENDING  Incomplete  SARS CORONAVIRUS 2 (TAT 6-24 HRS) Nasopharyngeal Nasopharyngeal Swab     Status: None   Collection Time: 03/29/20  3:47 PM   Specimen: Nasopharyngeal Swab  Result Value Ref Losee Status   SARS Coronavirus 2 NEGATIVE NEGATIVE Final    Comment: (NOTE) SARS-CoV-2 target nucleic acids are NOT DETECTED. The SARS-CoV-2 RNA is generally detectable in upper and lower respiratory specimens during the acute phase of infection. Negative results do not preclude SARS-CoV-2 infection, do not rule out co-infections with other pathogens, and should not be used as the sole basis for treatment or other patient management decisions. Negative results must be combined with clinical observations, patient history, and epidemiological information. The expected result is Negative. Fact Sheet for Patients: SugarRoll.be Fact Sheet for Healthcare Providers: https://www.woods-mathews.com/ This test is not yet approved or cleared by the [place] FDA and  has been authorized for detection and/or diagnosis of SARS-CoV-2 by FDA under an Emergency Use Authorization (EUA). This EUA will remain  in effect (meaning this test can be used) for the duration of the COVID-19 declaration under Section 56 4(b)(1) of the Act, 21 U.S.C. section 360bbb-3(b)(1), unless the authorization is terminated or revoked sooner. Performed at [facility], [place] [address], [place], [place] [address]     Radiology Reports DG Chest 1 View  Result  Date: 03/28/2020 CLINICAL DATA:  Check nasogastric catheter placement EXAM: CHEST  1 VIEW COMPARISON:  03/28/2019 FINDINGS: Cardiac shadow is stable. Gastric catheter is noted extending into the stomach and subsequently into the second portion of the duodenum. Patchy atelectatic changes are noted in the bases bilaterally. IMPRESSION: Bibasilar atelectasis increased from the prior exam. Gastric catheter within the duodenum. Electronically Signed   By: Inez Catalina M.D.   On: 03/28/2020 23:00   DG Chest 1 View  Result Date: 03/27/2020 CLINICAL DATA:  Cough EXAM: CHEST  1 VIEW COMPARISON:  March 24, 2020 FINDINGS: The heart size and mediastinal contours are within normal limits. Both lungs are clear. The visualized skeletal structures are unremarkable. IMPRESSION: No active disease. Electronically Signed   By: Constance Holster M.D.   On: 03/27/2020 22:13   DG Chest 2 View  Addendum Date: 03/25/2020   ADDENDUM REPORT: 03/25/2020 10:15 ADDENDUM: No evidence to suggest active TB infection. No changes to the Findings section of the report. Electronically Signed   By: Davina Poke D.O.   On: 03/25/2020 10:15   Result Date: 03/25/2020 CLINICAL DATA:  Smoking history, hypertension EXAM: CHEST -  2 VIEW COMPARISON:  03/19/2020 FINDINGS: The heart size and mediastinal contours are stable. Atherosclerotic calcification of the aortic knob. No focal airspace consolidation, pleural effusion, or pneumothorax. The visualized skeletal structures are unremarkable. IMPRESSION: No active cardiopulmonary disease. Electronically Signed: By: Davina Poke D.O. On: 03/24/2020 16:39   CT Head Wo Contrast  Result Date: 03/19/2020 CLINICAL DATA:  Altered mental status, confusion, recent ETOH detox EXAM: CT HEAD WITHOUT CONTRAST TECHNIQUE: Contiguous axial images were obtained from the base of the skull through the vertex without intravenous contrast. COMPARISON:  CT head 02/18/2020 FINDINGS: Brain: No evidence of acute  infarction, hemorrhage, hydrocephalus, extra-axial collection or mass lesion/mass effect. Symmetric prominence of the ventricles, cisterns and sulci compatible with parenchymal volume loss. Patchy areas of white matter hypoattenuation are most compatible with chronic microvascular angiopathy. Mild hyperattenuation of the globus pallidi may reflect senescent mineralization similar to prior exams. Vascular: Atherosclerotic calcification of the carotid siphons. No hyperdense vessel. Skull: Motion at the level of the skull base. No visible calvarial fracture or suspicious osseous lesion. No scalp swelling or hematoma. Sinuses/Orbits: Paranasal sinuses and mastoid air cells are predominantly clear. Included orbital structures are unremarkable. Other: Likely chronic deformity of the nasal bones. IMPRESSION: No acute intracranial abnormality. Mild chronic microvascular angiopathy and parenchymal volume loss similar to comparison. Motion degradation may limit evaluation of the skull base and posterior fossa. Electronically Signed   By: Lovena Le M.D.   On: 03/19/2020 23:40   CT ABDOMEN PELVIS W CONTRAST  Result Date: 03/20/2020 CLINICAL DATA:  Abdominal distension, possibly Wernicke-Korsakoff syndrome EXAM: CT ABDOMEN AND PELVIS WITH CONTRAST TECHNIQUE: Multidetector CT imaging of the abdomen and pelvis was performed using the standard protocol following bolus administration of intravenous contrast. CONTRAST:  83m OMNIPAQUE IOHEXOL 300 MG/ML  SOLN COMPARISON:  None. FINDINGS: Lower chest: Atelectatic changes are present in the lung bases. No consolidation or effusion. Cardiac size is upper limits normal. Extensive coronary artery calcifications are present. No pericardial effusion. Hepatobiliary: Slightly nodular hepatic surface contour difficult to fully assess given patient respiratory motion artifact. No focal liver lesion. No visible calcified gallstones or biliary ductal dilatation. Pancreas: Unremarkable. No  pancreatic ductal dilatation or surrounding inflammatory changes. Spleen: Normal in size without focal abnormality. Adrenals/Urinary Tract: Normal adrenal glands. Questionable cyst arising from the upper pole left kidney though difficult to fully assess given extensive respiratory motion artifact. No other focal renal lesions. Mild bilateral symmetric perinephric stranding, a nonspecific finding though may correlate with either age or decreased renal function. No urolithiasis or hydronephrosis. Urinary bladder is unremarkable. Stomach/Bowel: Mild edematous mural thickening of the stomach and duodenum. No small bowel dilatation. The appendix is poorly visualized. Diffuse mild colonic mural thickening more pronounced towards the descending and sigmoid colon. Scattered colonic diverticula without focal diverticular inflammation to suggest acute diverticulitis. Vascular/Lymphatic: Atherosclerotic plaque within the normal caliber aorta. Some congested mid mesenteric nodes are noted. No pathologically enlarged nodes in the abdomen or pelvis. Reproductive: Slightly retroverted uterus with some questionable endometrial thickening measuring up to 11 mm. No concerning adnexal lesions are seen. Other: Circumferential mild body wall edema as well as edematous changes in the central mesentery. Small volume ascites noted in the right subdiaphragmatic space. No bowel containing hernias. Small fat containing left inguinal hernia. Musculoskeletal: Multilevel degenerative changes are present in the imaged portions of the spine. No acute osseous abnormality or suspicious osseous lesion. IMPRESSION: 1. Nodular hepatic surface contour, such finding may be seen in the setting of intrinsic liver disease/cirrhosis. 2.  Diffuse edematous changes of the stomach, colon and small bowel. While these could feasibly be related to an enterocolitis, finding would be more nonspecific in the setting intrinsic liver as portal Freddrick March  neuropathy/colopathy could have a similar appearance. 3. Small volume ascites in the right subdiaphragmatic space. Could be related to possible liver disease or anasarca given circumferential body wall edema and mesenteric edema. 4. Questionable endometrial thickening measuring up to 11 mm. Recommend nonemergent, outpatient pelvic ultrasound for further evaluation. 5. Extensive coronary artery calcifications. 6. Small fat containing left inguinal hernia. 7. Aortic Atherosclerosis (ICD10-I70.0). Electronically Signed   By: Lovena Le M.D.   On: 03/20/2020 01:26   DG Chest Port 1 View  Result Date: 03/30/2020 CLINICAL DATA:  NG tube placement EXAM: PORTABLE CHEST 1 VIEW COMPARISON:  01/31/2020, 3:17 p.m. FINDINGS: Interval advancement of esophagogastric tube, tip and side port below the diaphragm. Cardiomegaly. No acute abnormality of the lungs. IMPRESSION: Interval advancement of esophagogastric tube, tip and side port below the diaphragm. Electronically Signed   By: Eddie Candle M.D.   On: 03/30/2020 16:42   DG Chest Port 1 View  Result Date: 03/30/2020 CLINICAL DATA:  NG TUBE. Hx of dm, htn EXAM: PORTABLE CHEST 1 VIEW COMPARISON:  Chest radiograph 03/28/2020 FINDINGS: The nasogastric tube tip appears to be within the upper esophagus. Stable cardiomediastinal contours. Low lung volumes. Improved aeration of the bilateral lung bases. No pneumothorax or significant pleural effusion. No acute finding in the visualized skeleton. IMPRESSION: Nasogastric tube tip appears to be within the upper esophagus. Recommend removal or repositioning into the stomach. Electronically Signed   By: Audie Pinto M.D.   On: 03/30/2020 15:30   DG Chest Port 1 View  Result Date: 03/19/2020 CLINICAL DATA:  Altered mental status EXAM: PORTABLE CHEST 1 VIEW COMPARISON:  None. FINDINGS: Low lung volumes with streaky opacities favoring atelectasis. No pneumothorax or effusion. No convincing features of edema. Slight prominence  of the cardiac silhouette possibly related to portable technique. The aorta is calcified. The remaining cardiomediastinal contours are unremarkable. No acute osseous or soft tissue abnormality. Degenerative changes are present in the imaged spine and shoulders. Telemetry leads overlie the chest. IMPRESSION: Streaky basilar opacities favor atelectasis in the setting of low lung volumes. Aortic Atherosclerosis (ICD10-I70.0). Electronically Signed   By: Lovena Le M.D.   On: 03/19/2020 22:38    Time Spent in minutes 25   Rosey Eide M.D on 04/01/2020 at 2:01 PM  Between 7am to 7pm - Pager - [phone]  After 7pm go to [email or web address] - password [facility]  [facility] -  Office  [phone]

## 2020-04-01 NOTE — Progress Notes (Signed)
Daily Progress Note   Patient Name: Sheila Bowman       Date: 04/01/2020 DOB: 18-May-1951  Age: 69 y.o. MRN#: [id number] Attending Physician: Louellen Molder, MD Primary Care Physician: [place] Date: 03/19/2020  Reason for Consultation/Follow-up: Establishing goals of care  Subjective: Patient awake and talkative today - still with significant confusion but able to participate in conversation. Nursing staff report intermittent agitation  Length of Stay: 6  Current Medications: Scheduled Meds:  . chlorhexidine  15 mL Mouth Rinse BID  . Chlorhexidine Gluconate Cloth  6 each Topical Daily  . folic acid  1 mg Intravenous Daily  . lactulose  30 g Per Tube BID  . mouth rinse  15 mL Mouth Rinse QID  . pantoprazole sodium  40 mg Per Tube Daily  . rifaximin  550 mg Per Tube BID    Continuous Infusions: . feeding supplement (JEVITY 1.5 CAL/FIBER) 1,000 mL (03/30/20 2205)    PRN Meds: haloperidol lactate, hydrALAZINE, ondansetron (ZOFRAN) IV, sodium chloride  Physical Exam Constitutional:      General: She is not in acute distress. Cardiovascular:     Rate and Rhythm: Normal rate and regular rhythm.  Pulmonary:     Effort: Pulmonary effort is normal. No respiratory distress.     Breath sounds: Normal breath sounds.  Musculoskeletal:     Right lower leg: No edema.     Left lower leg: No edema.  Skin:    General: Skin is warm and dry.  Neurological:     Mental Status: She is alert.     Comments: Though confused, is oriented to self and place - to situation somewhat, disoriented to time             Vital Signs: BP (!) 142/65 (BP Location: Left Arm)   Pulse 82   Temp 99 F (37.2 C) (Oral)   Resp 18   Ht 5' 5.51" (1.664 m)   Wt 60.8 kg   SpO2 96%   BMI 21.95  kg/m  SpO2: SpO2: 96 % O2 Device: O2 Device: Room Air O2 Flow Rate:    Intake/output summary:   Intake/Output Summary (Last 24 hours) at 04/01/2020 1104 Last data filed at 04/01/2020 6301 Gross per 24 hour  Intake -  Output 1125 ml  Net -1125 ml   LBM: Last BM Date: 03/30/20 Baseline Weight: Weight: 61.3 kg Most recent weight: Weight: 60.8 kg       Palliative Assessment/Data: PPS 40%    Flowsheet Rows     Most Recent Value  Intake Tab  Referral Department  Hospitalist  Unit at Time of Referral  Cardiac/Telemetry Unit  Palliative Care Primary Diagnosis  Neurology  Date Notified  03/30/20  Palliative Care Type  New Palliative care  Reason for referral  Clarify Goals of Care  Date of Admission  03/19/20  Date first seen by Palliative Care  03/31/20  # of days Palliative referral response time  1 Day(s)  # of days IP prior to Palliative referral  11  Clinical Assessment  Palliative Performance Scale Score  10%  Psychosocial & Spiritual Assessment  Palliative Care Outcomes  Patient/Family meeting held?  Yes  Who was  at the meeting?  daughter  Palliative Care Outcomes  Clarified goals of care, Counseled regarding hospice, Transitioned to hospice, Provided psychosocial or spiritual support  Patient/Family wishes: Interventions discontinued/not started   Tube feedings/TPN      Patient Active Problem List   Diagnosis Date Noted  . FTT (failure to thrive) in adult   . Acute hepatic encephalopathy 03/26/2020  . Wernicke-Korsakoff syndrome (alcoholic) (HCC) 03/26/2020  . Normocytic anemia   . Elevated troponin   . Alcohol abuse   . Cirrhosis (HCC)   . Goals of care, counseling/discussion   . DNR (do not resuscitate)   . Palliative care by specialist   . Failure to thrive in adult   . Acute metabolic encephalopathy 02/18/2020  . 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 Alcohol abuse with alcohol-induced disorder (HCC) 02/18/2020  . Hypokalemia  02/18/2020  . Type 2 diabetes mellitus without complication (HCC) 02/18/2020  . Essential hypertension 02/18/2020    Palliative Care Assessment & Plan   HPI: 69 y.o. female  with past medical history of HTN, ETOH use, possible Wernicke's Korsakoff syndrome, and dementia admitted on 03/19/2020 with AMS. Improved some during admission then worsened again 3/26. Ammonia levels increasing to 144. She was admitted for encephalopathy with ongoing episodes of agitation and somnolence. She pulled out her NG 4/1. Admitted 1 month ago for same. Also in ED a few weeks ago for delirium. PMT consulted for GOC.   Assessment: Patient awake and talkative this morning. Did great with SLP eval - started on soft diet.   Patient is focused on leaving hospital. She is unaware of hospital course - discussed events of hospitalizations, discussed need for temporary feeding tube - she is shocked.  She tells me "I think I only have about 10 days left to live". Discussed this is hard to determine but I felt she had more time than that.  Discussed plan of going to ALF with hospice support - she seems to be agreeable to this plan although I do not feel she has capacity to make her own medical decisions. Throughout conversation she discusses many topics that do not make sense or are unrelated to our conversation.   Called daughter [name] and provided update. [name] continues to agree with plan for ALF and hospice support. [name] asks about prognosis - discussed this is difficult to determine given patient's fluctuating course however her increasing episodes of somnolence and decline in functional abilities and nutrition are concerning for poorer prognosis. Certainly at risk for acute event.   Recommendations/Plan:  D/c to ALF with hospice to follow  NO artificial feeding  Family educated about plan of care and prognosis  Goals of Care and Additional Recommendations:  Limitations on Scope of Treatment: No Artificial  Feeding  Code Status:  DNR  Prognosis:   Unable to determine  Discharge Planning:  ALF with hospice  Care plan was discussed with Dr, Gonzella Lex, TOC, patient and daughter  Thank you for allowing the Palliative Medicine Team to assist in the care of this patient.   Total Time 25 minutes Prolonged Time Billed  no       Greater than 50%  of this time was spent counseling and coordinating care related to the above assessment and plan.  Gerlean Ren, DNP, [facility] Palliative Medicine Team Team Phone # [phone]  Pager [phone]

## 2020-04-02 LAB — CULTURE, BLOOD (ROUTINE X 2)
Culture: NO GROWTH
Culture: NO GROWTH
Special Requests: ADEQUATE

## 2020-04-02 LAB — GLUCOSE, CAPILLARY
Glucose-Capillary: 113 mg/dL — ABNORMAL HIGH (ref 70–99)
Glucose-Capillary: 116 mg/dL — ABNORMAL HIGH (ref 70–99)
Glucose-Capillary: 124 mg/dL — ABNORMAL HIGH (ref 70–99)
Glucose-Capillary: 162 mg/dL — ABNORMAL HIGH (ref 70–99)
Glucose-Capillary: 88 mg/dL (ref 70–99)
Glucose-Capillary: 90 mg/dL (ref 70–99)

## 2020-04-02 MED ORDER — FOLIC ACID 1 MG PO TABS
1.0000 mg | ORAL_TABLET | Freq: Every day | ORAL | Status: DC
  Administered 2020-04-05: 1 mg via ORAL
  Filled 2020-04-02 (×2): qty 1

## 2020-04-02 MED ORDER — LACTULOSE 10 GM/15ML PO SOLN
30.0000 g | Freq: Two times a day (BID) | ORAL | Status: DC
  Administered 2020-04-02 – 2020-04-05 (×6): 30 g via ORAL
  Filled 2020-04-02 (×5): qty 60

## 2020-04-02 MED ORDER — RIFAXIMIN 550 MG PO TABS
550.0000 mg | ORAL_TABLET | Freq: Two times a day (BID) | ORAL | Status: AC
  Administered 2020-04-02: 550 mg via ORAL

## 2020-04-02 MED ORDER — PANTOPRAZOLE SODIUM 40 MG PO PACK
40.0000 mg | PACK | Freq: Every day | ORAL | Status: DC
  Administered 2020-04-02 – 2020-04-05 (×3): 40 mg via ORAL
  Filled 2020-04-02 (×4): qty 20

## 2020-04-02 NOTE — Progress Notes (Signed)
0000 due CBG of 116 failed to transfer over.  0400 due CBG of 88 Checked via [place] NT.

## 2020-04-02 NOTE — Progress Notes (Signed)
Updated patient's daughter on patient's status. Daughter verbalized understanding and had no questions at this time.

## 2020-04-02 NOTE — Progress Notes (Signed)
PROGRESS NOTE                                                                                                                                                                                                             Patient Demographics:    Sheila Bowman, is a 69 y.o. female, DOB - Feb 07, 1951, WUJ:[id number]  Admit date - 03/19/2020   Admitting Physician Ivor Costa, MD  Outpatient Primary MD for the patient is [facility] [place] [facility]  Outpatient Specialists: None  Chief Complaint  Patient presents with  . 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  She was observed in the ED since 3/20 due to altered mental status and hallucination with suspicion for Wernicke's Korsakoff syndrome.  Received folic acid and thiamine in the ED with some improvement but again became worse on 3/26.  She was found to have increasing ammonia level to 144 and admitted for further management of acute metabolic encephalopathy.   Subjective:   Appears most comfortable and communicative today.  Was talking about her son and daughter.  Still disoriented to time and place.  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  Episodes of somnolence and agitation.  More awake and communicative.  Not agitated.  Received 3 days of IV thiamine. Palliative care consult appreciated.  Met with daughter again this admission.  Daughter does not want any feeding tube or PEG.  She understands risk of aspiration but would like to see if patient can take p.o.  She is interested in hospice at ALF if stable.  Plan is for  discharging her to ALF (memory care) with hospice possibly tomorrow if does not require further Air cabin [employer].  Active problems Hypokalemia Replenished.  Normal magnesium.   Type 2 diabetes mellitus without complication N5A of 4.7.  Stable.  Not on home meds.  Essential hypertension Off home oral meds.  As needed hydralazine.  Normocytic anemia Stable.  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  Was discharged home with  her daughter and per her she has not had alcohol since then (greater than 1 month). Patient has chronically elevated total bilirubin and now with hepatic encephalopathy.  Continue lactulose.  ?Seizures  noted for tongue laceration on exam.  She had normal EEG 1 month back. No current issues  ? Endometrial thickening Found incidentally on CT scan.  Outpatient GYN follow-up.      Code Status : DNR  Family Communication  : Daughter involved in care  Disposition Plan: Plan on discharge to ALF with memory care and hospice follow-up possibly tomorrow if remains off safety sitter for 24 hours.  Barriers For Discharge : Encephalopathy with agitation, dysphagia  Consults  : Neurology, palliative care  Procedures  : None  DVT Prophylaxis  : SCDs  Lab Results  Component Value Date   PLT 86 (L) 04/01/2020    Antibiotics  :    Anti-infectives (From admission, onward)   Start     Dose/Rate Route Frequency Ordered Stop   04/02/20 1000  rifaximin (XIFAXAN) tablet 550 mg     550 mg Oral 2 times daily 04/02/20 0827 04/02/20 0933   03/29/20 1000  rifaximin (XIFAXAN) tablet 550 mg  Status:  Discontinued     550 mg Per Tube 2 times daily 03/28/20 2352 04/02/20 0827   03/28/20 2200  rifaximin (XIFAXAN) tablet 550 mg  Status:  Discontinued     550 mg Oral 2 times daily 03/28/20 2017 03/28/20 2352   03/26/20 1400  cefTRIAXone (ROCEPHIN) 1 g in sodium chloride 0.9 % 100 mL IVPB  Status:   Discontinued     1 g 200 mL/hr over 30 Minutes Intravenous Every 24 hours 03/26/20 1304 03/27/20 1335        Objective:   Vitals:   04/01/20 1923 04/01/20 2235 04/02/20 0453 04/02/20 0743  BP: (!) 178/88 140/65  (!) 150/82  Pulse: 88 81  76  Resp: 20 18    Temp: 98.9 F (37.2 C) 99 F (37.2 C)  98.5 F (36.9 C)  TempSrc: Oral   Oral  SpO2:  98%  97%  Weight:   56.9 kg   Height:        Wt Readings from Last 3 Encounters:  04/02/20 56.9 kg  03/10/20 65.3 kg  02/23/20 53.9 kg     Intake/Output Summary (Last 24 hours) at 04/02/2020 1238 Last data filed at 04/02/2020 1000 Gross per 24 hour  Intake 620 ml  Output 202 ml  Net 418 ml   Physical exam Not in distress, alert and communicative.  Pleasant mood HEENT: Moist mucosa Chest: Clear CVs: Normal S1-S2 GI: Soft, nondistended, nontender Musculoskeletal: Warm, no edema CNS: Alert and oriented x1 (to person only)        Data Review:    CBC Recent Labs  Lab 03/27/20 0536 03/27/20 2241 04/01/20 0828  WBC 7.2 9.6 4.1  HGB 10.4* 10.4* 9.7*  HCT 32.1* 32.9* 30.0*  PLT 89* 90* 86*  MCV 97.3 97.9 95.8  MCH 31.5 31.0 31.0  MCHC 32.4 31.6 32.3  RDW 15.5 15.8* 15.5    Chemistries  Recent Labs  Lab 03/27/20 0536 03/27/20 0536 03/27/20 2241 03/28/20 0553 03/28/20 1709 03/28/20 1709 03/28/20 2222 03/29/20 0548 03/29/20 1232 03/29/20 1653 03/30/20 0427 04/01/20 0828  NA 142   < > 140   < > 140  --  139 138 139  --   --  139  K 3.9   < > 3.5   < > 4.0  --  4.0 3.7 3.9  --   --  3.9  CL 116*   < > 110   < > 107  --  108 108 108  --   --  108  CO2 20*   < > 17*   < > 25  --  _0 --   --  23  GLUCOSE 117*   < > 151*   < > 117*  --  121* 153* 120*  --   --  84  BUN 14   < > 17   < > 15  --  _1 --   --  12  CREATININE 0.63   < > 0.57   < > 0.54  --  0.57 0.68 0.55  --   --  0.46  CALCIUM 9.0   < > 9.0   < > 8.3*  --  8.3* 8.3* 8.3*  --   --  8.7*  MG  --    < > 1.7  --  2.1   < > 1.9 1.9  --   1.8 1.9 1.8  AST 40  --  48*  --   --   --   --   --   --   --   --   --   ALT 24  --  24  --   --   --   --   --   --   --   --   --   ALKPHOS 68  --  71  --   --   --   --   --   --   --   --   --   BILITOT 5.0*  --  6.5*  --   --   --   --   --   --   --   --   --    < > = values in this interval not displayed.   ------------------------------------------------------------------------------------------------------------------ No results for input(s): CHOL, HDL, LDLCALC, TRIG, CHOLHDL, LDLDIRECT in the last 72 hours.  Lab Results  Component Value Date   HGBA1C 4.7 (L) 02/18/2020   ------------------------------------------------------------------------------------------------------------------ No results for input(s): TSH, T4TOTAL, T3FREE, THYROIDAB in the last 72 hours.  Invalid input(s): FREET3 ------------------------------------------------------------------------------------------------------------------ No results for input(s): VITAMINB12, FOLATE, FERRITIN, TIBC, IRON, RETICCTPCT in the last 72 hours.  Coagulation profile No results for input(s): INR, PROTIME in the last 168 hours.  No results for input(s): DDIMER in the last 72 hours.  Cardiac Enzymes No results for input(s): CKMB, TROPONINI, MYOGLOBIN in the last 168 hours.  Invalid input(s): CK ------------------------------------------------------------------------------------------------------------------ No results found for: BNP  Inpatient Medications  Scheduled Meds: . folic acid  1 mg Oral Daily  . lactulose  30 g Oral BID  . pantoprazole sodium  40 mg Oral Daily   Continuous Infusions:  PRN Meds:.haloperidol lactate, hydrALAZINE, ondansetron (ZOFRAN) IV, sodium chloride  Micro Results Recent Results (from the past 240 hour(s))  Urine Culture     Status: None   Collection Time: 03/26/20 11:39 AM   Specimen: Urine, Catheterized  Result Value Ref Orman Status   Specimen Description   Final    URINE,  CATHETERIZED Performed at [facility], [address], [place], [place] [address]    Special Requests   Final    NONE Performed at [facility], [address], [place], [place] [address]    Culture   Final    NO GROWTH  Performed at [facility], [place] [address] [place], [place] [address]    Report Status 03/27/2020 FINAL  Final  SARS CORONAVIRUS 2 (TAT 6-24 HRS) Nasopharyngeal Nasopharyngeal Swab     Status: None   Collection Time: 03/26/20 12:03 PM   Specimen: Nasopharyngeal Swab  Result Value Ref O[place] Status   SARS Coronavirus 2 NEGATIVE NEGATIVE Final    Comment: (NOTE) SARS-CoV-2 target nucleic acids are NOT DETECTED. The SARS-CoV-2 RNA is generally detectable in upper and lower respiratory specimens during the acute phase of infection. Negative results do not preclude SARS-CoV-2 infection, do not rule out co-infections with other pathogens, and should not be used as the sole basis for treatment or other patient management decisions. Negative results must be combined with clinical observations, patient history, and epidemiological information. The expected result is Negative. Fact Sheet for Patients: SugarRoll.be Fact Sheet for Healthcare Providers: https://www.woods-mathews.com/ This test is not yet approved or cleared by the [place] FDA and  has been authorized for detection and/or diagnosis of SARS-CoV-2 by FDA under an Emergency Use Authorization (EUA). This EUA will remain  in effect (meaning this test can be used) for the duration of the COVID-19 declaration under Section 56 4(b)(1) of the Act, 21 U.S.C. section 360bbb-3(b)(1), unless the authorization is terminated or revoked sooner. Performed at [facility], [place] [address] [place], [place] [address]   CULTURE, BLOOD (ROUTINE X 2) w Reflex to ID Panel     Status: None   Collection Time: 03/26/20  1:23 PM   Specimen: BLOOD  Result  Value Ref Sessa Status   Specimen Description BLOOD LEFT ANTECUBITAL  Final   Special Requests   Final    BOTTLES DRAWN AEROBIC AND ANAEROBIC Blood Culture adequate volume   Culture   Final    NO GROWTH 5 DAYS Performed at [facility], [address] [place], [place] [address]    Report Status 03/31/2020 FINAL  Final  CULTURE, BLOOD (ROUTINE X 2) w Reflex to ID Panel     Status: None   Collection Time: 03/26/20  1:23 PM   Specimen: BLOOD  Result Value Ref Impson Status   Specimen Description BLOOD BLOOD LEFT HAND  Final   Special Requests   Final    BOTTLES DRAWN AEROBIC AND ANAEROBIC Blood Culture adequate volume   Culture   Final    NO GROWTH 5 DAYS Performed at [facility], [address] [place], [place] [address]    Report Status 03/31/2020 FINAL  Final  CULTURE, BLOOD (ROUTINE X 2) w Reflex to ID Panel     Status: None   Collection Time: 03/28/20  2:44 AM   Specimen: BLOOD  Result Value Ref Favata Status   Specimen Description BLOOD LEFT HAND  Final   Special Requests   Final    BOTTLES DRAWN AEROBIC AND ANAEROBIC Blood Culture adequate volume   Culture   Final    NO GROWTH 5 DAYS Performed at [facility], [address] [place],  [address]    Report Status 04/02/2020 FINAL  Final  CULTURE, BLOOD (ROUTINE X 2) w Reflex to ID Panel     Status: None   Collection Time: 03/28/20  2:56 AM   Specimen: BLOOD  Result Value Ref Averhart Status   Specimen Description BLOOD RIGHT [facility]  Final   Special Requests   Final    BOTTLES DRAWN AEROBIC AND ANAEROBIC Blood Culture results may not be optimal due to  an inadequate volume of blood received in culture bottles   Culture   Final    NO GROWTH 5 DAYS Performed at [facility], [address] [place], [place] [address]    Report Status 04/02/2020 FINAL  Final  SARS CORONAVIRUS 2 (TAT 6-24 HRS) Nasopharyngeal Nasopharyngeal Swab     Status: None   Collection Time: 03/29/20  3:47 PM    Specimen: Nasopharyngeal Swab  Result Value Ref Knock Status   SARS Coronavirus 2 NEGATIVE NEGATIVE Final    Comment: (NOTE) SARS-CoV-2 target nucleic acids are NOT DETECTED. The SARS-CoV-2 RNA is generally detectable in upper and lower respiratory specimens during the acute phase of infection. Negative results do not preclude SARS-CoV-2 infection, do not rule out co-infections with other pathogens, and should not be used as the sole basis for treatment or other patient management decisions. Negative results must be combined with clinical observations, patient history, and epidemiological information. The expected result is Negative. Fact Sheet for Patients: SugarRoll.be Fact Sheet for Healthcare Providers: https://www.woods-mathews.com/ This test is not yet approved or cleared by the [place] FDA and  has been authorized for detection and/or diagnosis of SARS-CoV-2 by FDA under an Emergency Use Authorization (EUA). This EUA will remain  in effect (meaning this test can be used) for the duration of the COVID-19 declaration under Section 56 4(b)(1) of the Act, 21 U.S.C. section 360bbb-3(b)(1), unless the authorization is terminated or revoked sooner. Performed at [facility], [place] [address] [place], [place] [address]     Radiology Reports DG Chest 1 View  Result Date: 03/28/2020 CLINICAL DATA:  Check nasogastric catheter placement EXAM: CHEST  1 VIEW COMPARISON:  03/28/2019 FINDINGS: Cardiac shadow is stable. Gastric catheter is noted extending into the stomach and subsequently into the second portion of the duodenum. Patchy atelectatic changes are noted in the bases bilaterally. IMPRESSION: Bibasilar atelectasis increased from the prior exam. Gastric catheter within the duodenum. Electronically Signed   By: Inez Catalina M.D.   On: 03/28/2020 23:00   DG Chest 1 View  Result Date: 03/27/2020 CLINICAL DATA:  Cough EXAM: CHEST  1  VIEW COMPARISON:  March 24, 2020 FINDINGS: The heart size and mediastinal contours are within normal limits. Both lungs are clear. The visualized skeletal structures are unremarkable. IMPRESSION: No active disease. Electronically Signed   By: Constance Holster M.D.   On: 03/27/2020 22:13   DG Chest 2 View  Addendum Date: 03/25/2020   ADDENDUM REPORT: 03/25/2020 10:15 ADDENDUM: No evidence to suggest active TB infection. No changes to the Findings section of the report. Electronically Signed   By: Davina Poke D.O.   On: 03/25/2020 10:15   Result Date: 03/25/2020 CLINICAL DATA:  Smoking history, hypertension EXAM: CHEST - 2 VIEW COMPARISON:  03/19/2020 FINDINGS: The heart size and mediastinal contours are stable. Atherosclerotic calcification of the aortic knob. No focal airspace consolidation, pleural effusion, or pneumothorax. The visualized skeletal structures are unremarkable. IMPRESSION: No active cardiopulmonary disease. Electronically Signed: By: Davina Poke D.O. On: 03/24/2020 16:39   CT Head Wo Contrast  Result Date: 03/19/2020 CLINICAL DATA:  Altered mental status, confusion, recent ETOH detox EXAM: CT HEAD WITHOUT CONTRAST TECHNIQUE: Contiguous axial images were obtained from the base of the skull through the vertex without intravenous contrast. COMPARISON:  CT head 02/18/2020 FINDINGS: Brain: No evidence of acute infarction, hemorrhage, hydrocephalus, extra-axial collection or mass lesion/mass effect. Symmetric prominence of the ventricles, cisterns and sulci compatible with parenchymal volume loss. Patchy areas  of white matter hypoattenuation are most compatible with chronic microvascular angiopathy. Mild hyperattenuation of the globus pallidi may reflect senescent mineralization similar to prior exams. Vascular: Atherosclerotic calcification of the carotid siphons. No hyperdense vessel. Skull: Motion at the level of the skull base. No visible calvarial fracture or suspicious osseous  lesion. No scalp swelling or hematoma. Sinuses/Orbits: Paranasal sinuses and mastoid air cells are predominantly clear. Included orbital structures are unremarkable. Other: Likely chronic deformity of the nasal bones. IMPRESSION: No acute intracranial abnormality. Mild chronic microvascular angiopathy and parenchymal volume loss similar to comparison. Motion degradation may limit evaluation of the skull base and posterior fossa. Electronically Signed   By: Lovena Le M.D.   On: 03/19/2020 23:40   CT ABDOMEN PELVIS W CONTRAST  Result Date: 03/20/2020 CLINICAL DATA:  Abdominal distension, possibly Wernicke-Korsakoff syndrome EXAM: CT ABDOMEN AND PELVIS WITH CONTRAST TECHNIQUE: Multidetector CT imaging of the abdomen and pelvis was performed using the standard protocol following bolus administration of intravenous contrast. CONTRAST:  63m OMNIPAQUE IOHEXOL 300 MG/ML  SOLN COMPARISON:  None. FINDINGS: Lower chest: Atelectatic changes are present in the lung bases. No consolidation or effusion. Cardiac size is upper limits normal. Extensive coronary artery calcifications are present. No pericardial effusion. Hepatobiliary: Slightly nodular hepatic surface contour difficult to fully assess given patient respiratory motion artifact. No focal liver lesion. No visible calcified gallstones or biliary ductal dilatation. Pancreas: Unremarkable. No pancreatic ductal dilatation or surrounding inflammatory changes. Spleen: Normal in size without focal abnormality. Adrenals/Urinary Tract: Normal adrenal glands. Questionable cyst arising from the upper pole left kidney though difficult to fully assess given extensive respiratory motion artifact. No other focal renal lesions. Mild bilateral symmetric perinephric stranding, a nonspecific finding though may correlate with either age or decreased renal function. No urolithiasis or hydronephrosis. Urinary bladder is unremarkable. Stomach/Bowel: Mild edematous mural thickening of  the stomach and duodenum. No small bowel dilatation. The appendix is poorly visualized. Diffuse mild colonic mural thickening more pronounced towards the descending and sigmoid colon. Scattered colonic diverticula without focal diverticular inflammation to suggest acute diverticulitis. Vascular/Lymphatic: Atherosclerotic plaque within the normal caliber aorta. Some congested mid mesenteric nodes are noted. No pathologically enlarged nodes in the abdomen or pelvis. Reproductive: Slightly retroverted uterus with some questionable endometrial thickening measuring up to 11 mm. No concerning adnexal lesions are seen. Other: Circumferential mild body wall edema as well as edematous changes in the central mesentery. Small volume ascites noted in the right subdiaphragmatic space. No bowel containing hernias. Small fat containing left inguinal hernia. Musculoskeletal: Multilevel degenerative changes are present in the imaged portions of the spine. No acute osseous abnormality or suspicious osseous lesion. IMPRESSION: 1. Nodular hepatic surface contour, such finding may be seen in the setting of intrinsic liver disease/cirrhosis. 2. Diffuse edematous changes of the stomach, colon and small bowel. While these could feasibly be related to an enterocolitis, finding would be more nonspecific in the setting intrinsic liver as portal CFreddrick Marchneuropathy/colopathy could have a similar appearance. 3. Small volume ascites in the right subdiaphragmatic space. Could be related to possible liver disease or anasarca given circumferential body wall edema and mesenteric edema. 4. Questionable endometrial thickening measuring up to 11 mm. Recommend nonemergent, outpatient pelvic ultrasound for further evaluation. 5. Extensive coronary artery calcifications. 6. Small fat containing left inguinal hernia. 7. Aortic Atherosclerosis (ICD10-I70.0). Electronically Signed   By: PLovena LeM.D.   On: 03/20/2020 01:26   DG Chest Port 1  View  Result Date: 03/30/2020 CLINICAL DATA:  NG  tube placement EXAM: PORTABLE CHEST 1 VIEW COMPARISON:  01/31/2020, 3:17 p.m. FINDINGS: Interval advancement of esophagogastric tube, tip and side port below the diaphragm. Cardiomegaly. No acute abnormality of the lungs. IMPRESSION: Interval advancement of esophagogastric tube, tip and side port below the diaphragm. Electronically Signed   By: Eddie Candle M.D.   On: 03/30/2020 16:42   DG Chest Port 1 View  Result Date: 03/30/2020 CLINICAL DATA:  NG TUBE. Hx of dm, htn EXAM: PORTABLE CHEST 1 VIEW COMPARISON:  Chest radiograph 03/28/2020 FINDINGS: The nasogastric tube tip appears to be within the upper esophagus. Stable cardiomediastinal contours. Low lung volumes. Improved aeration of the bilateral lung bases. No pneumothorax or significant pleural effusion. No acute finding in the visualized skeleton. IMPRESSION: Nasogastric tube tip appears to be within the upper esophagus. Recommend removal or repositioning into the stomach. Electronically Signed   By: Audie Pinto M.D.   On: 03/30/2020 15:30   DG Chest Port 1 View  Result Date: 03/19/2020 CLINICAL DATA:  Altered mental status EXAM: PORTABLE CHEST 1 VIEW COMPARISON:  None. FINDINGS: Low lung volumes with streaky opacities favoring atelectasis. No pneumothorax or effusion. No convincing features of edema. Slight prominence of the cardiac silhouette possibly related to portable technique. The aorta is calcified. The remaining cardiomediastinal contours are unremarkable. No acute osseous or soft tissue abnormality. Degenerative changes are present in the imaged spine and shoulders. Telemetry leads overlie the chest. IMPRESSION: Streaky basilar opacities favor atelectasis in the setting of low lung volumes. Aortic Atherosclerosis (ICD10-I70.0). Electronically Signed   By: Lovena Le M.D.   On: 03/19/2020 22:38    Time Spent in minutes 25   Raymonde Hamblin M.D on 04/02/2020 at 12:38 PM  Between  7am to 7pm - Pager - [phone]  After 7pm go to [email or web address] - password [email or web address]  [facility] -  Office  [phone]

## 2020-04-03 LAB — RESPIRATORY PANEL BY RT PCR (FLU A&B, COVID)
Influenza A by PCR: NEGATIVE
Influenza B by PCR: NEGATIVE
SARS Coronavirus 2 by RT PCR: NEGATIVE

## 2020-04-03 LAB — GLUCOSE, CAPILLARY
Glucose-Capillary: 124 mg/dL — ABNORMAL HIGH (ref 70–99)
Glucose-Capillary: 150 mg/dL — ABNORMAL HIGH (ref 70–99)

## 2020-04-03 MED ORDER — THIAMINE HCL 100 MG PO TABS: 50.0000 mg | ORAL_TABLET | Freq: Every day | ORAL | 0 refills | Status: DC

## 2020-04-03 MED ORDER — LACTULOSE 10 GM/15ML PO SOLN: 30.0000 g | Freq: Two times a day (BID) | ORAL | 0 refills | Status: DC

## 2020-04-03 NOTE — Discharge Summary (Signed)
Physician Discharge Summary  Rosalind Guido MBW:[id number] DOB: [birth year] DOA: 03/19/2020  PCP: [facility]  Admit date: 03/19/2020 Discharge date: 04/04/2020  Admitted From: Home Disposition: Assisted living  Recommendations for Outpatient Follow-up:  Follow up with PCP in 1-2 weeks. Patient to be followed up by hospice at ALF.  Home Health: None Equipment/Devices: None  Discharge Condition: Guarded CODE STATUS: DNR Diet recommendation: Mechanical soft with thin liquid.    Discharge Diagnoses:  Principal Problem:   Acute metabolic encephalopathy   Active Problems:   Wernicke-Korsakoff syndrome (alcoholic) (HCC)   Type 2 diabetes mellitus without complication (HCC)   Essential hypertension   Normocytic anemia   Alcohol abuse Alcoholic cirrhosis of liver (HCC)   Acute hepatic encephalopathy   FTT (failure to thrive) in adult   Hospice care  Brief narrative/HPI 69 year old female with hypertension, alcohol abuse with possible Wernicke's Korsakoff syndrome (hospitalized 1 month back), dementia, diet-controlled diabetes who presented with change in mental status.  She was observed in the ED since 3/20 due to altered mental status and hallucination with suspicion for Wernicke's Korsakoff syndrome.  Received folic acid and thiamine in the ED with some improvement but again became worse on 3/26.  She was found to have increasing ammonia level to 144 and admitted for further management of acute metabolic encephalopathy.   Hospital course   Principal Problem:   Acute metabolic encephalopathy Suspected to have Wernicke's Korsakoff syndrome and hepatic encephalopathy with elevated ammonia level.  Episodes of somnolence and agitation.  More awake and communicative past few days and no longer agitated.  Safety sitter discontinued.  Received 3 days of IV thiamine.  Continue daily thiamine and lactulose.  Palliative care consult appreciated.  Met with daughter  again this admission.  Daughter does not want any feeding tube or PEG.  She understands risk of aspiration but would like to see if patient can take p.o.  She is interested in hospice at ALF if stable.  Plan is for discharging her to ALF (memory care) with hospice follow-up.   Active problems Hypokalemia Replenished.     Type 2 diabetes mellitus without complication S1X of 4.7.  Stable.  Not on home meds.  Essential hypertension Was off home oral meds.  Blood pressure now started to elevate and I have placed her back on Benicar.   Normocytic anemia Stable.  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  Was discharged home with her daughter and per her she has not had alcohol since then (greater than 1 month).   ? Endometrial thickening Found incidentally on CT scan.  Outpatient GYN evaluation.      Family Communication  : Daughter involved in care  Disposition Plan:  ALF  Consults  : Neurology, palliative care  Procedures  : None    Discharge Instructions   Allergies as of 04/03/2020   No Known Allergies     Medication List    STOP taking these medications   hydrochlorothiazide 12.5 MG tablet Commonly known as: HYDRODIURIL   megestrol 400 MG/10ML suspension Commonly known as: MEGACE   potassium chloride 10 MEQ tablet Commonly known as: KLOR-CON   QUEtiapine 25 MG tablet Commonly known as: SEROQUEL     TAKE these medications   feeding supplement (ENSURE ENLIVE) Liqd Take 237 mLs by mouth 2 (two) times daily between meals.   folic acid 1 MG tablet Commonly known as: FOLVITE Take 1 tablet (1 mg total) by mouth daily.  lactulose 10 GM/15ML solution Commonly known as: CHRONULAC Take 45 mLs (30 g total) by mouth 2 (two) times daily. What changed:   how much to take  when to take this   multivitamin with minerals Tabs tablet Take 1 tablet by mouth daily.    olmesartan 40 MG tablet Commonly known as: BENICAR Take 1 tablet (40 mg total) by mouth daily.   thiamine 100 MG tablet Take 0.5 tablets (50 mg total) by mouth daily.      Follow-up [place] Follow up in 2 week(s).   Contact information: [facility] [place] [address] [phone]          No Known Allergies    Procedures/Studies: DG Chest 1 View  Result Date: 03/28/2020 CLINICAL DATA:  Check nasogastric catheter placement EXAM: CHEST  1 VIEW COMPARISON:  03/28/2019 FINDINGS: Cardiac shadow is stable. Gastric catheter is noted extending into the stomach and subsequently into the second portion of the duodenum. Patchy atelectatic changes are noted in the bases bilaterally. IMPRESSION: Bibasilar atelectasis increased from the prior exam. Gastric catheter within the duodenum. Electronically Signed   By: Inez Catalina M.D.   On: 03/28/2020 23:00   DG Chest 1 View  Result Date: 03/27/2020 CLINICAL DATA:  Cough EXAM: CHEST  1 VIEW COMPARISON:  March 24, 2020 FINDINGS: The heart size and mediastinal contours are within normal limits. Both lungs are clear. The visualized skeletal structures are unremarkable. IMPRESSION: No active disease. Electronically Signed   By: Constance Holster M.D.   On: 03/27/2020 22:13   DG Chest 2 View  Addendum Date: 03/25/2020   ADDENDUM REPORT: 03/25/2020 10:15 ADDENDUM: No evidence to suggest active TB infection. No changes to the Findings section of the report. Electronically Signed   By: Davina Poke D.O.   On: 03/25/2020 10:15   Result Date: 03/25/2020 CLINICAL DATA:  Smoking history, hypertension EXAM: CHEST - 2 VIEW COMPARISON:  03/19/2020 FINDINGS: The heart size and mediastinal contours are stable. Atherosclerotic calcification of the aortic knob. No focal airspace consolidation, pleural effusion, or pneumothorax. The visualized skeletal structures are unremarkable. IMPRESSION: No  active cardiopulmonary disease. Electronically Signed: By: Davina Poke D.O. On: 03/24/2020 16:39   CT Head Wo Contrast  Result Date: 03/19/2020 CLINICAL DATA:  Altered mental status, confusion, recent ETOH detox EXAM: CT HEAD WITHOUT CONTRAST TECHNIQUE: Contiguous axial images were obtained from the base of the skull through the vertex without intravenous contrast. COMPARISON:  CT head 02/18/2020 FINDINGS: Brain: No evidence of acute infarction, hemorrhage, hydrocephalus, extra-axial collection or mass lesion/mass effect. Symmetric prominence of the ventricles, cisterns and sulci compatible with parenchymal volume loss. Patchy areas of white matter hypoattenuation are most compatible with chronic microvascular angiopathy. Mild hyperattenuation of the globus pallidi may reflect senescent mineralization similar to prior exams. Vascular: Atherosclerotic calcification of the carotid siphons. No hyperdense vessel. Skull: Motion at the level of the skull base. No visible calvarial fracture or suspicious osseous lesion. No scalp swelling or hematoma. Sinuses/Orbits: Paranasal sinuses and mastoid air cells are predominantly clear. Included orbital structures are unremarkable. Other: Likely chronic deformity of the nasal bones. IMPRESSION: No acute intracranial abnormality. Mild chronic microvascular angiopathy and parenchymal volume loss similar to comparison. Motion degradation may limit evaluation of the skull base and posterior fossa. Electronically Signed   By: Lovena Le M.D.   On: 03/19/2020 23:40   CT ABDOMEN PELVIS W CONTRAST  Result Date: 03/20/2020 CLINICAL DATA:  Abdominal  distension, possibly Wernicke-Korsakoff syndrome EXAM: CT ABDOMEN AND PELVIS WITH CONTRAST TECHNIQUE: Multidetector CT imaging of the abdomen and pelvis was performed using the standard protocol following bolus administration of intravenous contrast. CONTRAST:  7m OMNIPAQUE IOHEXOL 300 MG/ML  SOLN COMPARISON:  None. FINDINGS:  Lower chest: Atelectatic changes are present in the lung bases. No consolidation or effusion. Cardiac size is upper limits normal. Extensive coronary artery calcifications are present. No pericardial effusion. Hepatobiliary: Slightly nodular hepatic surface contour difficult to fully assess given patient respiratory motion artifact. No focal liver lesion. No visible calcified gallstones or biliary ductal dilatation. Pancreas: Unremarkable. No pancreatic ductal dilatation or surrounding inflammatory changes. Spleen: Normal in size without focal abnormality. Adrenals/Urinary Tract: Normal adrenal glands. Questionable cyst arising from the upper pole left kidney though difficult to fully assess given extensive respiratory motion artifact. No other focal renal lesions. Mild bilateral symmetric perinephric stranding, a nonspecific finding though may correlate with either age or decreased renal function. No urolithiasis or hydronephrosis. Urinary bladder is unremarkable. Stomach/Bowel: Mild edematous mural thickening of the stomach and duodenum. No small bowel dilatation. The appendix is poorly visualized. Diffuse mild colonic mural thickening more pronounced towards the descending and sigmoid colon. Scattered colonic diverticula without focal diverticular inflammation to suggest acute diverticulitis. Vascular/Lymphatic: Atherosclerotic plaque within the normal caliber aorta. Some congested mid mesenteric nodes are noted. No pathologically enlarged nodes in the abdomen or pelvis. Reproductive: Slightly retroverted uterus with some questionable endometrial thickening measuring up to 11 mm. No concerning adnexal lesions are seen. Other: Circumferential mild body wall edema as well as edematous changes in the central mesentery. Small volume ascites noted in the right subdiaphragmatic space. No bowel containing hernias. Small fat containing left inguinal hernia. Musculoskeletal: Multilevel degenerative changes are present in  the imaged portions of the spine. No acute osseous abnormality or suspicious osseous lesion. IMPRESSION: 1. Nodular hepatic surface contour, such finding may be seen in the setting of intrinsic liver disease/cirrhosis. 2. Diffuse edematous changes of the stomach, colon and small bowel. While these could feasibly be related to an enterocolitis, finding would be more nonspecific in the setting intrinsic liver as portal CFreddrick Marchneuropathy/colopathy could have a similar appearance. 3. Small volume ascites in the right subdiaphragmatic space. Could be related to possible liver disease or anasarca given circumferential body wall edema and mesenteric edema. 4. Questionable endometrial thickening measuring up to 11 mm. Recommend nonemergent, outpatient pelvic ultrasound for further evaluation. 5. Extensive coronary artery calcifications. 6. Small fat containing left inguinal hernia. 7. Aortic Atherosclerosis (ICD10-I70.0). Electronically Signed   By: PLovena LeM.D.   On: 03/20/2020 01:26   DG Chest Port 1 View  Result Date: 03/30/2020 CLINICAL DATA:  NG tube placement EXAM: PORTABLE CHEST 1 VIEW COMPARISON:  01/31/2020, 3:17 p.m. FINDINGS: Interval advancement of esophagogastric tube, tip and side port below the diaphragm. Cardiomegaly. No acute abnormality of the lungs. IMPRESSION: Interval advancement of esophagogastric tube, tip and side port below the diaphragm. Electronically Signed   By: AEddie CandleM.D.   On: 03/30/2020 16:42   DG Chest Port 1 View  Result Date: 03/30/2020 CLINICAL DATA:  NG TUBE. Hx of dm, htn EXAM: PORTABLE CHEST 1 VIEW COMPARISON:  Chest radiograph 03/28/2020 FINDINGS: The nasogastric tube tip appears to be within the upper esophagus. Stable cardiomediastinal contours. Low lung volumes. Improved aeration of the bilateral lung bases. No pneumothorax or significant pleural effusion. No acute finding in the visualized skeleton. IMPRESSION: Nasogastric tube tip appears to be within the  upper  esophagus. Recommend removal or repositioning into the stomach. Electronically Signed   By: Audie Pinto M.D.   On: 03/30/2020 15:30   DG Chest Port 1 View  Result Date: 03/19/2020 CLINICAL DATA:  Altered mental status EXAM: PORTABLE CHEST 1 VIEW COMPARISON:  None. FINDINGS: Low lung volumes with streaky opacities favoring atelectasis. No pneumothorax or effusion. No convincing features of edema. Slight prominence of the cardiac silhouette possibly related to portable technique. The aorta is calcified. The remaining cardiomediastinal contours are unremarkable. No acute osseous or soft tissue abnormality. Degenerative changes are present in the imaged spine and shoulders. Telemetry leads overlie the chest. IMPRESSION: Streaky basilar opacities favor atelectasis in the setting of low lung volumes. Aortic Atherosclerosis (ICD10-I70.0). Electronically Signed   By: Lovena Le M.D.   On: 03/19/2020 22:38       Subjective: No overnight events.  Still confused but not agitated.  Off safety sitter for over 24 hours.  Discharge Exam: Vitals:   04/02/20 2354 04/03/20 0753  BP:  (!) 141/70  Pulse:  79  Resp:  17  Temp: 99.3 F (37.4 C) 98.3 F (36.8 C)  SpO2: 98% 97%   Vitals:   04/02/20 1608 04/02/20 2354 04/03/20 0500 04/03/20 0753  BP: (!) 151/79 134/61  (!) 141/70  Pulse: 80 71  79  Resp:  15  17  Temp: 98.7 F (37.1 C) 99.3 F (37.4 C)  98.3 F (36.8 C)  TempSrc: Oral Oral  Oral  SpO2: 98% 98%  97%  Weight:   123.9 kg   Height:        General: Elderly female not in distress, confused HEENT: Moist mucosa, supple neck Chest: Clear bilaterally CVs: Normal S1-S2 GI: Soft, nondistended, nontender Musculoskeletal: Warm, no edema CNS: Alert and awake, oriented to person only, no tremors    The results of significant diagnostics from this hospitalization (including imaging, microbiology, ancillary and laboratory) are listed below for reference.      Microbiology: Recent Results (from the past 240 hour(s))  Urine Culture     Status: None   Collection Time: 03/26/20 11:39 AM   Specimen: Urine, Catheterized  Result Value Ref Hunley Status   Specimen Description   Final    URINE, CATHETERIZED Performed at [facility], [address], [place], [place] [address]    Special Requests   Final    NONE Performed at [facility], [address], [place], [place] [address]    Culture   Final    NO GROWTH Performed at [facility], [place] [address] [place], [place] [address]    Report Status 03/27/2020 FINAL  Final  SARS CORONAVIRUS 2 (TAT 6-24 HRS) Nasopharyngeal Nasopharyngeal Swab     Status: None   Collection Time: 03/26/20 12:03 PM   Specimen: Nasopharyngeal Swab  Result Value Ref Larsh Status   SARS Coronavirus 2 NEGATIVE NEGATIVE Final    Comment: (NOTE) SARS-CoV-2 target nucleic acids are NOT DETECTED. The SARS-CoV-2 RNA is generally detectable in upper and lower respiratory specimens during the acute phase of infection. Negative results do not preclude SARS-CoV-2 infection, do not rule out co-infections with other pathogens, and should not be used as the sole basis for treatment or other patient management decisions. Negative results must be combined with clinical observations, patient history, and epidemiological information. The expected result is Negative. Fact Sheet for Patients: SugarRoll.be Fact Sheet for Healthcare Providers: https://www.woods-mathews.com/ This test is not yet approved or cleared by the [place] FDA  and  has been authorized for detection and/or diagnosis of SARS-CoV-2 by FDA under an Emergency Use Authorization (EUA). This EUA will remain  in effect (meaning this test can be used) for the duration of the COVID-19 declaration under Section 56 4(b)(1) of the Act, 21 U.S.C. section 360bbb-3(b)(1), unless the authorization is  terminated or revoked sooner. Performed at [facility], [place] [address] [place], [place] [address]   CULTURE, BLOOD (ROUTINE X 2) w Reflex to ID Panel     Status: None   Collection Time: 03/26/20  1:23 PM   Specimen: BLOOD  Result Value Ref Hruska Status   Specimen Description BLOOD LEFT ANTECUBITAL  Final   Special Requests   Final    BOTTLES DRAWN AEROBIC AND ANAEROBIC Blood Culture adequate volume   Culture   Final    NO GROWTH 5 DAYS Performed at [facility], [address] [place], [place] [address]    Report Status 03/31/2020 FINAL  Final  CULTURE, BLOOD (ROUTINE X 2) w Reflex to ID Panel     Status: None   Collection Time: 03/26/20  1:23 PM   Specimen: BLOOD  Result Value Ref Malcomb Status   Specimen Description BLOOD BLOOD LEFT HAND  Final   Special Requests   Final    BOTTLES DRAWN AEROBIC AND ANAEROBIC Blood Culture adequate volume   Culture   Final    NO GROWTH 5 DAYS Performed at [facility], [address] [place], [place] [address]    Report Status 03/31/2020 FINAL  Final  CULTURE, BLOOD (ROUTINE X 2) w Reflex to ID Panel     Status: None   Collection Time: 03/28/20  2:44 AM   Specimen: BLOOD  Result Value Ref Mavis Status   Specimen Description BLOOD LEFT HAND  Final   Special Requests   Final    BOTTLES DRAWN AEROBIC AND ANAEROBIC Blood Culture adequate volume   Culture   Final    NO GROWTH 5 DAYS Performed at [facility], [address] [place], [place] [address]    Report Status 04/02/2020 FINAL  Final  CULTURE, BLOOD (ROUTINE X 2) w Reflex to ID Panel     Status: None   Collection Time: 03/28/20  2:56 AM   Specimen: BLOOD  Result Value Ref Gervais Status   Specimen Description BLOOD RIGHT AC  Final   Special Requests   Final    BOTTLES DRAWN AEROBIC AND ANAEROBIC Blood Culture results may not be optimal due to an inadequate volume of blood received in culture bottles   Culture   Final    NO GROWTH 5  DAYS Performed at [facility], [address] [place], [place] [address]    Report Status 04/02/2020 FINAL  Final  SARS CORONAVIRUS 2 (TAT 6-24 HRS) Nasopharyngeal Nasopharyngeal Swab     Status: None   Collection Time: 03/29/20  3:47 PM   Specimen: Nasopharyngeal Swab  Result Value Ref Suniga Status   SARS Coronavirus 2 NEGATIVE NEGATIVE Final    Comment: (NOTE) SARS-CoV-2 target nucleic acids are NOT DETECTED. The SARS-CoV-2 RNA is generally detectable in upper and lower respiratory specimens during the acute phase of infection. Negative results do not preclude SARS-CoV-2 infection, do not rule out co-infections with other pathogens, and should not be used as the sole basis for treatment or other patient management decisions. Negative results must be combined with clinical observations, patient history, and epidemiological information. The expected result is Negative. Fact  Sheet for Patients: SugarRoll.be Fact Sheet for Healthcare Providers: https://www.woods-mathews.com/ This test is not yet approved or cleared by the [place] FDA and  has been authorized for detection and/or diagnosis of SARS-CoV-2 by FDA under an Emergency Use Authorization (EUA). This EUA will remain  in effect (meaning this test can be used) for the duration of the COVID-19 declaration under Section 56 4(b)(1) of the Act, 21 U.S.C. section 360bbb-3(b)(1), unless the authorization is terminated or revoked sooner. Performed at [facility], [place] [address], [place], [place] [address]      Labs: BNP (last 3 results) No results for input(s): BNP in the last 8760 hours. Basic Metabolic Panel: Recent Labs  Lab 03/28/20 1709 03/28/20 1709 03/28/20 2222 03/29/20 0548 03/29/20 1232 03/29/20 1653 03/30/20 0427 04/01/20 0828  NA 140  --  139 138 139  --   --  139  K 4.0  --  4.0 3.7 3.9  --   --  3.9  CL 107  --  108 108 108  --   --  108   CO2 25  --  '24 25 24  ' --   --  23  GLUCOSE 117*  --  121* 153* 120*  --   --  84  BUN 15  --  '16 18 18  ' --   --  12  CREATININE 0.54  --  0.57 0.68 0.55  --   --  0.46  CALCIUM 8.3*  --  8.3* 8.3* 8.3*  --   --  8.7*  MG 2.1   < > 1.9 1.9  --  1.8 1.9 1.8  PHOS  --   --  3.2 3.1  --  2.7 3.2  --    < > = values in this interval not displayed.   Liver Function Tests: Recent Labs  Lab 03/27/20 2241  AST 48*  ALT 24  ALKPHOS 71  BILITOT 6.5*  PROT 6.6  ALBUMIN 3.2*   No results for input(s): LIPASE, AMYLASE in the last 168 hours. Recent Labs  Lab 03/27/20 2241 03/28/20 0244 03/28/20 2222  AMMONIA 34 49* 36*   CBC: Recent Labs  Lab 03/27/20 2241 04/01/20 0828  WBC 9.6 4.1  HGB 10.4* 9.7*  HCT 32.9* 30.0*  MCV 97.9 95.8  PLT 90* 86*   Cardiac Enzymes: Recent Labs  Lab 03/28/20 0244  CKTOTAL 319*   BNP: Invalid input(s): POCBNP CBG: Recent Labs  Lab 04/02/20 0818 04/02/20 1148 04/02/20 1656 04/02/20 2112 04/03/20 0727  GLUCAP 90 124* 113* 162* 124*   D-Dimer No results for input(s): DDIMER in the last 72 hours. Hgb A1c No results for input(s): HGBA1C in the last 72 hours. Lipid Profile No results for input(s): CHOL, HDL, LDLCALC, TRIG, CHOLHDL, LDLDIRECT in the last 72 hours. Thyroid function studies No results for input(s): TSH, T4TOTAL, T3FREE, THYROIDAB in the last 72 hours.  Invalid input(s): FREET3 Anemia work up No results for input(s): VITAMINB12, FOLATE, FERRITIN, TIBC, IRON, RETICCTPCT in the last 72 hours. Urinalysis    Component Value Date/Time   COLORURINE YELLOW (A) 03/28/2020 0334   APPEARANCEUR CLEAR (A) 03/28/2020 0334   LABSPEC 1.015 03/28/2020 0334   PHURINE 6.0 03/28/2020 0334   GLUCOSEU NEGATIVE 03/28/2020 0334   HGBUR NEGATIVE 03/28/2020 0334   BILIRUBINUR NEGATIVE 03/28/2020 0334   KETONESUR NEGATIVE 03/28/2020 0334   PROTEINUR NEGATIVE 03/28/2020 0334   NITRITE NEGATIVE 03/28/2020 [place]  03/28/2020 0334   Sepsis Labs Invalid  input(s): PROCALCITONIN,  WBC,  LACTICIDVEN Microbiology Recent Results (from the past 240 hour(s))  Urine Culture     Status: None   Collection Time: 03/26/20 11:39 AM   Specimen: Urine, Catheterized  Result Value Ref Bryden Status   Specimen Description   Final    URINE, CATHETERIZED Performed at [facility], [address] [place], [place] [address]    Special Requests   Final    NONE Performed at [facility], [address] [place], [place] [address]    Culture   Final    NO GROWTH Performed at [facility], [place] [address], [place], [place] [address]    Report Status 03/27/2020 FINAL  Final  SARS CORONAVIRUS 2 (TAT 6-24 HRS) Nasopharyngeal Nasopharyngeal Swab     Status: None   Collection Time: 03/26/20 12:03 PM   Specimen: Nasopharyngeal Swab  Result Value Ref Aldava Status   SARS Coronavirus 2 NEGATIVE NEGATIVE Final    Comment: (NOTE) SARS-CoV-2 target nucleic acids are NOT DETECTED. The SARS-CoV-2 RNA is generally detectable in upper and lower respiratory specimens during the acute phase of infection. Negative results do not preclude SARS-CoV-2 infection, do not rule out co-infections with other pathogens, and should not be used as the sole basis for treatment or other patient management decisions. Negative results must be combined with clinical observations, patient history, and epidemiological information. The expected result is Negative. Fact Sheet for Patients: SugarRoll.be Fact Sheet for Healthcare Providers: https://www.woods-mathews.com/ This test is not yet approved or cleared by the [place] FDA and  has been authorized for detection and/or diagnosis of SARS-CoV-2 by FDA under an Emergency Use Authorization (EUA). This EUA will remain  in effect (meaning this test can be used) for the duration of the COVID-19 declaration under Section 56 4(b)(1)  of the Act, 21 U.S.C. section 360bbb-3(b)(1), unless the authorization is terminated or revoked sooner. Performed at [facility], [place] [address], [place], [place] [address]   CULTURE, BLOOD (ROUTINE X 2) w Reflex to ID Panel     Status: None   Collection Time: 03/26/20  1:23 PM   Specimen: BLOOD  Result Value Ref Tuman Status   Specimen Description BLOOD LEFT ANTECUBITAL  Final   Special Requests   Final    BOTTLES DRAWN AEROBIC AND ANAEROBIC Blood Culture adequate volume   Culture   Final    NO GROWTH 5 DAYS Performed at [facility], [address] [place], [place] [address]    Report Status 03/31/2020 FINAL  Final  CULTURE, BLOOD (ROUTINE X 2) w Reflex to ID Panel     Status: None   Collection Time: 03/26/20  1:23 PM   Specimen: BLOOD  Result Value Ref Seawright Status   Specimen Description BLOOD BLOOD LEFT HAND  Final   Special Requests   Final    BOTTLES DRAWN AEROBIC AND ANAEROBIC Blood Culture adequate volume   Culture   Final    NO GROWTH 5 DAYS Performed at [facility], [address] [place], [place] [address]    Report Status 03/31/2020 FINAL  Final  CULTURE, BLOOD (ROUTINE X 2) w Reflex to ID Panel     Status: None   Collection Time: 03/28/20  2:44 AM   Specimen: BLOOD  Result Value Ref [place] Status   Specimen Description BLOOD LEFT HAND  Final   Special Requests   Final    BOTTLES DRAWN AEROBIC AND ANAEROBIC Blood Culture adequate volume  Culture   Final    NO GROWTH 5 DAYS Performed at [facility], [address] [place], [place] [address]    Report Status 04/02/2020 FINAL  Final  CULTURE, BLOOD (ROUTINE X 2) w Reflex to ID Panel     Status: None   Collection Time: 03/28/20  2:56 AM   Specimen: BLOOD  Result Value Ref [place] Status   Specimen Description BLOOD RIGHT [facility]  Final   Special Requests   Final    BOTTLES DRAWN AEROBIC AND ANAEROBIC Blood Culture results may not be optimal due to an inadequate volume of blood  received in culture bottles   Culture   Final    NO GROWTH 5 DAYS Performed at [facility], [address] [place], [place] [address]    Report Status 04/02/2020 FINAL  Final  SARS CORONAVIRUS 2 (TAT 6-24 HRS) Nasopharyngeal Nasopharyngeal Swab     Status: None   Collection Time: 03/29/20  3:47 PM   Specimen: Nasopharyngeal Swab  Result Value Ref Gloria Status   SARS Coronavirus 2 NEGATIVE NEGATIVE Final    Comment: (NOTE) SARS-CoV-2 target nucleic acids are NOT DETECTED. The SARS-CoV-2 RNA is generally detectable in upper and lower respiratory specimens during the acute phase of infection. Negative results do not preclude SARS-CoV-2 infection, do not rule out co-infections with other pathogens, and should not be used as the sole basis for treatment or other patient management decisions. Negative results must be combined with clinical observations, patient history, and epidemiological information. The expected result is Negative. Fact Sheet for Patients: SugarRoll.be Fact Sheet for Healthcare Providers: https://www.woods-mathews.com/ This test is not yet approved or cleared by the [place] FDA and  has been authorized for detection and/or diagnosis of SARS-CoV-2 by FDA under an Emergency Use Authorization (EUA). This EUA will remain  in effect (meaning this test can be used) for the duration of the COVID-19 declaration under Section 56 4(b)(1) of the Act, 21 U.S.C. section 360bbb-3(b)(1), unless the authorization is terminated or revoked sooner. Performed at [facility], [place] [address], [place], [place] [address]      Time coordinating discharge: 35 minutes  SIGNED:   Louellen Molder, MD  [facility] 04/03/2020, 11:33 AM Pager   If 7PM-7AM, please contact night-coverage [email or web address] Password [email or web address]

## 2020-04-04 DIAGNOSIS — K7200 Acute and subacute hepatic failure without coma: Secondary | ICD-10-CM

## 2020-04-04 LAB — GLUCOSE, CAPILLARY
Glucose-Capillary: 82 mg/dL (ref 70–99)
Glucose-Capillary: 192 mg/dL — ABNORMAL HIGH (ref 70–99)

## 2020-04-04 NOTE — TOC Progression Note (Signed)
Transition of Care [facility]) - Progression Note    Patient Details  Name: Sheila Bowman MRN: [id number] Date of Birth: 1951/06/15  Transition of Care [facility]) CM/SW Contact  Trenton Founds, RN Phone Number: 04/04/2020, 11:53 AM  Clinical Narrative:     Noted d/c summary completed yesterday by attending. Contacted bedside nurse who confirmed she thought it was plan for patient to d/c today. Contacted Pharmacist, community at [employer] [facility] [employer] [employer]. She reported that she has just been contacted from patient's daughter about her returning there today with Hospice services. Answered her questions as to patient's current abilities. Completed new FL2 and faxed to facility. Spoke with Mick Sell again who reports that she has spoken with her AD and she will need to come evaluate patient, she reports that she can be here around 2-2:30. Bedside nurse Morrie Sheldon notified.     Expected Discharge Plan: Memory Care Barriers to Discharge: Other (comment)(placement into [name])  Expected Discharge Plan and Services Expected Discharge Plan: Memory Care       Living arrangements for the past 2 months: Single Family Home                                       Social Determinants of Health (SDOH) Interventions    Readmission Risk Interventions No flowsheet data found.

## 2020-04-04 NOTE — NC FL2 (Addendum)
[place] MEDICAID FL2 LEVEL OF CARE SCREENING TOOL     IDENTIFICATION  Patient Name: Brittyn Reinoso Birthdate: 1951-10-26 Sex: female Admission Date (Current Location): 03/19/2020  [place] and [place] Number:  Chiropodist and Address:  [facility], [address], [place], [place] [address]      Provider Number: [id number]  Attending Physician Name and Address:  Arnetha Courser, MD  Relative Name and Phone Number:  Kelly-[phone]    Current Level of Care: Hospital Recommended Level of Care: Memory Care Prior Approval Number:    Date Approved/Denied:   PASRR Number: [id number]  Discharge Plan: Other (Comment)(memory care with hospice)    Current Diagnoses: Patient Active Problem List   Diagnosis Date Noted  . Hospice care   . FTT (failure to thrive) in adult   . Acute hepatic encephalopathy 03/26/2020  . Wernicke-Korsakoff syndrome (alcoholic) (HCC) 03/26/2020  . Normocytic anemia   . Alcohol abuse   . Cirrhosis (HCC)   . Goals of care, counseling/discussion   . DNR (do not resuscitate)   . Palliative care by specialist   . Failure to thrive in adult   . Acute metabolic encephalopathy 02/18/2020  . 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 Alcohol abuse with alcohol-induced disorder (HCC) 02/18/2020  . Hypokalemia 02/18/2020  . Type 2 diabetes mellitus without complication (HCC) 02/18/2020  . Essential hypertension 02/18/2020    Orientation RESPIRATION BLADDER Height & Weight     Self, Time  Normal Continent Weight: 128.1 kg Height:  5' 5.51" (166.4 cm)  BEHAVIORAL SYMPTOMS/MOOD NEUROLOGICAL BOWEL NUTRITION STATUS      Continent Diet(Dysphagia 3)  AMBULATORY STATUS COMMUNICATION OF NEEDS Skin   Supervision Verbally Normal                       Personal Care Assistance Level of Assistance  Bathing, Feeding, Dressing Bathing Assistance: Limited assistance Feeding assistance:  Limited assistance Dressing Assistance: Limited assistance     Functional Limitations Info             SPECIAL CARE FACTORS FREQUENCY                       Contractures Contractures Info: Not present    Additional Factors Info                  Medication List    STOP taking these medications   hydrochlorothiazide 12.5 MG tablet Commonly known as: HYDRODIURIL   megestrol 400 MG/10ML suspension Commonly known as: MEGACE   potassium chloride 10 MEQ tablet Commonly known as: KLOR-CON   QUEtiapine 25 MG tablet Commonly known as: SEROQUEL     TAKE these medications   feeding supplement (ENSURE ENLIVE) Liqd Take 237 mLs by mouth 2 (two) times daily between meals.   folic acid 1 MG tablet Commonly known as: FOLVITE Take 1 tablet (1 mg total) by mouth daily.   lactulose 10 GM/15ML solution Commonly known as: CHRONULAC Take 45 mLs (30 g total) by mouth 2 (two) times daily. What changed:   how much to take  when to take this   multivitamin with minerals Tabs tablet Take 1 tablet by mouth daily.   olmesartan 40 MG tablet Commonly known as: BENICAR Take 1 tablet (40 mg total) by mouth daily.   thiamine 100 MG tablet Take 0.5 tablets (50 mg total) by mouth daily.    Relevant Imaging Results:  Relevant Lab  Results:   Additional Information [phone]  Shelbie Ammons, RN

## 2020-04-04 NOTE — Progress Notes (Signed)
PROGRESS NOTE    Sheila Bowman  MQK:[id number] DOB: 1951-08-16 DOA: 03/19/2020 PCP: [facility]   Brief Narrative:  69 year old female with hypertension, alcohol abuse with possible Wernicke's Korsakoff syndrome (hospitalized 1 month back), dementia, diet-controlled diabetes who presented with change in mental status.  She was observed in the ED since 3/20 due to altered mental status and hallucination with suspicion for Wernicke's Korsakoff syndrome.  Received folic acid and thiamine in the ED with some improvement but again became worse on 3/26.  She was found to have increasing ammonia level to 144 and admitted for further management of acute metabolic encephalopathy.  Subjective: Patient appears comfortable and pleasant.  She was telling me that she is waiting for her girlfriend so they can go out.  Appears to have confabulations.  Assessment & Plan:   Principal Problem:   Acute metabolic encephalopathy Active Problems:   Type 2 diabetes mellitus without complication (HCC)   Essential hypertension   Normocytic anemia   Alcohol abuse   Cirrhosis ([place])   Acute hepatic encephalopathy   Wernicke-Korsakoff syndrome (alcoholic) (HCC)   FTT (failure to thrive) in adult   Hospice care  Acute metabolic encephalopathy Suspected to have Wernicke's Korsakoff syndrome and hepatic encephalopathy with elevated ammonia level.  Episodes of somnolence and agitation.  More awake and communicative.  Not agitated.  Received 3 days of IV thiamine. Palliative care consult appreciated.  Met with daughter again this admission.  Daughter does not want any feeding tube or PEG.  She understands risk of aspiration but would like to see if patient can take p.o.  She is interested in hospice at ALF if stable.  Plan is for discharging her to ALF (memory care) with hospice possibly tomorrow, limited by a facility today who excepted her and will take her tomorrow.  Hypokalemia Replenished.   Normal magnesium.   Type 2 diabetes mellitus without complication A5B of 4.7.  Stable.  Not on home meds.  Essential hypertension.  Goal. Off home oral meds.  As needed hydralazine.  Normocytic anemia Stable.  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  Was discharged home with her daughter and per her she has not had alcohol since then (greater than 1 month). Patient has chronically elevated total bilirubin and now with hepatic encephalopathy.  - Continue lactulose.  ?Seizures  noted for tongue laceration on exam.  She had normal EEG 1 month back. No current issues  ? Endometrial thickening Found incidentally on CT scan.  Outpatient GYN follow-up.  Objective: Vitals:   04/03/20 0753 04/04/20 0016 04/04/20 0500 04/04/20 0758  BP: (!) 141/70 (!) 129/54  133/65  Pulse: 79 73  70  Resp: _0 Temp: 98.3 F (36.8 C) 98.3 F (36.8 C)  97.9 F (36.6 C)  TempSrc: Oral   Oral  SpO2: 97% 96%  97%  Weight:   128.1 kg   Height:        Intake/Output Summary (Last 24 hours) at 04/04/2020 1444 Last data filed at 04/04/2020 1357 Gross per 24 hour  Intake 600 ml  Output --  Net 600 ml   Filed Weights   04/02/20 0453 04/03/20 0500 04/04/20 0500  Weight: 56.9 kg 123.9 kg 128.1 kg    Examination:  General exam: Appears calm and comfortable. Respiratory system: Clear to auscultation. Respiratory effort normal. Cardiovascular system: S1 & S2 heard, RRR. No JVD, murmurs, rubs, gallops or clicks. Gastrointestinal system: Soft,  nontender, nondistended, bowel sounds positive. Central nervous system: Alert and oriented. No focal neurological deficits.Symmetric 5 x 5 power. Extremities: No edema, no cyanosis, pulses intact and symmetrical. Psychiatry: Judgement and insight appear impaired.   DVT prophylaxis: SCDs. Code Status: DNR Family Communication: Daughter was updated. Disposition Plan: We will  go to ALF with memory care unit tomorrow.  Consultants:   G  Palliative care  Procedures:  Antimicrobials:   Data Reviewed: I have personally reviewed following labs and imaging studies  CBC: Recent Labs  Lab 04/01/20 0828  WBC 4.1  HGB 9.7*  HCT 30.0*  MCV 95.8  PLT 86*   Basic Metabolic Panel: Recent Labs  Lab 03/28/20 1709 03/28/20 1709 03/28/20 2222 03/29/20 0548 03/29/20 1232 03/29/20 1653 03/30/20 0427 04/01/20 0828  NA 140  --  139 138 139  --   --  139  K 4.0  --  4.0 3.7 3.9  --   --  3.9  CL 107  --  108 108 108  --   --  108  CO2 25  --  _0 --   --  23  GLUCOSE 117*  --  121* 153* 120*  --   --  84  BUN 15  --  _1 --   --  12  CREATININE 0.54  --  0.57 0.68 0.55  --   --  0.46  CALCIUM 8.3*  --  8.3* 8.3* 8.3*  --   --  8.7*  MG 2.1   < > 1.9 1.9  --  1.8 1.9 1.8  PHOS  --   --  3.2 3.1  --  2.7 3.2  --    < > = values in this interval not displayed.   GFR: Estimated Creatinine Clearance: 91.6 mL/min (by C-G formula based on SCr of 0.46 mg/dL). Liver Function Tests: No results for input(s): AST, ALT, ALKPHOS, BILITOT, PROT, ALBUMIN in the last 168 hours. No results for input(s): LIPASE, AMYLASE in the last 168 hours. Recent Labs  Lab 03/28/20 2222  AMMONIA 36*   Coagulation Profile: No results for input(s): INR, PROTIME in the last 168 hours. Cardiac Enzymes: No results for input(s): CKTOTAL, CKMB, CKMBINDEX, TROPONINI in the last 168 hours. BNP (last 3 results) No results for input(s): PROBNP in the last 8760 hours. HbA1C: No results for input(s): HGBA1C in the last 72 hours. CBG: Recent Labs  Lab 04/02/20 2112 04/03/20 0727 04/03/20 1144 04/04/20 0759 04/04/20 1145  GLUCAP 162* 124* 150* 82 192*   Lipid Profile: No results for input(s): CHOL, HDL, LDLCALC, TRIG, CHOLHDL, LDLDIRECT in the last 72 hours. Thyroid Function Tests: No results for input(s): TSH, T4TOTAL, FREET4, T3FREE, THYROIDAB in the last 72  hours. Anemia Panel: No results for input(s): VITAMINB12, FOLATE, FERRITIN, TIBC, IRON, RETICCTPCT in the last 72 hours. Sepsis Labs: No results for input(s): PROCALCITON, LATICACIDVEN in the last 168 hours.  Recent Results (from the past 240 hour(s))  Urine Culture     Status: None   Collection Time: 03/26/20 11:39 AM   Specimen: Urine, Catheterized  Result Value Ref [place] Status   Specimen Description   Final    URINE, CATHETERIZED Performed at [facility], [address] [place], [place] [address]    Special Requests   Final    NONE Performed at [facility], [address], [place], [place] [address]    Culture   Final    NO GROWTH Performed  at [facility], [place] [address], [place], [place] [address]    Report Status 03/27/2020 FINAL  Final  SARS CORONAVIRUS 2 (TAT 6-24 HRS) Nasopharyngeal Nasopharyngeal Swab     Status: None   Collection Time: 03/26/20 12:03 PM   Specimen: Nasopharyngeal Swab  Result Value Ref [place] Status   SARS Coronavirus 2 NEGATIVE NEGATIVE Final    Comment: (NOTE) SARS-CoV-2 target nucleic acids are NOT DETECTED. The SARS-CoV-2 RNA is generally detectable in upper and lower respiratory specimens during the acute phase of infection. Negative results do not preclude SARS-CoV-2 infection, do not rule out co-infections with other pathogens, and should not be used as the sole basis for treatment or other patient management decisions. Negative results must be combined with clinical observations, patient history, and epidemiological information. The expected result is Negative. Fact Sheet for Patients: SugarRoll.be Fact Sheet for Healthcare Providers: https://www.woods-mathews.com/ This test is not yet approved or cleared by the [place] FDA and  has been authorized for detection and/or diagnosis of SARS-CoV-2 by FDA under an Emergency Use Authorization (EUA). This EUA will remain   in effect (meaning this test can be used) for the duration of the COVID-19 declaration under Section 56 4(b)(1) of the Act, 21 U.S.C. section 360bbb-3(b)(1), unless the authorization is terminated or revoked sooner. Performed at [facility], [place] [address], [place], [place] [address]   CULTURE, BLOOD (ROUTINE X 2) w Reflex to ID Panel     Status: None   Collection Time: 03/26/20  1:23 PM   Specimen: BLOOD  Result Value Ref [place] Status   Specimen Description BLOOD LEFT ANTECUBITAL  Final   Special Requests   Final    BOTTLES DRAWN AEROBIC AND ANAEROBIC Blood Culture adequate volume   Culture   Final    NO GROWTH 5 DAYS Performed at [facility], [address] [place], [place] [address]    Report Status 03/31/2020 FINAL  Final  CULTURE, BLOOD (ROUTINE X 2) w Reflex to ID Panel     Status: None   Collection Time: 03/26/20  1:23 PM   Specimen: BLOOD  Result Value Ref Mccl[place] Status   Specimen Description BLOOD BLOOD LEFT HAND  Final   Special Requests   Final    BOTTLES DRAWN AEROBIC AND ANAEROBIC Blood Culture adequate volume   Culture   Final    NO GROWTH 5 DAYS Performed at [facility], [address] [place], [place] [address]    Report Status 03/31/2020 FINAL  Final  CULTURE, BLOOD (ROUTINE X 2) w Reflex to ID Panel     Status: None   Collection Time: 03/28/20  2:44 AM   Specimen: BLOOD  Result Value Ref [place] Status   Specimen Description BLOOD LEFT HAND  Final   Special Requests   Final    BOTTLES DRAWN AEROBIC AND ANAEROBIC Blood Culture adequate volume   Culture   Final    NO GROWTH 5 DAYS Performed at [facility], [address] [place],  [address]    Report Status 04/02/2020 FINAL  Final  CULTURE, BLOOD (ROUTINE X 2) w Reflex to ID Panel     Status: None   Collection Time: 03/28/20  2:56 AM   Specimen: BLOOD  Result Value Ref [place] Status   Specimen Description BLOOD RIGHT [facility]  Final   Special Requests   Final     BOTTLES DRAWN AEROBIC AND ANAEROBIC Blood Culture results may not be optimal due to  an inadequate volume of blood received in culture bottles   Culture   Final    NO GROWTH 5 DAYS Performed at [facility], [address] [place], [place] [address]    Report Status 04/02/2020 FINAL  Final  SARS CORONAVIRUS 2 (TAT 6-24 HRS) Nasopharyngeal Nasopharyngeal Swab     Status: None   Collection Time: 03/29/20  3:47 PM   Specimen: Nasopharyngeal Swab  Result Value Ref [place] Status   SARS Coronavirus 2 NEGATIVE NEGATIVE Final    Comment: (NOTE) SARS-CoV-2 target nucleic acids are NOT DETECTED. The SARS-CoV-2 RNA is generally detectable in upper and lower respiratory specimens during the acute phase of infection. Negative results do not preclude SARS-CoV-2 infection, do not rule out co-infections with other pathogens, and should not be used as the sole basis for treatment or other patient management decisions. Negative results must be combined with clinical observations, patient history, and epidemiological information. The expected result is Negative. Fact Sheet for Patients: SugarRoll.be Fact Sheet for Healthcare Providers: https://www.woods-mathews.com/ This test is not yet approved or cleared by the [place] FDA and  has been authorized for detection and/or diagnosis of SARS-CoV-2 by FDA under an Emergency Use Authorization (EUA). This EUA will remain  in effect (meaning this test can be used) for the duration of the COVID-19 declaration under Section 56 4(b)(1) of the Act, 21 U.S.C. section 360bbb-3(b)(1), unless the authorization is terminated or revoked sooner. Performed at [facility], [place] [address], [place], [place] [address]   Respiratory Panel by RT PCR (Flu A&B, Covid) - Nasopharyngeal Swab     Status: None   Collection Time: 04/02/20 12:20 PM   Specimen: Nasopharyngeal Swab  Result Value Ref [place] Status   SARS  Coronavirus 2 by RT PCR NEGATIVE NEGATIVE Final    Comment: (NOTE) SARS-CoV-2 target nucleic acids are NOT DETECTED. The SARS-CoV-2 RNA is generally detectable in upper respiratoy specimens during the acute phase of infection. The lowest concentration of SARS-CoV-2 viral copies this assay can detect is 131 copies/mL. A negative result does not preclude SARS-Cov-2 infection and should not be used as the sole basis for treatment or other patient management decisions. A negative result may occur with  improper specimen collection/handling, submission of specimen other than nasopharyngeal swab, presence of viral mutation(s) within the areas targeted by this assay, and inadequate number of viral copies (<131 copies/mL). A negative result must be combined with clinical observations, patient history, and epidemiological information. The expected result is Negative. Fact Sheet for Patients:  PinkCheek.be Fact Sheet for Healthcare Providers:  GravelBags.it This test is not yet ap proved or cleared by the [place] FDA and  has been authorized for detection and/or diagnosis of SARS-CoV-2 by FDA under an Emergency Use Authorization (EUA). This EUA will remain  in effect (meaning this test can be used) for the duration of the COVID-19 declaration under Section 564(b)(1) of the Act, 21 U.S.C. section 360bbb-3(b)(1), unless the authorization is terminated or revoked sooner.    Influenza A by PCR NEGATIVE NEGATIVE Final   Influenza B by PCR NEGATIVE NEGATIVE Final    Comment: (NOTE) The Xpert Xpress SARS-CoV-2/FLU/RSV assay is intended as an aid in  the diagnosis of influenza from Nasopharyngeal swab specimens and  should not be used as a sole basis for treatment. Nasal washings and  aspirates are unacceptable for Xpert Xpress SARS-CoV-2/FLU/RSV  testing. Fact Sheet for Patients: PinkCheek.be Fact Sheet  for Healthcare Providers: GravelBags.it This test is not yet  approved or cleared by the [place] and  has been authorized for detection and/or diagnosis of SARS-CoV-2 by  FDA under an Emergency Use Authorization (EUA). This EUA will remain  in effect (meaning this test can be used) for the duration of the  Covid-19 declaration under Section 564(b)(1) of the Act, 21  U.S.C. section 360bbb-3(b)(1), unless the authorization is  terminated or revoked. Performed at [facility], [address] [place], [place] [address]      Radiology Studies: No results found.  Scheduled Meds: . folic acid  1 mg Oral Daily  . lactulose  30 g Oral BID  . pantoprazole sodium  40 mg Oral Daily   Continuous Infusions:   LOS: 9 days   Time spent: 40 minutes  Lorella Nimrod, MD [facility]  If 7PM-7AM, please contact night-coverage [email or web address]  04/04/2020, 2:44 PM   This record has been created using Systems analyst. Errors have been sought and corrected,but may not always be located. Such creation errors do not reflect on the standard of care.

## 2020-04-04 NOTE — Care Management Important Message (Signed)
Important Message  Patient Details  Name: Sheila Bowman MRN: [id number] Date of Birth: December 31, 1951   Medicare Important Message Given:  Yes     Olegario Messier A Francile Woolford 04/04/2020, 11:30 AM

## 2020-04-05 LAB — GLUCOSE, CAPILLARY: Glucose-Capillary: 86 mg/dL (ref 70–99)

## 2020-04-05 NOTE — Discharge Summary (Signed)
Physician Discharge Summary  Sheila Bowman KLK:[id number] DOB: 10-30-1951 DOA: 03/19/2020  PCP: [facility]  Admit date: 03/19/2020 Discharge date: 04/05/2020  Admitted From: Home Disposition: Assisted living  Recommendations for Outpatient Follow-up:  Follow up with PCP in 1-2 weeks. Patient to be followed up by hospice at ALF.  Home Health: None Equipment/Devices: None  Discharge Condition: Guarded CODE STATUS: DNR Diet recommendation: Mechanical soft with thin liquid.  Discharge Diagnoses:  Principal Problem:   Acute metabolic encephalopathy  Active Problems:   Wernicke-Korsakoff syndrome (alcoholic) (HCC)   Type 2 diabetes mellitus without complication (HCC)   Essential hypertension   Normocytic anemia   Alcohol abuse Alcoholic cirrhosis of liver (HCC)   Acute hepatic encephalopathy   FTT (failure to thrive) in adult   Hospice care  Brief narrative/HPI 69 year old female with hypertension, alcohol abuse with possible Wernicke's Korsakoff syndrome (hospitalized 1 month back), dementia, diet-controlled diabetes who presented with change in mental status.  She was observed in the ED since 3/20 due to altered mental status and hallucination with suspicion for Wernicke's Korsakoff syndrome.  Received folic acid and thiamine in the ED with some improvement but again became worse on 3/26.  She was found to have increasing ammonia level to 144 and admitted for further management of acute metabolic encephalopathy.  Hospital course.    Acute metabolic encephalopathy Suspected to have Wernicke's Korsakoff syndrome and hepatic encephalopathy with elevated ammonia level.  Episodes of somnolence and agitation.  More awake and communicative past few days and no longer agitated.  Safety sitter discontinued.  Received 3 days of IV thiamine.  Continue daily thiamine and lactulose.  Palliative care consult appreciated.  Met with daughter again this admission.  Daughter  does not want any feeding tube or PEG.  She understands risk of aspiration but would like to see if patient can take p.o.  She is interested in hospice at ALF if stable.  Plan is for discharging her to ALF (memory care) with hospice follow-up.  Hypokalemia Replenished.    Type 2 diabetes mellitus without complication P7X of 4.7.  Stable.  Not on home meds.  Essential hypertension Was off home oral meds.  Blood pressure now started to elevate and I have placed her back on Benicar.  Normocytic anemia Stable.  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  Was discharged home with her daughter and per her she has not had alcohol since then (greater than 1 month).  ? Endometrial thickening Found incidentally on CT scan.  Outpatient GYN evaluation.  Family Communication  : Daughter involved in care  Disposition Plan:  ALF  Consults  : Neurology, palliative care  Procedures  : None  Discharge Instructions  Discharge Instructions    Diet - low sodium heart healthy   Complete by: As directed    Increase activity slowly   Complete by: As directed      Allergies as of 04/05/2020   No Known Allergies     Medication List    STOP taking these medications   hydrochlorothiazide 12.5 MG tablet Commonly known as: HYDRODIURIL   megestrol 400 MG/10ML suspension Commonly known as: MEGACE   potassium chloride 10 MEQ tablet Commonly known as: KLOR-CON   QUEtiapine 25 MG tablet Commonly known as: SEROQUEL     TAKE these medications   feeding supplement (ENSURE ENLIVE) Liqd Take 237 mLs by mouth 2 (two) times daily between meals.   folic acid 1 MG  tablet Commonly known as: FOLVITE Take 1 tablet (1 mg total) by mouth daily.   lactulose 10 GM/15ML solution Commonly known as: CHRONULAC Take 45 mLs (30 g total) by mouth 2 (two) times daily. What changed:   how much to take  when to take this    multivitamin with minerals Tabs tablet Take 1 tablet by mouth daily.   olmesartan 40 MG tablet Commonly known as: BENICAR Take 1 tablet (40 mg total) by mouth daily.   thiamine 100 MG tablet Take 0.5 tablets (50 mg total) by mouth daily.      Follow-up [place] Follow up in 2 week(s).   Contact information: [facility] [place] [address] [phone]          No Known Allergies  Procedures/Studies: DG Chest 1 View  Result Date: 03/28/2020 CLINICAL DATA:  Check nasogastric catheter placement EXAM: CHEST  1 VIEW COMPARISON:  03/28/2019 FINDINGS: Cardiac shadow is stable. Gastric catheter is noted extending into the stomach and subsequently into the second portion of the duodenum. Patchy atelectatic changes are noted in the bases bilaterally. IMPRESSION: Bibasilar atelectasis increased from the prior exam. Gastric catheter within the duodenum. Electronically Signed   By: Inez Catalina M.D.   On: 03/28/2020 23:00   DG Chest 1 View  Result Date: 03/27/2020 CLINICAL DATA:  Cough EXAM: CHEST  1 VIEW COMPARISON:  March 24, 2020 FINDINGS: The heart size and mediastinal contours are within normal limits. Both lungs are clear. The visualized skeletal structures are unremarkable. IMPRESSION: No active disease. Electronically Signed   By: Constance Holster M.D.   On: 03/27/2020 22:13   DG Chest 2 View  Addendum Date: 03/25/2020   ADDENDUM REPORT: 03/25/2020 10:15 ADDENDUM: No evidence to suggest active TB infection. No changes to the Findings section of the report. Electronically Signed   By: Davina Poke D.O.   On: 03/25/2020 10:15   Result Date: 03/25/2020 CLINICAL DATA:  Smoking history, hypertension EXAM: CHEST - 2 VIEW COMPARISON:  03/19/2020 FINDINGS: The heart size and mediastinal contours are stable. Atherosclerotic calcification of the aortic knob. No focal airspace consolidation, pleural effusion, or pneumothorax.  The visualized skeletal structures are unremarkable. IMPRESSION: No active cardiopulmonary disease. Electronically Signed: By: Davina Poke D.O. On: 03/24/2020 16:39   CT Head Wo Contrast  Result Date: 03/19/2020 CLINICAL DATA:  Altered mental status, confusion, recent ETOH detox EXAM: CT HEAD WITHOUT CONTRAST TECHNIQUE: Contiguous axial images were obtained from the base of the skull through the vertex without intravenous contrast. COMPARISON:  CT head 02/18/2020 FINDINGS: Brain: No evidence of acute infarction, hemorrhage, hydrocephalus, extra-axial collection or mass lesion/mass effect. Symmetric prominence of the ventricles, cisterns and sulci compatible with parenchymal volume loss. Patchy areas of white matter hypoattenuation are most compatible with chronic microvascular angiopathy. Mild hyperattenuation of the globus pallidi may reflect senescent mineralization similar to prior exams. Vascular: Atherosclerotic calcification of the carotid siphons. No hyperdense vessel. Skull: Motion at the level of the skull base. No visible calvarial fracture or suspicious osseous lesion. No scalp swelling or hematoma. Sinuses/Orbits: Paranasal sinuses and mastoid air cells are predominantly clear. Included orbital structures are unremarkable. Other: Likely chronic deformity of the nasal bones. IMPRESSION: No acute intracranial abnormality. Mild chronic microvascular angiopathy and parenchymal volume loss similar to comparison. Motion degradation may limit evaluation of the skull base and posterior fossa. Electronically Signed   By: Lovena Le M.D.   On: 03/19/2020 23:40  CT ABDOMEN PELVIS W CONTRAST  Result Date: 03/20/2020 CLINICAL DATA:  Abdominal distension, possibly Wernicke-Korsakoff syndrome EXAM: CT ABDOMEN AND PELVIS WITH CONTRAST TECHNIQUE: Multidetector CT imaging of the abdomen and pelvis was performed using the standard protocol following bolus administration of intravenous contrast. CONTRAST:   21m OMNIPAQUE IOHEXOL 300 MG/ML  SOLN COMPARISON:  None. FINDINGS: Lower chest: Atelectatic changes are present in the lung bases. No consolidation or effusion. Cardiac size is upper limits normal. Extensive coronary artery calcifications are present. No pericardial effusion. Hepatobiliary: Slightly nodular hepatic surface contour difficult to fully assess given patient respiratory motion artifact. No focal liver lesion. No visible calcified gallstones or biliary ductal dilatation. Pancreas: Unremarkable. No pancreatic ductal dilatation or surrounding inflammatory changes. Spleen: Normal in size without focal abnormality. Adrenals/Urinary Tract: Normal adrenal glands. Questionable cyst arising from the upper pole left kidney though difficult to fully assess given extensive respiratory motion artifact. No other focal renal lesions. Mild bilateral symmetric perinephric stranding, a nonspecific finding though may correlate with either age or decreased renal function. No urolithiasis or hydronephrosis. Urinary bladder is unremarkable. Stomach/Bowel: Mild edematous mural thickening of the stomach and duodenum. No small bowel dilatation. The appendix is poorly visualized. Diffuse mild colonic mural thickening more pronounced towards the descending and sigmoid colon. Scattered colonic diverticula without focal diverticular inflammation to suggest acute diverticulitis. Vascular/Lymphatic: Atherosclerotic plaque within the normal caliber aorta. Some congested mid mesenteric nodes are noted. No pathologically enlarged nodes in the abdomen or pelvis. Reproductive: Slightly retroverted uterus with some questionable endometrial thickening measuring up to 11 mm. No concerning adnexal lesions are seen. Other: Circumferential mild body wall edema as well as edematous changes in the central mesentery. Small volume ascites noted in the right subdiaphragmatic space. No bowel containing hernias. Small fat containing left inguinal  hernia. Musculoskeletal: Multilevel degenerative changes are present in the imaged portions of the spine. No acute osseous abnormality or suspicious osseous lesion. IMPRESSION: 1. Nodular hepatic surface contour, such finding may be seen in the setting of intrinsic liver disease/cirrhosis. 2. Diffuse edematous changes of the stomach, colon and small bowel. While these could feasibly be related to an enterocolitis, finding would be more nonspecific in the setting intrinsic liver as portal CFreddrick Marchneuropathy/colopathy could have a similar appearance. 3. Small volume ascites in the right subdiaphragmatic space. Could be related to possible liver disease or anasarca given circumferential body wall edema and mesenteric edema. 4. Questionable endometrial thickening measuring up to 11 mm. Recommend nonemergent, outpatient pelvic ultrasound for further evaluation. 5. Extensive coronary artery calcifications. 6. Small fat containing left inguinal hernia. 7. Aortic Atherosclerosis (ICD10-I70.0). Electronically Signed   By: PLovena LeM.D.   On: 03/20/2020 01:26   DG Chest Port 1 View  Result Date: 03/30/2020 CLINICAL DATA:  NG tube placement EXAM: PORTABLE CHEST 1 VIEW COMPARISON:  01/31/2020, 3:17 p.m. FINDINGS: Interval advancement of esophagogastric tube, tip and side port below the diaphragm. Cardiomegaly. No acute abnormality of the lungs. IMPRESSION: Interval advancement of esophagogastric tube, tip and side port below the diaphragm. Electronically Signed   By: AEddie CandleM.D.   On: 03/30/2020 16:42   DG Chest Port 1 View  Result Date: 03/30/2020 CLINICAL DATA:  NG TUBE. Hx of dm, htn EXAM: PORTABLE CHEST 1 VIEW COMPARISON:  Chest radiograph 03/28/2020 FINDINGS: The nasogastric tube tip appears to be within the upper esophagus. Stable cardiomediastinal contours. Low lung volumes. Improved aeration of the bilateral lung bases. No pneumothorax or significant pleural effusion. No acute finding in the  visualized  skeleton. IMPRESSION: Nasogastric tube tip appears to be within the upper esophagus. Recommend removal or repositioning into the stomach. Electronically Signed   By: Audie Pinto M.D.   On: 03/30/2020 15:30   DG Chest Port 1 View  Result Date: 03/19/2020 CLINICAL DATA:  Altered mental status EXAM: PORTABLE CHEST 1 VIEW COMPARISON:  None. FINDINGS: Low lung volumes with streaky opacities favoring atelectasis. No pneumothorax or effusion. No convincing features of edema. Slight prominence of the cardiac silhouette possibly related to portable technique. The aorta is calcified. The remaining cardiomediastinal contours are unremarkable. No acute osseous or soft tissue abnormality. Degenerative changes are present in the imaged spine and shoulders. Telemetry leads overlie the chest. IMPRESSION: Streaky basilar opacities favor atelectasis in the setting of low lung volumes. Aortic Atherosclerosis (ICD10-I70.0). Electronically Signed   By: Lovena Le M.D.   On: 03/19/2020 22:38   Subjective: No over night concerns. Patient remain pleasantly confused.  Discharge Exam: Vitals:   04/04/20 2350 04/05/20 0756  BP: (!) 160/85 (!) 154/66  Pulse: 68 82  Resp:  19  Temp: 98.2 F (36.8 C) 98.2 F (36.8 C)  SpO2: 98% 98%   Vitals:   04/04/20 0758 04/04/20 1606 04/04/20 2350 04/05/20 0756  BP: 133/65 (!) 146/67 (!) 160/85 (!) 154/66  Pulse: 70 76 68 82  Resp: 20   19  Temp: 97.9 F (36.6 C) 99 F (37.2 C) 98.2 F (36.8 C) 98.2 F (36.8 C)  TempSrc: Oral Oral Oral Oral  SpO2: 97% 97% 98% 98%  Weight:      Height:        General: Elderly female not in distress, confused but pleasant. HEENT: Moist mucosa, supple neck Chest: Clear bilaterally CVs: Normal S1-S2 GI: Soft, nondistended, nontender Musculoskeletal: Warm, no edema CNS: Alert and awake, oriented to person only, no tremors  The results of significant diagnostics from this hospitalization (including imaging, microbiology,  ancillary and laboratory) are listed below for reference.     Microbiology: Recent Results (from the past 240 hour(s))  Urine Culture     Status: None   Collection Time: 03/26/20 11:39 AM   Specimen: Urine, Catheterized  Result Value Ref Hagmann Status   Specimen Description   Final    URINE, CATHETERIZED Performed at [facility], [address], [place], [place] [address]    Special Requests   Final    NONE Performed at [facility], [address] [place], [place] [address]    Culture   Final    NO GROWTH Performed at [facility], [place] [address], [place], [place] [address]    Report Status 03/27/2020 FINAL  Final  SARS CORONAVIRUS 2 (TAT 6-24 HRS) Nasopharyngeal Nasopharyngeal Swab     Status: None   Collection Time: 03/26/20 12:03 PM   Specimen: Nasopharyngeal Swab  Result Value Ref Colquitt Status   SARS Coronavirus 2 NEGATIVE NEGATIVE Final    Comment: (NOTE) SARS-CoV-2 target nucleic acids are NOT DETECTED. The SARS-CoV-2 RNA is generally detectable in upper and lower respiratory specimens during the acute phase of infection. Negative results do not preclude SARS-CoV-2 infection, do not rule out co-infections with other pathogens, and should not be used as the sole basis for treatment or other patient management decisions. Negative results must be combined with clinical observations, patient history, and epidemiological information. The expected result is Negative. Fact Sheet for Patients: SugarRoll.be Fact Sheet for Healthcare Providers: https://www.woods-mathews.com/ This test is not yet approved or cleared by  the [employer] and  has been authorized for detection and/or diagnosis of SARS-CoV-2 by FDA under an Emergency Use Authorization (EUA). This EUA will remain  in effect (meaning this test can be used) for the duration of the COVID-19 declaration under Section 56 4(b)(1) of the Act, 21  U.S.C. section 360bbb-3(b)(1), unless the authorization is terminated or revoked sooner. Performed at [facility], [place] [address] [place], [place] [address]   CULTURE, BLOOD (ROUTINE X 2) w Reflex to ID Panel     Status: None   Collection Time: 03/26/20  1:23 PM   Specimen: BLOOD  Result Value Ref Wolfert Status   Specimen Description BLOOD LEFT ANTECUBITAL  Final   Special Requests   Final    BOTTLES DRAWN AEROBIC AND ANAEROBIC Blood Culture adequate volume   Culture   Final    NO GROWTH 5 DAYS Performed at [facility], [address] [place], [place] [address]    Report Status 03/31/2020 FINAL  Final  CULTURE, BLOOD (ROUTINE X 2) w Reflex to ID Panel     Status: None   Collection Time: 03/26/20  1:23 PM   Specimen: BLOOD  Result Value Ref Cowin Status   Specimen Description BLOOD BLOOD LEFT HAND  Final   Special Requests   Final    BOTTLES DRAWN AEROBIC AND ANAEROBIC Blood Culture adequate volume   Culture   Final    NO GROWTH 5 DAYS Performed at [facility], [address] [place], [place] [address]    Report Status 03/31/2020 FINAL  Final  CULTURE, BLOOD (ROUTINE X 2) w Reflex to ID Panel     Status: None   Collection Time: 03/28/20  2:44 AM   Specimen: BLOOD  Result Value Ref Grand Status   Specimen Description BLOOD LEFT HAND  Final   Special Requests   Final    BOTTLES DRAWN AEROBIC AND ANAEROBIC Blood Culture adequate volume   Culture   Final    NO GROWTH 5 DAYS Performed at [facility], [address] [place], [place], [place] [address]    Report Status 04/02/2020 FINAL  Final  CULTURE, BLOOD (ROUTINE X 2) w Reflex to ID Panel     Status: None   Collection Time: 03/28/20  2:56 AM   Specimen: BLOOD  Result Value Ref Pekar Status   Specimen Description BLOOD RIGHT AC  Final   Special Requests   Final    BOTTLES DRAWN AEROBIC AND ANAEROBIC Blood Culture results may not be optimal due to an inadequate volume of blood received in  culture bottles   Culture   Final    NO GROWTH 5 DAYS Performed at [facility], [address] [place], [place] [address]    Report Status 04/02/2020 FINAL  Final  SARS CORONAVIRUS 2 (TAT 6-24 HRS) Nasopharyngeal Nasopharyngeal Swab     Status: None   Collection Time: 03/29/20  3:47 PM   Specimen: Nasopharyngeal Swab  Result Value Ref Graul Status   SARS Coronavirus 2 NEGATIVE NEGATIVE Final    Comment: (NOTE) SARS-CoV-2 target nucleic acids are NOT DETECTED. The SARS-CoV-2 RNA is generally detectable in upper and lower respiratory specimens during the acute phase of infection. Negative results do not preclude SARS-CoV-2 infection, do not rule out co-infections with other pathogens, and should not be used as the sole basis for treatment or other patient management decisions. Negative results must be combined with clinical observations, patient history, and epidemiological information. The expected  result is Negative. Fact Sheet for Patients: SugarRoll.be Fact Sheet for Healthcare Providers: https://www.woods-mathews.com/ This test is not yet approved or cleared by the [place] FDA and  has been authorized for detection and/or diagnosis of SARS-CoV-2 by FDA under an Emergency Use Authorization (EUA). This EUA will remain  in effect (meaning this test can be used) for the duration of the COVID-19 declaration under Section 56 4(b)(1) of the Act, 21 U.S.C. section 360bbb-3(b)(1), unless the authorization is terminated or revoked sooner. Performed at [facility], [place] [address], [place], [place] [address]   Respiratory Panel by RT PCR (Flu A&B, Covid) - Nasopharyngeal Swab     Status: None   Collection Time: 04/02/20 12:20 PM   Specimen: Nasopharyngeal Swab  Result Value Ref Wing Status   SARS Coronavirus 2 by RT PCR NEGATIVE NEGATIVE Final    Comment: (NOTE) SARS-CoV-2 target nucleic acids are NOT DETECTED. The  SARS-CoV-2 RNA is generally detectable in upper respiratoy specimens during the acute phase of infection. The lowest concentration of SARS-CoV-2 viral copies this assay can detect is 131 copies/mL. A negative result does not preclude SARS-Cov-2 infection and should not be used as the sole basis for treatment or other patient management decisions. A negative result may occur with  improper specimen collection/handling, submission of specimen other than nasopharyngeal swab, presence of viral mutation(s) within the areas targeted by this assay, and inadequate number of viral copies (<131 copies/mL). A negative result must be combined with clinical observations, patient history, and epidemiological information. The expected result is Negative. Fact Sheet for Patients:  PinkCheek.be Fact Sheet for Healthcare Providers:  GravelBags.it This test is not yet ap proved or cleared by the [place] FDA and  has been authorized for detection and/or diagnosis of SARS-CoV-2 by FDA under an Emergency Use Authorization (EUA). This EUA will remain  in effect (meaning this test can be used) for the duration of the COVID-19 declaration under Section 564(b)(1) of the Act, 21 U.S.C. section 360bbb-3(b)(1), unless the authorization is terminated or revoked sooner.    Influenza A by PCR NEGATIVE NEGATIVE Final   Influenza B by PCR NEGATIVE NEGATIVE Final    Comment: (NOTE) The Xpert Xpress SARS-CoV-2/FLU/RSV assay is intended as an aid in  the diagnosis of influenza from Nasopharyngeal swab specimens and  should not be used as a sole basis for treatment. Nasal washings and  aspirates are unacceptable for Xpert Xpress SARS-CoV-2/FLU/RSV  testing. Fact Sheet for Patients: PinkCheek.be Fact Sheet for Healthcare Providers: GravelBags.it This test is not yet approved or cleared by the [place] FDA and  has been authorized for detection and/or diagnosis of SARS-CoV-2 by  FDA under an Emergency Use Authorization (EUA). This EUA will remain  in effect (meaning this test can be used) for the duration of the  Covid-19 declaration under Section 564(b)(1) of the Act, 21  U.S.C. section 360bbb-3(b)(1), unless the authorization is  terminated or revoked. Performed at [facility], [address] [place], [place] [address]      Labs: BNP (last 3 results) No results for input(s): BNP in the last 8760 hours. Basic Metabolic Panel: Recent Labs  Lab 03/29/20 1232 03/29/20 1653 03/30/20 0427 04/01/20 0828  NA 139  --   --  139  K 3.9  --   --  3.9  CL 108  --   --  108  CO2 24  --   --  23  GLUCOSE 120*  --   --  84  BUN 18  --   --  12  CREATININE 0.55  --   --  0.46  CALCIUM 8.3*  --   --  8.7*  MG  --  1.8 1.9 1.8  PHOS  --  2.7 3.2  --    Liver Function Tests: No results for input(s): AST, ALT, ALKPHOS, BILITOT, PROT, ALBUMIN in the last 168 hours. No results for input(s): LIPASE, AMYLASE in the last 168 hours. No results for input(s): AMMONIA in the last 168 hours. CBC: Recent Labs  Lab 04/01/20 0828  WBC 4.1  HGB 9.7*  HCT 30.0*  MCV 95.8  PLT 86*   Cardiac Enzymes: No results for input(s): CKTOTAL, CKMB, CKMBINDEX, TROPONINI in the last 168 hours. BNP: Invalid input(s): POCBNP CBG: Recent Labs  Lab 04/03/20 0727 04/03/20 1144 04/04/20 0759 04/04/20 1145 04/05/20 0752  GLUCAP 124* 150* 82 192* 86   D-Dimer No results for input(s): DDIMER in the last 72 hours. Hgb A1c No results for input(s): HGBA1C in the last 72 hours. Lipid Profile No results for input(s): CHOL, HDL, LDLCALC, TRIG, CHOLHDL, LDLDIRECT in the last 72 hours. Thyroid function studies No results for input(s): TSH, T4TOTAL, T3FREE, THYROIDAB in the last 72 hours.  Invalid input(s): FREET3 Anemia work up No results for input(s): VITAMINB12, FOLATE, FERRITIN, TIBC,  IRON, RETICCTPCT in the last 72 hours. Urinalysis    Component Value Date/Time   COLORURINE YELLOW (A) 03/28/2020 0334   APPEARANCEUR CLEAR (A) 03/28/2020 0334   LABSPEC 1.015 03/28/2020 0334   PHURINE 6.0 03/28/2020 0334   GLUCOSEU NEGATIVE 03/28/2020 0334   HGBUR NEGATIVE 03/28/2020 0334   BILIRUBINUR NEGATIVE 03/28/2020 0334   KETONESUR NEGATIVE 03/28/2020 0334   PROTEINUR NEGATIVE 03/28/2020 0334   NITRITE NEGATIVE 03/28/2020 0334   LEUKOCYTESUR NEGATIVE 03/28/2020 0334   Sepsis Labs Invalid input(s): PROCALCITONIN,  WBC,  LACTICIDVEN Microbiology Recent Results (from the past 240 hour(s))  Urine Culture     Status: None   Collection Time: 03/26/20 11:39 AM   Specimen: Urine, Catheterized  Result Value Ref Polinsky Status   Specimen Description   Final    URINE, CATHETERIZED Performed at [facility], [address], [place], [place] [address]    Special Requests   Final    NONE Performed at [facility], [address], [place], [place] [address]    Culture   Final    NO GROWTH Performed at [facility], [place] [address] [address] [place], [place] [address]    Report Status 03/27/2020 FINAL  Final  SARS CORONAVIRUS 2 (TAT 6-24 HRS) Nasopharyngeal Nasopharyngeal Swab     Status: None   Collection Time: 03/26/20 12:03 PM   Specimen: Nasopharyngeal Swab  Result Value Ref Kolar Status   SARS Coronavirus 2 NEGATIVE NEGATIVE Final    Comment: (NOTE) SARS-CoV-2 target nucleic acids are NOT DETECTED. The SARS-CoV-2 RNA is generally detectable in upper and lower respiratory specimens during the acute phase of infection. Negative results do not preclude SARS-CoV-2 infection, do not rule out co-infections with other pathogens, and should not be used as the sole basis for treatment or other patient management decisions. Negative results must be combined with clinical observations, patient history, and epidemiological information. The expected result is  Negative. Fact Sheet for Patients: SugarRoll.be Fact Sheet for Healthcare Providers: https://www.woods-mathews.com/ This test is not yet approved or cleared by the [place] FDA and  has been authorized for detection and/or diagnosis of SARS-CoV-2 by FDA under an  Emergency Use Authorization (EUA). This EUA will remain  in effect (meaning this test can be used) for the duration of the COVID-19 declaration under Section 56 4(b)(1) of the Act, 21 U.S.C. section 360bbb-3(b)(1), unless the authorization is terminated or revoked sooner. Performed at [facility], [place] [address] [place], [place] [address]   CULTURE, BLOOD (ROUTINE X 2) w Reflex to ID Panel     Status: None   Collection Time: 03/26/20  1:23 PM   Specimen: BLOOD  Result Value Ref Palazzo Status   Specimen Description BLOOD LEFT ANTECUBITAL  Final   Special Requests   Final    BOTTLES DRAWN AEROBIC AND ANAEROBIC Blood Culture adequate volume   Culture   Final    NO GROWTH 5 DAYS Performed at [facility], [address] [place], [place] [address]    Report Status 03/31/2020 FINAL  Final  CULTURE, BLOOD (ROUTINE X 2) w Reflex to ID Panel     Status: None   Collection Time: 03/26/20  1:23 PM   Specimen: BLOOD  Result Value Ref Centner Status   Specimen Description BLOOD BLOOD LEFT HAND  Final   Special Requests   Final    BOTTLES DRAWN AEROBIC AND ANAEROBIC Blood Culture adequate volume   Culture   Final    NO GROWTH 5 DAYS Performed at [name], [address] [place], [place] [address]    Report Status 03/31/2020 FINAL  Final  CULTURE, BLOOD (ROUTINE X 2) w Reflex to ID Panel     Status: None   Collection Time: 03/28/20  2:44 AM   Specimen: BLOOD  Result Value Ref Ludden Status   Specimen Description BLOOD LEFT HAND  Final   Special Requests   Final    BOTTLES DRAWN AEROBIC AND ANAEROBIC Blood Culture adequate volume   Culture   Final    NO  GROWTH 5 DAYS Performed at [facility], [address] [place], [place] [address]    Report Status 04/02/2020 FINAL  Final  CULTURE, BLOOD (ROUTINE X 2) w Reflex to ID Panel     Status: None   Collection Time: 03/28/20  2:56 AM   Specimen: BLOOD  Result Value Ref Wallick Status   Specimen Description BLOOD RIGHT AC  Final   Special Requests   Final    BOTTLES DRAWN AEROBIC AND ANAEROBIC Blood Culture results may not be optimal due to an inadequate volume of blood received in culture bottles   Culture   Final    NO GROWTH 5 DAYS Performed at [facility], [address] [place],  [address]    Report Status 04/02/2020 FINAL  Final  SARS CORONAVIRUS 2 (TAT 6-24 HRS) Nasopharyngeal Nasopharyngeal Swab     Status: None   Collection Time: 03/29/20  3:47 PM   Specimen: Nasopharyngeal Swab  Result Value Ref Shiveley Status   SARS Coronavirus 2 NEGATIVE NEGATIVE Final    Comment: (NOTE) SARS-CoV-2 target nucleic acids are NOT DETECTED. The SARS-CoV-2 RNA is generally detectable in upper and lower respiratory specimens during the acute phase of infection. Negative results do not preclude SARS-CoV-2 infection, do not rule out co-infections with other pathogens, and should not be used as the sole basis for treatment or other patient management decisions. Negative results must be combined with clinical observations, patient history, and epidemiological information. The expected result is Negative. Fact Sheet for Patients: SugarRoll.be Fact Sheet for Healthcare Providers: https://www.woods-mathews.com/ This test is not yet approved  or cleared by the [place] and  has been authorized for detection and/or diagnosis of SARS-CoV-2 by FDA under an Emergency Use Authorization (EUA). This EUA will remain  in effect (meaning this test can be used) for the duration of the COVID-19 declaration under Section 56 4(b)(1) of the  Act, 21 U.S.C. section 360bbb-3(b)(1), unless the authorization is terminated or revoked sooner. Performed at [facility], [place] [address], [place], [place] [address]   Respiratory Panel by RT PCR (Flu A&B, Covid) - Nasopharyngeal Swab     Status: None   Collection Time: 04/02/20 12:20 PM   Specimen: Nasopharyngeal Swab  Result Value Ref Bolding Status   SARS Coronavirus 2 by RT PCR NEGATIVE NEGATIVE Final    Comment: (NOTE) SARS-CoV-2 target nucleic acids are NOT DETECTED. The SARS-CoV-2 RNA is generally detectable in upper respiratoy specimens during the acute phase of infection. The lowest concentration of SARS-CoV-2 viral copies this assay can detect is 131 copies/mL. A negative result does not preclude SARS-Cov-2 infection and should not be used as the sole basis for treatment or other patient management decisions. A negative result may occur with  improper specimen collection/handling, submission of specimen other than nasopharyngeal swab, presence of viral mutation(s) within the areas targeted by this assay, and inadequate number of viral copies (<131 copies/mL). A negative result must be combined with clinical observations, patient history, and epidemiological information. The expected result is Negative. Fact Sheet for Patients:  PinkCheek.be Fact Sheet for Healthcare Providers:  GravelBags.it This test is not yet ap proved or cleared by the [place] FDA and  has been authorized for detection and/or diagnosis of SARS-CoV-2 by FDA under an Emergency Use Authorization (EUA). This EUA will remain  in effect (meaning this test can be used) for the duration of the COVID-19 declaration under Section 564(b)(1) of the Act, 21 U.S.C. section 360bbb-3(b)(1), unless the authorization is terminated or revoked sooner.    Influenza A by PCR NEGATIVE NEGATIVE Final   Influenza B by PCR NEGATIVE NEGATIVE Final     Comment: (NOTE) The Xpert Xpress SARS-CoV-2/FLU/RSV assay is intended as an aid in  the diagnosis of influenza from Nasopharyngeal swab specimens and  should not be used as a sole basis for treatment. Nasal washings and  aspirates are unacceptable for Xpert Xpress SARS-CoV-2/FLU/RSV  testing. Fact Sheet for Patients: PinkCheek.be Fact Sheet for Healthcare Providers: GravelBags.it This test is not yet approved or cleared by the [place] FDA and  has been authorized for detection and/or diagnosis of SARS-CoV-2 by  FDA under an Emergency Use Authorization (EUA). This EUA will remain  in effect (meaning this test can be used) for the duration of the  Covid-19 declaration under Section 564(b)(1) of the Act, 21  U.S.C. section 360bbb-3(b)(1), unless the authorization is  terminated or revoked. Performed at [facility], [address] [place], [place] [address]      Time coordinating discharge: 35 minutes  SIGNED:   Lorella Nimrod, MD  [facility] 04/05/2020, 9:50 AM Pager   If 7PM-7AM, please contact night-coverage [email or web address] Password [email or web address]

## 2020-04-05 NOTE — Plan of Care (Signed)
Plan of care resolved, patient discharged to facility via vehicle.

## 2020-04-05 NOTE — Progress Notes (Addendum)
Civil engineer, contracting [facility]) Hospital Liaison RN note  Notified by Sheila Gist, RN with [facility] team of family request for [facility] services at time of discharge. Chart and patient information under review by [facility] physician. Hospice eligibility pending at this time.  Spoke with daughter Sheila Bowman to initiate education related to hospice philosophy, services and team approach to care. Sheila Bowman verbalized understanding of information given. Per discussion, plan is for discharge to facility by EMS.  No DME needs identified at this time.  Patient will need prescriptions for discharge comfort medications. Pt will need DNR to discharge with her to facility.  Please call with any hospice related questions or concerns.  Thank you for this referral.  Haynes Bast, BSN, RN [facility] Liaison [phone]   1130 am Addendum: Pt does not meet hospice eligibility requirements at this time. [facility] will follow patient with outpatient community palliative care. Notified Sheila Bowman with TOC and have left message to notify daughter Sheila Bowman. During conversation yesterday with daughter, this possibility was discussed and she understood and was appreciative of any [facility] support.  1210 pm Addendum: Sheila Bowman returned phone call and is aware of Palliative referral. She is very Adult nurse.  Please call with any questions or concerns.  Thank you, Sheila Bowman

## 2020-04-05 NOTE — TOC Transition Note (Signed)
Transition of Care [facility]) - CM/SW Discharge Note   Patient Details  Name: Sheila Bowman MRN: [id number] Date of Birth: Apr 08, 1951  Transition of Care [facility]) CM/SW Contact:  Trenton Founds, RN Phone Number: 04/05/2020, 1:33 PM   Clinical Narrative:   Patient to discharge to [name], at [facility]. Contacted facility and spoke with Cassandra who reports they are aware of and ready for patient to be brought over. Did receive word from [facility] with [facility] that currently patient is not candidate for Hospice services but that she will continue to be served by Palliative Care. MD and bedside nurse made aware that patient is ready for transport. Once bedside nurse ready transport was called.     Final next level of care: Assisted Living(Memory Care) Barriers to Discharge: Other (comment)(placement into [facility])   Patient Goals and CMS Choice Patient states their goals for this hospitalization and ongoing recovery are:: Placement into memory care      Discharge Placement                Patient to be transferred to facility by: [facility]   Patient and family notified of of transfer: 04/05/20  Discharge Plan and Services                                     Social Determinants of Health (SDOH) Interventions     Readmission Risk Interventions No flowsheet data found.

## 2020-04-05 NOTE — Plan of Care (Signed)
Continue with current plan of care. Regarding education and ability to manage health, Pt is unable to verbalize understanding of this secondary to altered mental status.

## 2020-04-05 NOTE — Plan of Care (Signed)
Pt removed her IV.  She is a probable d/c tomorrow back to [facility]. Per on call NP ok to leave out.

## 2020-05-01 ENCOUNTER — Emergency Department: Payer: Medicare Other

## 2020-05-01 ENCOUNTER — Other Ambulatory Visit: Payer: Self-pay

## 2020-05-01 ENCOUNTER — Encounter: Payer: Self-pay | Admitting: Emergency Medicine

## 2020-05-01 ENCOUNTER — Emergency Department
Admission: EM | Admit: 2020-05-01 | Discharge: 2020-05-02 | Disposition: A | Discharge status: Home / Self Care | Payer: Medicare Other | Attending: Emergency Medicine | Admitting: Emergency Medicine

## 2020-05-01 DIAGNOSIS — I10 Essential (primary) hypertension: Secondary | ICD-10-CM | POA: Insufficient documentation

## 2020-05-01 DIAGNOSIS — Z87891 Personal history of nicotine dependence: Secondary | ICD-10-CM | POA: Diagnosis not present

## 2020-05-01 DIAGNOSIS — K5730 Diverticulosis of large intestine without perforation or abscess without bleeding: Secondary | ICD-10-CM | POA: Insufficient documentation

## 2020-05-01 DIAGNOSIS — E119 Type 2 diabetes mellitus without complications: Secondary | ICD-10-CM | POA: Insufficient documentation

## 2020-05-01 DIAGNOSIS — E860 Dehydration: Secondary | ICD-10-CM | POA: Insufficient documentation

## 2020-05-01 DIAGNOSIS — Z66 Do not resuscitate: Secondary | ICD-10-CM | POA: Insufficient documentation

## 2020-05-01 DIAGNOSIS — R410 Disorientation, unspecified: Secondary | ICD-10-CM | POA: Diagnosis present

## 2020-05-01 DIAGNOSIS — Z79899 Other long term (current) drug therapy: Secondary | ICD-10-CM | POA: Insufficient documentation

## 2020-05-01 LAB — COMPREHENSIVE METABOLIC PANEL
ALT: 26 U/L (ref 0–44)
AST: 39 U/L (ref 15–41)
Albumin: 2.8 g/dL — ABNORMAL LOW (ref 3.5–5.0)
Alkaline Phosphatase: 78 U/L (ref 38–126)
Anion gap: 8 (ref 5–15)
BUN: 8 mg/dL (ref 8–23)
CO2: 23 mmol/L (ref 22–32)
Calcium: 8.7 mg/dL — ABNORMAL LOW (ref 8.9–10.3)
Chloride: 109 mmol/L (ref 98–111)
Creatinine, Ser: 0.56 mg/dL (ref 0.44–1.00)
GFR calc Af Amer: >60 mL/min (ref >60)
GFR calc non Af Amer: >60 mL/min (ref >60)
Glucose, Bld: 161 mg/dL — ABNORMAL HIGH (ref 70–99)
Potassium: 3.6 mmol/L (ref 3.5–5.1)
Sodium: 140 mmol/L (ref 135–145)
Total Bilirubin: 2.3 mg/dL — ABNORMAL HIGH (ref 0.3–1.2)
Total Protein: 5.9 g/dL — ABNORMAL LOW (ref 6.5–8.1)

## 2020-05-01 LAB — AMMONIA: Ammonia: 54 umol/L — ABNORMAL HIGH (ref 9–35)

## 2020-05-01 LAB — CBC WITH DIFFERENTIAL/PLATELET
Abs Immature Granulocytes: 0.01 10*3/uL (ref 0.00–0.07)
Basophils Absolute: 0 10*3/uL (ref 0.0–0.1)
Basophils Relative: 1 %
Eosinophils Absolute: 0.1 10*3/uL (ref 0.0–0.5)
Eosinophils Relative: 4 %
HCT: 27.5 % — ABNORMAL LOW (ref 36.0–46.0)
Hemoglobin: 8.7 g/dL — ABNORMAL LOW (ref 12.0–15.0)
Immature Granulocytes: 0 %
Lymphocytes Relative: 30 %
Lymphs Abs: 0.9 10*3/uL (ref 0.7–4.0)
MCH: 29.6 pg (ref 26.0–34.0)
MCHC: 31.6 g/dL (ref 30.0–36.0)
MCV: 93.5 fL (ref 80.0–100.0)
Monocytes Absolute: 0.6 10*3/uL (ref 0.1–1.0)
Monocytes Relative: 19 %
Neutro Abs: 1.4 10*3/uL — ABNORMAL LOW (ref 1.7–7.7)
Neutrophils Relative %: 46 %
Platelets: 59 10*3/uL — ABNORMAL LOW (ref 150–400)
RBC: 2.94 MIL/uL — ABNORMAL LOW (ref 3.87–5.11)
RDW: 15.9 % — ABNORMAL HIGH (ref 11.5–15.5)
WBC: 3.1 10*3/uL — ABNORMAL LOW (ref 4.0–10.5)
nRBC: 0 % (ref 0.0–0.2)

## 2020-05-01 LAB — URINALYSIS, COMPLETE (UACMP) WITH MICROSCOPIC
Bacteria, UA: NONE SEEN
Bilirubin Urine: NEGATIVE
Glucose, UA: NEGATIVE mg/dL
Ketones, ur: NEGATIVE mg/dL
Nitrite: NEGATIVE
Protein, ur: NEGATIVE mg/dL
Specific Gravity, Urine: 1.015 (ref 1.005–1.030)
pH: 7 (ref 5.0–8.0)

## 2020-05-01 LAB — LACTIC ACID, PLASMA: Lactic Acid, Venous: 1.6 mmol/L (ref 0.5–1.9)

## 2020-05-01 MED ORDER — THIAMINE HCL 100 MG PO TABS: 50.0000 mg | ORAL_TABLET | Freq: Every day | ORAL | 0 refills | Status: AC

## 2020-05-01 MED ORDER — SODIUM CHLORIDE 0.9 % IV BOLUS
1000.0000 mL | Freq: Once | INTRAVENOUS | Status: AC
  Administered 2020-05-01: 1000 mL via INTRAVENOUS

## 2020-05-01 MED ORDER — FOLIC ACID 1 MG PO TABS: 1.0000 mg | ORAL_TABLET | Freq: Every day | ORAL | 0 refills | Status: AC

## 2020-05-01 MED ORDER — IOHEXOL 300 MG/ML  SOLN
100.0000 mL | Freq: Once | INTRAMUSCULAR | Status: AC | PRN
  Administered 2020-05-01: 100 mL via INTRAVENOUS

## 2020-05-01 MED ORDER — THIAMINE HCL 100 MG/ML IJ SOLN
100.0000 mg | Freq: Once | INTRAMUSCULAR | Status: AC
  Administered 2020-05-02: 100 mg via INTRAVENOUS
  Filled 2020-05-01: qty 2

## 2020-05-01 MED ORDER — LACTULOSE 10 GM/15ML PO SOLN: 30.0000 g | Freq: Two times a day (BID) | ORAL | 0 refills | Status: AC

## 2020-05-01 MED ORDER — LACTULOSE 10 GM/15ML PO SOLN
30.0000 g | Freq: Once | ORAL | Status: AC
  Administered 2020-05-02: 30 g via ORAL
  Filled 2020-05-01 (×2): qty 60

## 2020-05-01 NOTE — ED Triage Notes (Signed)
Pt arrived via [facility] from [facility], reports pt has increased weakness. Pt states she is "just not feeling right".

## 2020-05-01 NOTE — ED Notes (Signed)
This RN called [name] and spoke with Juluis Pitch and informed her on pt discharge, states that she has no further questions.

## 2020-05-01 NOTE — Discharge Instructions (Signed)
Sheila Bowman labs looked good overall  Continue thiamine and folic acid supplementation, which we have refilled  Take the lactulose twice a day to prevent high ammonia  Encourage water intake  IT IS VERY IMPORTANT THAT Sheila Bowman SEES AN OB-GYN IN THE NEXT 1-2 WEEKS FOR HER VAGINAL BLEEDING

## 2020-05-01 NOTE — ED Provider Notes (Signed)
[facility] Emergency Department Provider Note  ____________________________________________   First MD Initiated Contact with Patient 05/01/20 1959     (approximate)  I have reviewed the triage vital signs and the nursing notes.   HISTORY  Chief Complaint Weakness    HPI Sheila Bowman is a 69 y.o. female  With extensiver PMHx including chronic alcoholism, Werncike-Korsakoff, cirrhosis, previously on Hospice, here with confusion. Pt reports to me that she is here because she just "feels off." She denies any specific complaints other than feeling tired. She reports she has had some poor appetite recently but denies any significant abdominal pain. Denies any focal weakness or numbness. She reports she does have loose BMs but is on lactulose. She reports poor appetite but admits this is chronic for her and she does not have any specific increased weakness, or pain with eating. No known sick contacts. She does not believe she has had a fever.        Past Medical History:  Diagnosis Date  . Diabetes mellitus without complication (HCC)   . Hypertension     Patient Active Problem List   Diagnosis Date Noted  . Hospice care   . FTT (failure to thrive) in adult   . Acute hepatic encephalopathy 03/26/2020  . Wernicke-Korsakoff syndrome (alcoholic) (HCC) 03/26/2020  . Normocytic anemia   . Alcohol abuse   . Cirrhosis (HCC)   . Goals of care, counseling/discussion   . DNR (do not resuscitate)   . Palliative care by specialist   . Failure to thrive in adult   . Acute metabolic encephalopathy 02/18/2020  . 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 Alcohol abuse with alcohol-induced disorder (HCC) 02/18/2020  . Hypokalemia 02/18/2020  . Type 2 diabetes mellitus without complication (HCC) 02/18/2020  . Essential hypertension 02/18/2020    History reviewed. No pertinent surgical history.  Prior to Admission medications     Medication Sig Start Date End Date Taking? Authorizing Provider  feeding supplement, ENSURE ENLIVE, (ENSURE ENLIVE) LIQD Take 237 mLs by mouth 2 (two) times daily between meals. 02/25/20   Enedina Finner, MD  folic acid (FOLVITE) 1 MG tablet Take 1 tablet (1 mg total) by mouth daily. 05/01/20 06/30/20  Shaune Pollack, MD  lactulose (CHRONULAC) 10 GM/15ML solution Take 45 mLs (30 g total) by mouth 2 (two) times daily. 05/01/20 05/31/20  Shaune Pollack, MD  Multiple Vitamin (MULTIVITAMIN WITH MINERALS) TABS tablet Take 1 tablet by mouth daily. 02/26/20   Enedina Finner, MD  olmesartan (BENICAR) 40 MG tablet Take 1 tablet (40 mg total) by mouth daily. 02/25/20 02/24/21  Enedina Finner, MD  thiamine 100 MG tablet Take 0.5 tablets (50 mg total) by mouth daily. 05/01/20 06/30/20  Shaune Pollack, MD    Allergies Patient has no known allergies.  Family History  Problem Relation Age of Onset  . Heart attack Father     Social History Social History   Tobacco Use  . Smoking status: Former Games developer  . Smokeless tobacco: Never Used  Substance Use Topics  . Alcohol use: Yes    Comment: daily  . Drug use: Never    Review of Systems  Review of Systems  Constitutional: Positive for fatigue. Negative for chills and fever.  HENT: Negative for sore throat.   Respiratory: Negative for shortness of breath.   Cardiovascular: Negative for chest pain.  Gastrointestinal: Negative for abdominal pain.  Genitourinary: Negative for flank pain.  Musculoskeletal: Negative for neck pain.  Skin:  Negative for rash and wound.  Allergic/Immunologic: Negative for immunocompromised state.  Neurological: Positive for weakness. Negative for numbness.  Hematological: Does not bruise/bleed easily.  Psychiatric/Behavioral: Positive for confusion.     ____________________________________________  PHYSICAL EXAM:      VITAL SIGNS: ED Triage Vitals  Enc Vitals Group     BP 05/01/20 1937 (!) 161/71     Pulse Rate 05/01/20 1937 79      Resp 05/01/20 1937 16     Temp 05/01/20 1937 98.7 F (37.1 C)     Temp Source 05/01/20 1937 Oral     SpO2 05/01/20 1937 99 %     Weight 05/01/20 1938 145 lb (65.8 kg)     Height 05/01/20 1938 5\' 5"  (1.651 m)     Head Circumference --      Peak Flow --      Pain Score 05/01/20 1938 0     Pain Loc --      Pain Edu? --      Excl. in GC? --      Physical Exam Vitals and nursing note reviewed.  Constitutional:      General: She is not in acute distress.    Appearance: She is well-developed.  HENT:     Head: Normocephalic and atraumatic.  Eyes:     Conjunctiva/sclera: Conjunctivae normal.  Cardiovascular:     Rate and Rhythm: Normal rate and regular rhythm.     Heart sounds: Normal heart sounds.  Pulmonary:     Effort: Pulmonary effort is normal. No respiratory distress.     Breath sounds: No wheezing.  Abdominal:     General: There is no distension.  Musculoskeletal:     Cervical back: Neck supple.  Skin:    General: Skin is warm.     Capillary Refill: Capillary refill takes less than 2 seconds.     Findings: No rash.  Neurological:     Mental Status: She is alert.     Motor: No abnormal muscle tone.     Comments: Patient is able to tell me where she is, the year, and why she is here. Knows she is at 07/01/20. Follows commands. She is intermittently confused, however, with occasional confabulation. No asterixis is noted.       ____________________________________________   LABS (all labs ordered are listed, but only abnormal results are displayed)  Labs Reviewed  URINALYSIS, COMPLETE (UACMP) WITH MICROSCOPIC - Abnormal; Notable for the following components:      Result Value   Hgb urine dipstick MODERATE (*)    Leukocytes,Ua TRACE (*)    All other components within normal limits  AMMONIA - Abnormal; Notable for the following components:   Ammonia 54 (*)    All other components within normal limits  CBC WITH DIFFERENTIAL/PLATELET - Abnormal; Notable for the  following components:   WBC 3.1 (*)    RBC 2.94 (*)    Hemoglobin 8.7 (*)    HCT 27.5 (*)    RDW 15.9 (*)    Platelets 59 (*)    Neutro Abs 1.4 (*)    All other components within normal limits  COMPREHENSIVE METABOLIC PANEL - Abnormal; Notable for the following components:   Glucose, Bld 161 (*)    Calcium 8.7 (*)    Total Protein 5.9 (*)    Albumin 2.8 (*)    Total Bilirubin 2.3 (*)    All other components within normal limits  LACTIC ACID, PLASMA    ____________________________________________  EKG:  Normal sinu srhythm, VR 73. QRS 98, QTc 471. Slight right axis deviation. No ST elevations or depressions. ________________________________________  RADIOLOGY All imaging, including plain films, CT scans, and ultrasounds, independently reviewed by me, and interpretations confirmed via formal radiology reads.  ED MD interpretation:   CT head: NAICA CT A/P: Cirrhosis, diffuse bowel edema likely related to this, thickened endometrium noted  Official radiology report(s): CT Head Wo Contrast  Result Date: 05/01/2020 CLINICAL DATA:  Encephalopathy. Increased weakness. EXAM: CT HEAD WITHOUT CONTRAST TECHNIQUE: Contiguous axial images were obtained from the base of the skull through the vertex without intravenous contrast. COMPARISON:  Head CT 03/19/2020 FINDINGS: Brain: No intracranial hemorrhage, mass effect, or midline shift. Stable generalized atrophy and chronic small vessel ischemia. No hydrocephalus. The basilar cisterns are patent. Stable basal gangliar mineralization. No evidence of territorial infarct or acute ischemia. No extra-axial or intracranial fluid collection. Vascular: Atherosclerosis of skullbase vasculature without hyperdense vessel or abnormal calcification. Skull: No fracture or focal lesion. Sinuses/Orbits: Paranasal sinuses and mastoid air cells are clear. The visualized orbits are unremarkable. Other: None. IMPRESSION: 1. No acute intracranial abnormality. 2. Stable  atrophy and chronic small vessel ischemia. Electronically Signed   By: Keith Rake M.D.   On: 05/01/2020 23:15   CT ABDOMEN PELVIS W CONTRAST  Result Date: 05/01/2020 CLINICAL DATA:  69 year old female with nausea vomiting. EXAM: CT ABDOMEN AND PELVIS WITH CONTRAST TECHNIQUE: Multidetector CT imaging of the abdomen and pelvis was performed using the standard protocol following bolus administration of intravenous contrast. CONTRAST:  138mL OMNIPAQUE IOHEXOL 300 MG/ML  SOLN COMPARISON:  CT abdomen pelvis dated 03/20/2020. FINDINGS: Lower chest: Bibasilar linear atelectasis/scarring. The visualized lung bases are otherwise clear. There is calcification of the mitral annulus. No intra-abdominal free air. Trace perihepatic ascites. Hepatobiliary: Cirrhosis. There is mild intrahepatic biliary ductal dilatation versus mild periportal edema. No calcified gallstone. Trace pericholecystic fluid may be related to cirrhosis. Ultrasound may provide better evaluation if there is clinical concern for acute cholecystitis. Pancreas: Unremarkable. No pancreatic ductal dilatation or surrounding inflammatory changes. Spleen: Normal in size without focal abnormality. Adrenals/Urinary Tract: The adrenal glands are unremarkable. There is no hydronephrosis on either side. A 3.5 cm left renal upper pole cyst similar to prior CT. There is symmetric enhancement and excretion of contrast by both kidneys. The visualized ureters and urinary bladder appear unremarkable. Stomach/Bowel: There is sigmoid diverticulosis with muscular hypertrophy. Diffuse colonic as well as sigmoid thickening may be partly related to hepatic colopathy. Colitis is less likely but not excluded. Clinical correlation is recommended. There is a small hiatal hernia. There is no bowel obstruction. Mildly thickened and edematous appearing small bowel loops in the mid abdomen likely related to hepatic enteropathy. The appendix is normal. Vascular/Lymphatic: Moderate  aortoiliac atherosclerotic disease. The IVC is unremarkable. Dilated venous collaterals as well as recanalization of the umbilical vein. No adenopathy. Reproductive: The uterus is retroverted or retroflexed. The endometrium appears thickened. Further evaluation with pelvic ultrasound on a nonemergent/outpatient basis recommended. The endometrium measures approximately 9 mm. Other: None Musculoskeletal: Degenerative changes of the spine. No acute osseous pathology. IMPRESSION: 1. Cirrhosis with evidence of portal hypertension and trace perihepatic ascites. 2. Sigmoid diverticulosis. Diffuse thickened appearance of the colon as well as mildly edematous loops of small bowel in the mid abdomen, likely related to liver disease. Mild enterocolitis is less likely. No bowel obstruction. Normal appendix. 3. Thickened endometrium. Further evaluation with pelvic ultrasound on a nonemergent/outpatient basis recommended. 4. Aortic Atherosclerosis (ICD10-I70.0). Electronically Signed  By: Elgie Collard M.D.   On: 05/01/2020 23:18    ____________________________________________  PROCEDURES   Procedure(s) performed (including Critical Care):  Procedures  ____________________________________________  INITIAL IMPRESSION / MDM / ASSESSMENT AND PLAN / ED COURSE  As part of my medical decision making, I reviewed the following data within the electronic MEDICAL RECORD NUMBER Nursing notes reviewed and incorporated, Old chart reviewed, Notes from prior ED visits, and [place] Controlled Substance Database       *Marg Valladares was evaluated in Emergency Department on 05/02/2020 for the symptoms described in the history of present illness. She was evaluated in the context of the global COVID-19 pandemic, which necessitated consideration that the patient might be at risk for infection with the SARS-CoV-2 virus that causes COVID-19. Institutional protocols and algorithms that pertain to the evaluation of patients at risk for  COVID-19 are in a state of rapid change based on information released by regulatory bodies including the CDC and federal and state organizations. These policies and algorithms were followed during the patient's care in the ED.  Some ED evaluations and interventions may be delayed as a result of limited staffing during the pandemic.*     Medical Decision Making:  69 yo F here with reported confusion and "feeling off." Clinically, she appears very well. She has an extensive alcohol history with previously noted Wernicke-Korsakoff, and per review of records seems to be at her mental baseline. Broad screening labs and imaging obtained. CT head is negative and she has no focal neurological deficits. No seizure like activity. Her ammonia is minimally elevated but she has no asterixis, and has been given lactulose here. She reports loose stools but has no abd TTP and I suspect her sx are more related to her bowel edema from cirrhosis. Otherwise, no signs of UTI or occult infection. She feels better in ED with gentle fluids.   Given reassuring labs and imaging, no apparent emergent condition identified. Will refill her thiamine/folic acid as this could be contributing and encourage fluids at her facility.  ____________________________________________  FINAL CLINICAL IMPRESSION(S) / ED DIAGNOSES  Final diagnoses:  Dehydration     MEDICATIONS GIVEN DURING THIS VISIT:  Medications  sodium chloride 0.9 % bolus 1,000 mL (0 mLs Intravenous Stopped 05/01/20 2325)  iohexol (OMNIPAQUE) 300 MG/ML solution 100 mL (100 mLs Intravenous Contrast Given 05/01/20 2236)  lactulose (CHRONULAC) 10 GM/15ML solution 30 g (30 g Oral Given 05/02/20 0010)  thiamine (B-1) injection 100 mg (100 mg Intravenous Given 05/02/20 0014)     ED Discharge Orders         Ordered    thiamine 100 MG tablet  Daily     05/01/20 2343    folic acid (FOLVITE) 1 MG tablet  Daily     05/01/20 2343    lactulose (CHRONULAC) 10 GM/15ML solution  2  times daily     05/01/20 2343           Note:  This document was prepared using Dragon voice recognition software and may include unintentional dictation errors.   Shaune Pollack, MD 05/02/20 [phone]

## 2020-05-02 DIAGNOSIS — E860 Dehydration: Secondary | ICD-10-CM | POA: Diagnosis not present

## 2020-05-06 ENCOUNTER — Other Ambulatory Visit: Payer: Self-pay

## 2020-05-06 ENCOUNTER — Non-Acute Institutional Stay: Payer: Medicare Other | Admitting: Nurse Practitioner

## 2020-05-06 ENCOUNTER — Encounter: Payer: Self-pay | Admitting: Nurse Practitioner

## 2020-05-06 DIAGNOSIS — Z515 Encounter for palliative care: Secondary | ICD-10-CM

## 2020-05-06 DIAGNOSIS — K7030 Alcoholic cirrhosis of liver without ascites: Secondary | ICD-10-CM

## 2020-05-06 NOTE — Progress Notes (Signed)
[facility] Consult Note Telephone: [phone]  Fax: [phone]  PATIENT NAME: Sheila Bowman DOB: [birth year] MRN: [id number]  PRIMARY CARE PROVIDER:   Jovita Kussmaul, MD  REFERRING PROVIDER:  [facility] RESPONSIBLE PARTY: Lestine Box, daughter [phone]  RECOMMENDATIONS and PLAN:  1. ACP: DNR placed Vynca  2. Palliative care encounter; Palliative medicine team will continue to support patient, patient's family, and medical team. Visit consisted of counseling and education dealing with the complex and emotionally intense issues of symptom management and palliative care in the setting of serious and potentially life-threatening illness  I spent 90 minutes providing this consultation,  from 10:30am to 12:00pm. More than 50% of the time in this consultation was spent coordinating communication.   HISTORY OF PRESENT ILLNESS:  Sheila Bowman is a 69 y.o. year old female with multiple medical problems including Wernicke's Korsakoff syndrome, Alcoholic cirrhosis, htn, diabetes, alcoholic abuse, failure to thrive. Hospitalize 2 / 18 / 2021 to 2 / 25 / 2021 for acute metabolic encephalopathy due to daily alcohol abuse with long-standing hallucinations, confusion. CT brain, MRI, EG were all no acute abnormalities. High-dose thiamine given, Psychiatry did not feel need inpatient behavioral. Alcoholic cirrhosis without ascites with hyperammonemia, thrombocytopenia with ammonia level 48 an admission trended 225. On methamphetamine as an outpatient daughter concern for the patient might be over taking this medication so it was discontinued. DNR in place. Hospitalized 3 / 20 / 2021 to 4 / 6 / 2021 for acute metabolic encephalopathy, suspected Wernicke's korsakoff syndrome. Ammonia level 144. Palliative care consulted her documentation daughter Sheila Bowman has been having trouble at home with Sheila Bowman. Frequent episodes of  agitation, hallucinations, combativeness urinates all over the house. Interested in hospice care what she was admitted. Medical goals are no feeding tube and G-tube help, no pegs with you. Family except through the desperation. Discharged to memory care [facility] at [facility]. She did have a visit to the emergency department 5 / 2 / 2021 to 5 / 3 / 2021 for weakness, poor appetite loose BM's, on lactulose. He was rehydrated in the emergency department and refilled Sheila Bowman with folic acid return to [facility]ila [facility]man. Sheila. Bowman currently resides at [facility]. Staff endorses she has been ambulatory, assist with ADLs. No falls since hospitalization. Sheila Bowman does feed herself an appetite has been fairly good depending on what is being served. Staff endorses she is able to answer questions and verbalized her needs. At present Sheila Bowman is sitting in her room. Sheila. Bowman appears comfortable. Visitors present. I visited and observe Sheila Bowman. We talked about has she was feeling. Sheila. Bowman endorses she is doing okay. We talked about symptoms of pain and shortness of breath when she denies. We talked about her appetite. Sheila Bowman endorses that she does eat some foods but not others. Not all the food is good. We talked about past medical history and she was very vague. We talked about the reason behind her being in the hospital then [facility]. Sheila. Bowman endorses she is not sure how she got there nor why. Sheila. Bowman was very cooperative during assessment, smiled a lot and laughed. Sheila. Bowman talked about family dynamics. Sheila. Bowman talked about her grandchildren and son. Sheila. Bowman talked about her daughter. Limited verbal discussion concerning medical goals of care. Emotional support provided. Sheila Bowman does look stable at present time. DNR in place. Attempted to contact daughter for palliative  care visit update. No new changes recommended at this time, updated staff. Will continue to follow monitor next visit in 4  weeks or sooner should decline  Palliative Care was asked to help to continue to address goals of care.   04/09/2020 weight 127.9 lbs 05/01/2020 Weight 145 lbs  CODE STATUS: DNR  PPS: 60% HOSPICE ELIGIBILITY/DIAGNOSIS: TBD  PAST MEDICAL HISTORY:  Past Medical History:  Diagnosis Date  . Diabetes mellitus without complication (HCC)   . Hypertension     SOCIAL HX:  Social History   Tobacco Use  . Smoking status: Former Games developer  . Smokeless tobacco: Never Used  Substance Use Topics  . Alcohol use: Yes    Comment: daily    ALLERGIES: No Known Allergies   PERTINENT MEDICATIONS:  Outpatient Encounter Medications as of 05/06/2020  Medication Sig  . feeding supplement, ENSURE ENLIVE, (ENSURE ENLIVE) LIQD Take 237 mLs by mouth 2 (two) times daily between meals.  . folic acid (FOLVITE) 1 MG tablet Take 1 tablet (1 mg total) by mouth daily.  Marland Kitchen lactulose (CHRONULAC) 10 GM/15ML solution Take 45 mLs (30 g total) by mouth 2 (two) times daily.  . Multiple Vitamin (MULTIVITAMIN WITH MINERALS) TABS tablet Take 1 tablet by mouth daily.  Marland Kitchen olmesartan (BENICAR) 40 MG tablet Take 1 tablet (40 mg total) by mouth daily.  Marland Kitchen thiamine 100 MG tablet Take 0.5 tablets (50 mg total) by mouth daily.   No facility-administered encounter medications on file as of 05/06/2020.    PHYSICAL EXAM:   General: pleasant female Cardiovascular: regular rate and rhythm Pulmonary: clear ant fields Neurological: ambulatory  Sheila Roediger Prince [place], NP

## 2020-05-11 ENCOUNTER — Telehealth: Payer: Self-pay | Admitting: Nurse Practitioner

## 2020-05-11 NOTE — Telephone Encounter (Signed)
I called Sheila Endo, Ms Can daughter, Health Care power of attorney for update on palliative care visit. We talked about past medical history, current disease progression. We talked about recent hospitalization with challenges discharging to locked memory care unit. We talked about her functional ability. We talked about Ms Howland is cognitive impairment and the impact that it has had on her. We talked about medical goals with care. We talked about coping strategies. We talked about symptoms, appetite which has been improving. We talked about realistic expectations with progression. We talked about psychiatry and would be beneficial. Sheila Bowman endorses she will request consult. We talked about role of palliative care and plan of care. Will continue to follow up with how to pair with next visit in 4 weeks if needed or sooner since she declined. Kelly in agreement. Therapeutic listening and emotional support provided. Contact information provided. Questions answered to satisfaction.  Total time spent 20 minutes Phone discussion 15 minutes  Documentation 5 minutes

## 2020-05-18 ENCOUNTER — Telehealth: Payer: Self-pay | Admitting: Nurse Practitioner

## 2020-05-18 NOTE — Telephone Encounter (Signed)
Sheila Bowman called from locked memory care unit to ask about discharged home. Explain to Sheila Bowman that she needed to further discuss with her physicians at the facility and staff. Encourage Sheila Bowman to go speak with her caregivers. Explained to Sheila Bowman that under no circumstances did palliative care suggest that she could go home as she currently is in a safe place. Therapeutic listening and emotional support provided. I attempted to contact Sheila Bowman for update on phone call will fax note.  Total time spent 15 minutes  Documentation 5 minutes  Phone discussion 10 minutes

## 2020-07-10 IMAGING — CT CT HEAD W/O CM
4 of 6 series · 16 of 47 positions shown, 18 images · non-contrast
Comparison: CT head 02/18/2020

CLINICAL DATA: Altered mental status, confusion, recent ETOH detox

EXAM:
CT HEAD WITHOUT CONTRAST
TECHNIQUE: Contiguous axial images were obtained from the base of the skull
through the vertex without intravenous contrast.

[Series 3: head wo · axial · 0.43mm/px · z∈[-164,-49]mm · 8 of 31 slices shown, 10 images (1 of 2)]
[im 4/31  brain]
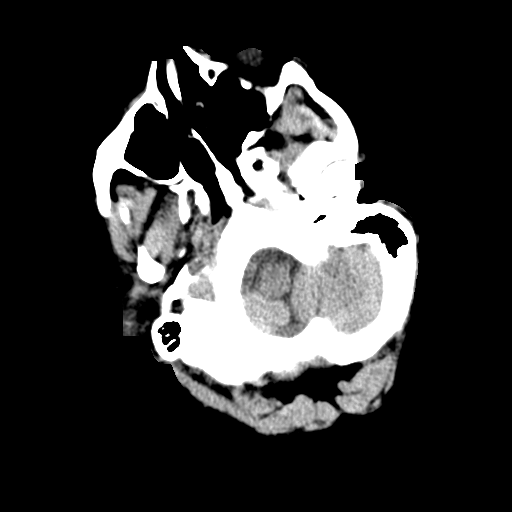
[im 4/31  bone]
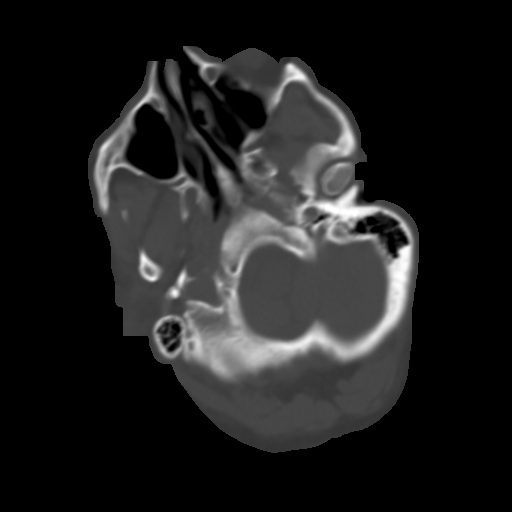
[im 7/31  brain]
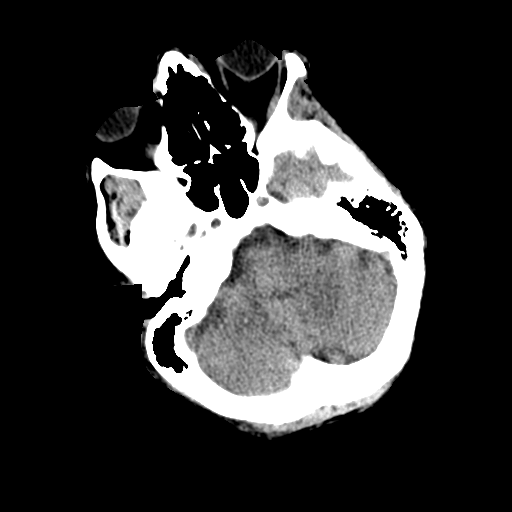
[im 11/31  brain]
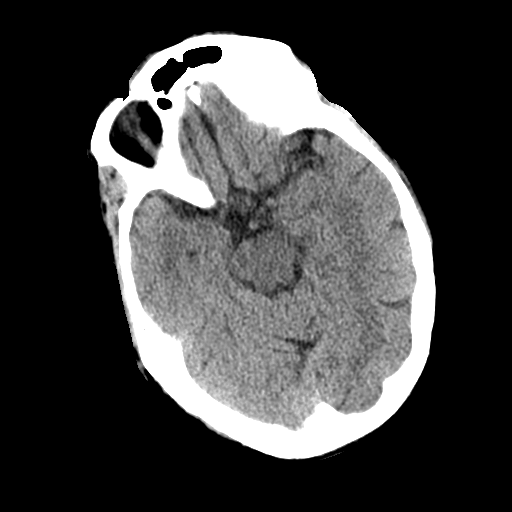
[im 14/31  brain]
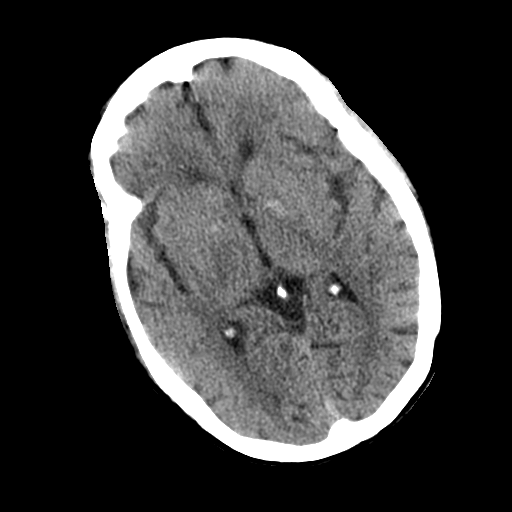
[im 17/31  brain]
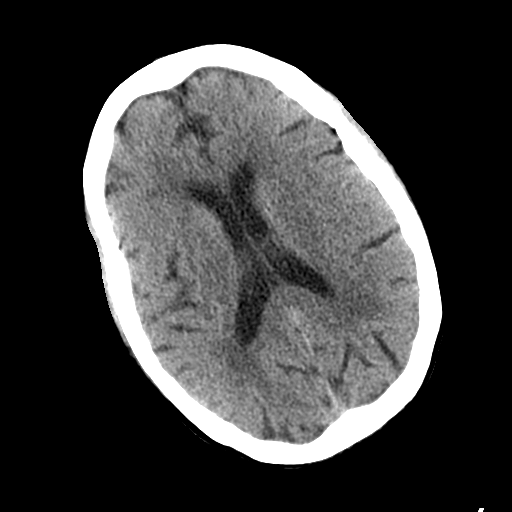
[im 17/31  bone]
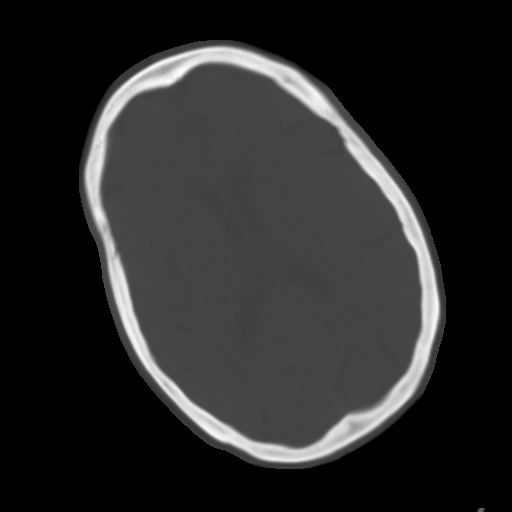
[im 21/31  brain]
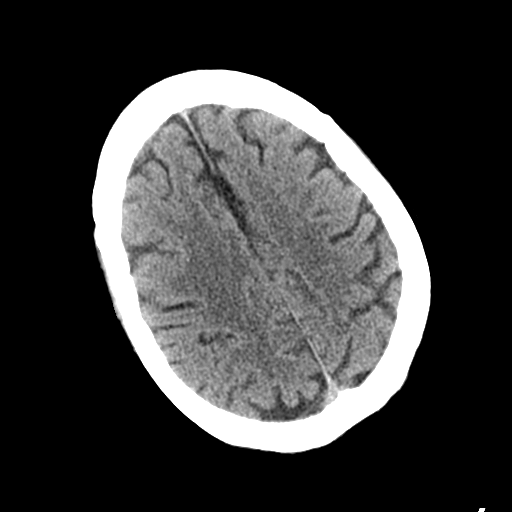
[im 24/31  brain]
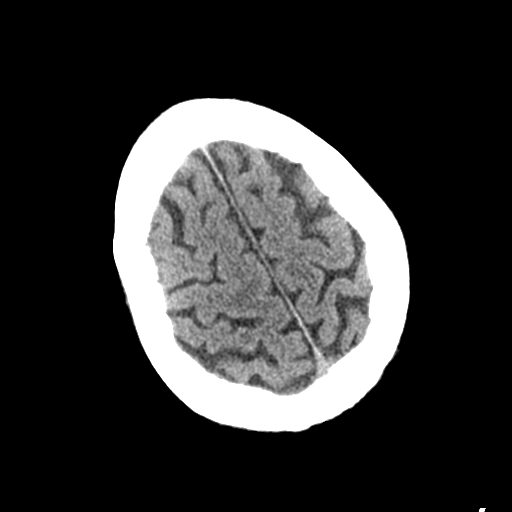
[im 27/31  brain]
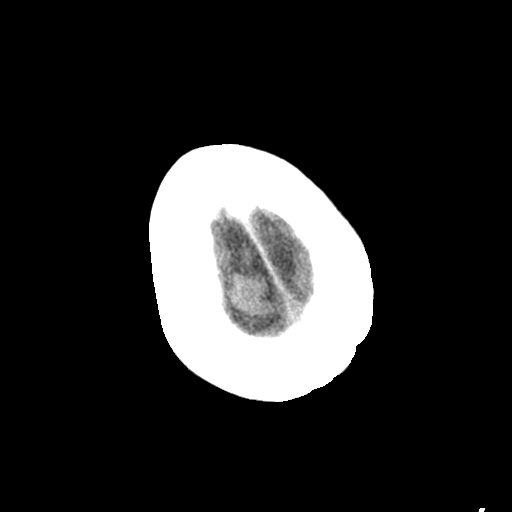

[Series 7: head wo · axial · 0.43mm/px · z∈[-164,-149]mm · 2 of 30 slices shown (2 of 2)]
[im 4/30  brain]
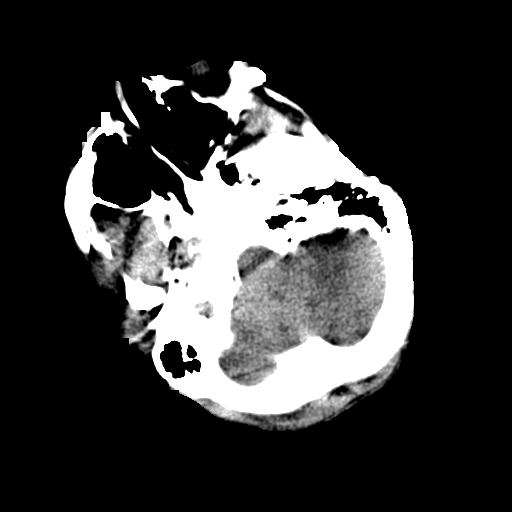
[im 7/30  brain]
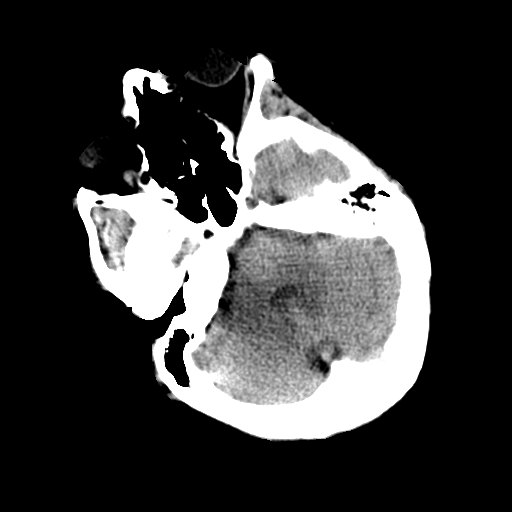

[Series 11: coronal soft tissue · coronal · 0.29mm/px · 3 of 66 slices shown]
[im 24/66  brain]
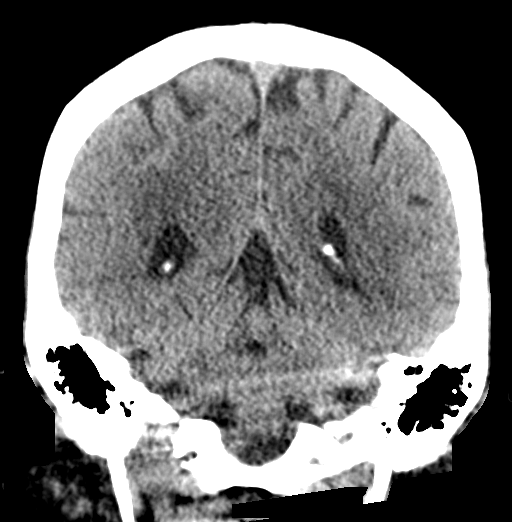
[im 30/66  brain]
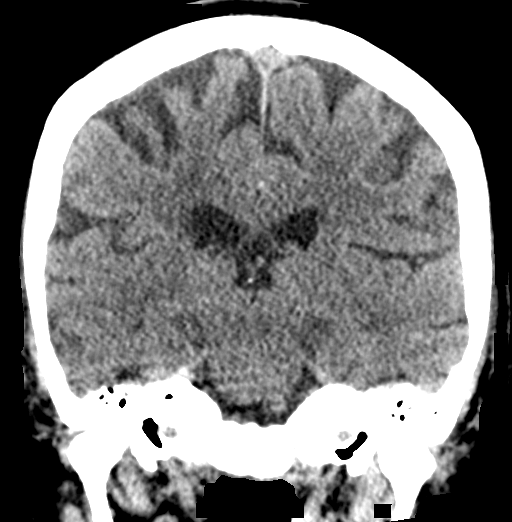
[im 36/66  brain]
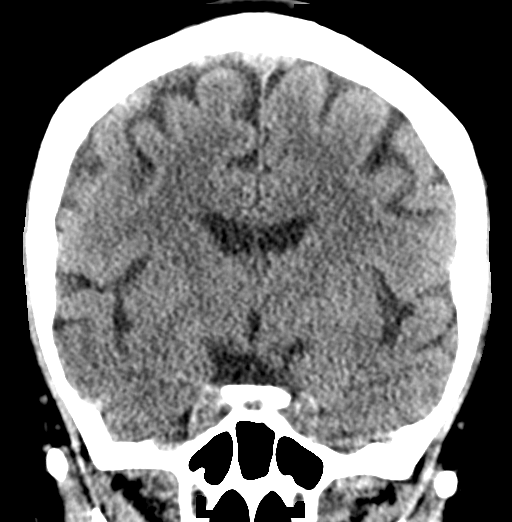

[Series 12: sagittal soft tissue · sagittal · 0.30mm/px · 3 of 50 slices shown]
[im 12/50  brain]
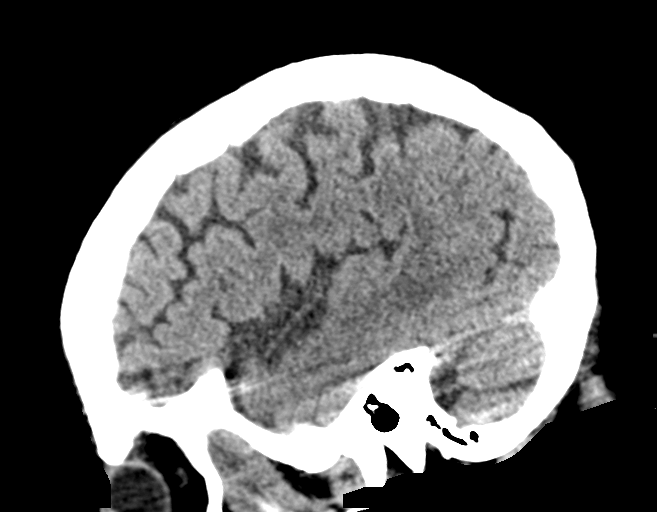
[im 20/50  brain]
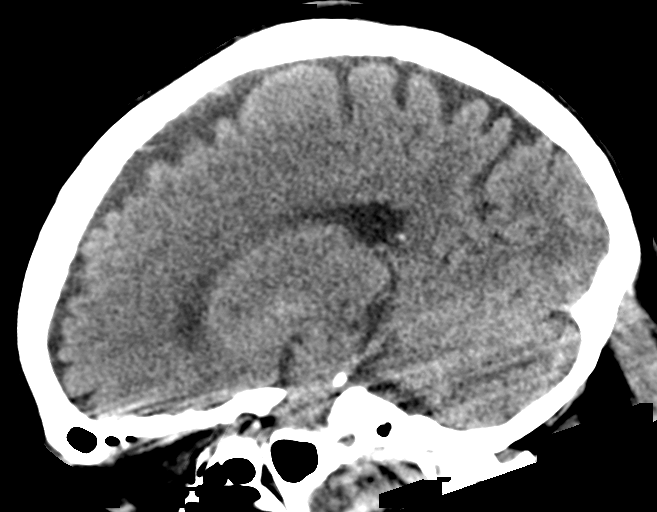
[im 28/50  brain]
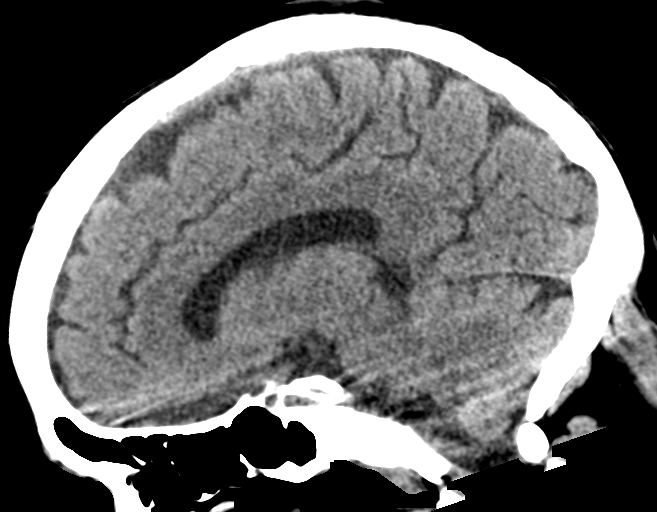

[16 of 47 positions shown; findings below may reference images not displayed]

FINDINGS: Brain: No evidence of acute infarction, hemorrhage, hydrocephalus,
extra-axial collection or mass lesion/mass effect. Symmetric
prominence of the ventricles, cisterns and sulci compatible with
parenchymal volume loss. Patchy areas of white matter
hypoattenuation are most compatible with chronic microvascular
angiopathy. Mild hyperattenuation of the globus pallidi may reflect
senescent mineralization similar to prior exams.

Vascular: Atherosclerotic calcification of the carotid siphons. No
hyperdense vessel.

Skull: Motion at the level of the skull base. No visible calvarial
fracture or suspicious osseous lesion. No scalp swelling or
hematoma.

Sinuses/Orbits: Paranasal sinuses and mastoid air cells are
predominantly clear. Included orbital structures are unremarkable.

Other: Likely chronic deformity of the nasal bones.
IMPRESSION: No acute intracranial abnormality.

Mild chronic microvascular angiopathy and parenchymal volume loss
similar to comparison.

Motion degradation may limit evaluation of the skull base and
posterior fossa.

## 2020-07-15 ENCOUNTER — Other Ambulatory Visit: Payer: Self-pay

## 2020-07-15 ENCOUNTER — Encounter: Payer: Self-pay | Admitting: Nurse Practitioner

## 2020-07-15 ENCOUNTER — Non-Acute Institutional Stay: Payer: Medicare Other | Admitting: Nurse Practitioner

## 2020-07-15 DIAGNOSIS — K7030 Alcoholic cirrhosis of liver without ascites: Secondary | ICD-10-CM

## 2020-07-15 DIAGNOSIS — Z515 Encounter for palliative care: Secondary | ICD-10-CM

## 2020-07-15 IMAGING — CR DG CHEST 2V
2 series · 2 of 2 positions shown · non-contrast
Comparison: 03/19/2020
COMPARISON: 03/19/2020

Addendum:
CLINICAL DATA: Smoking history, hypertension

EXAM:
CHEST - 2 VIEW

[chest pa]
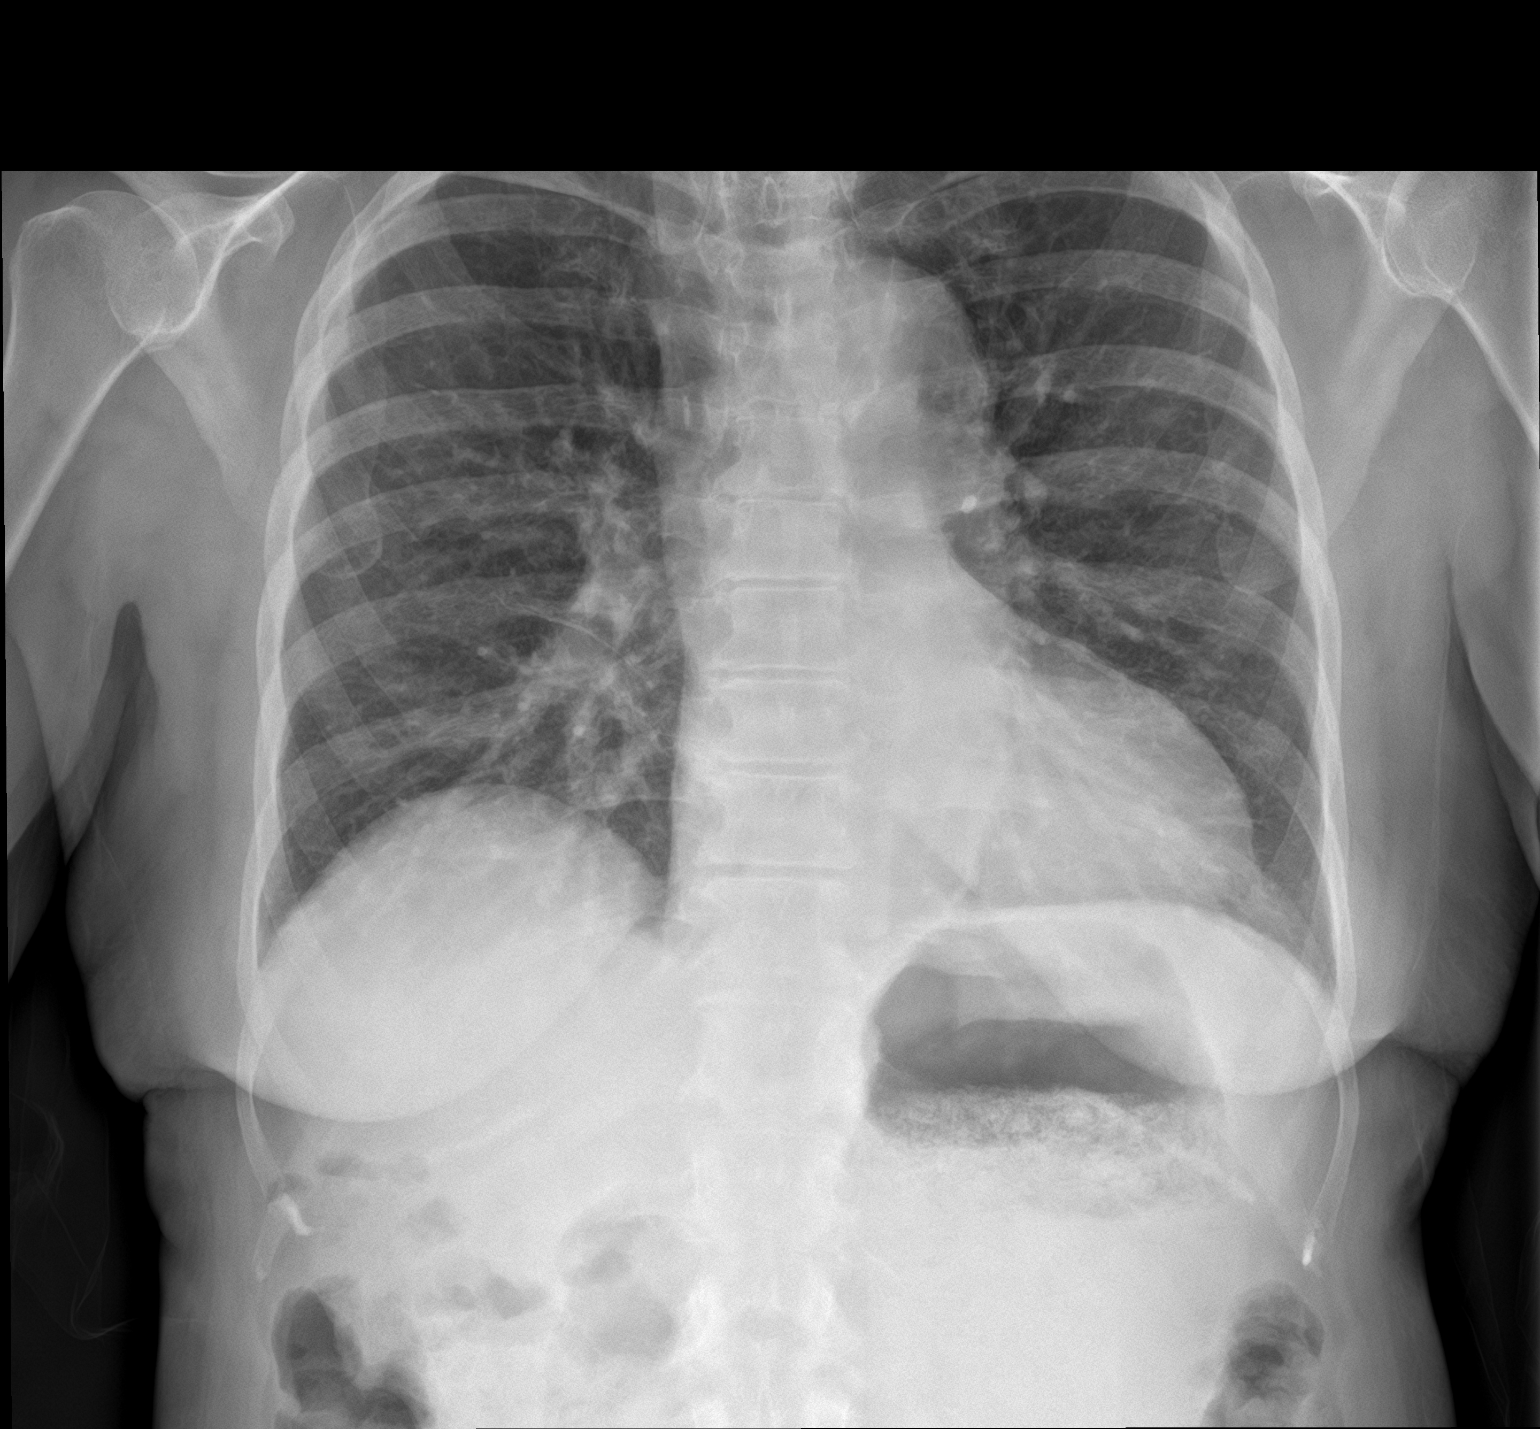

[chest lat]
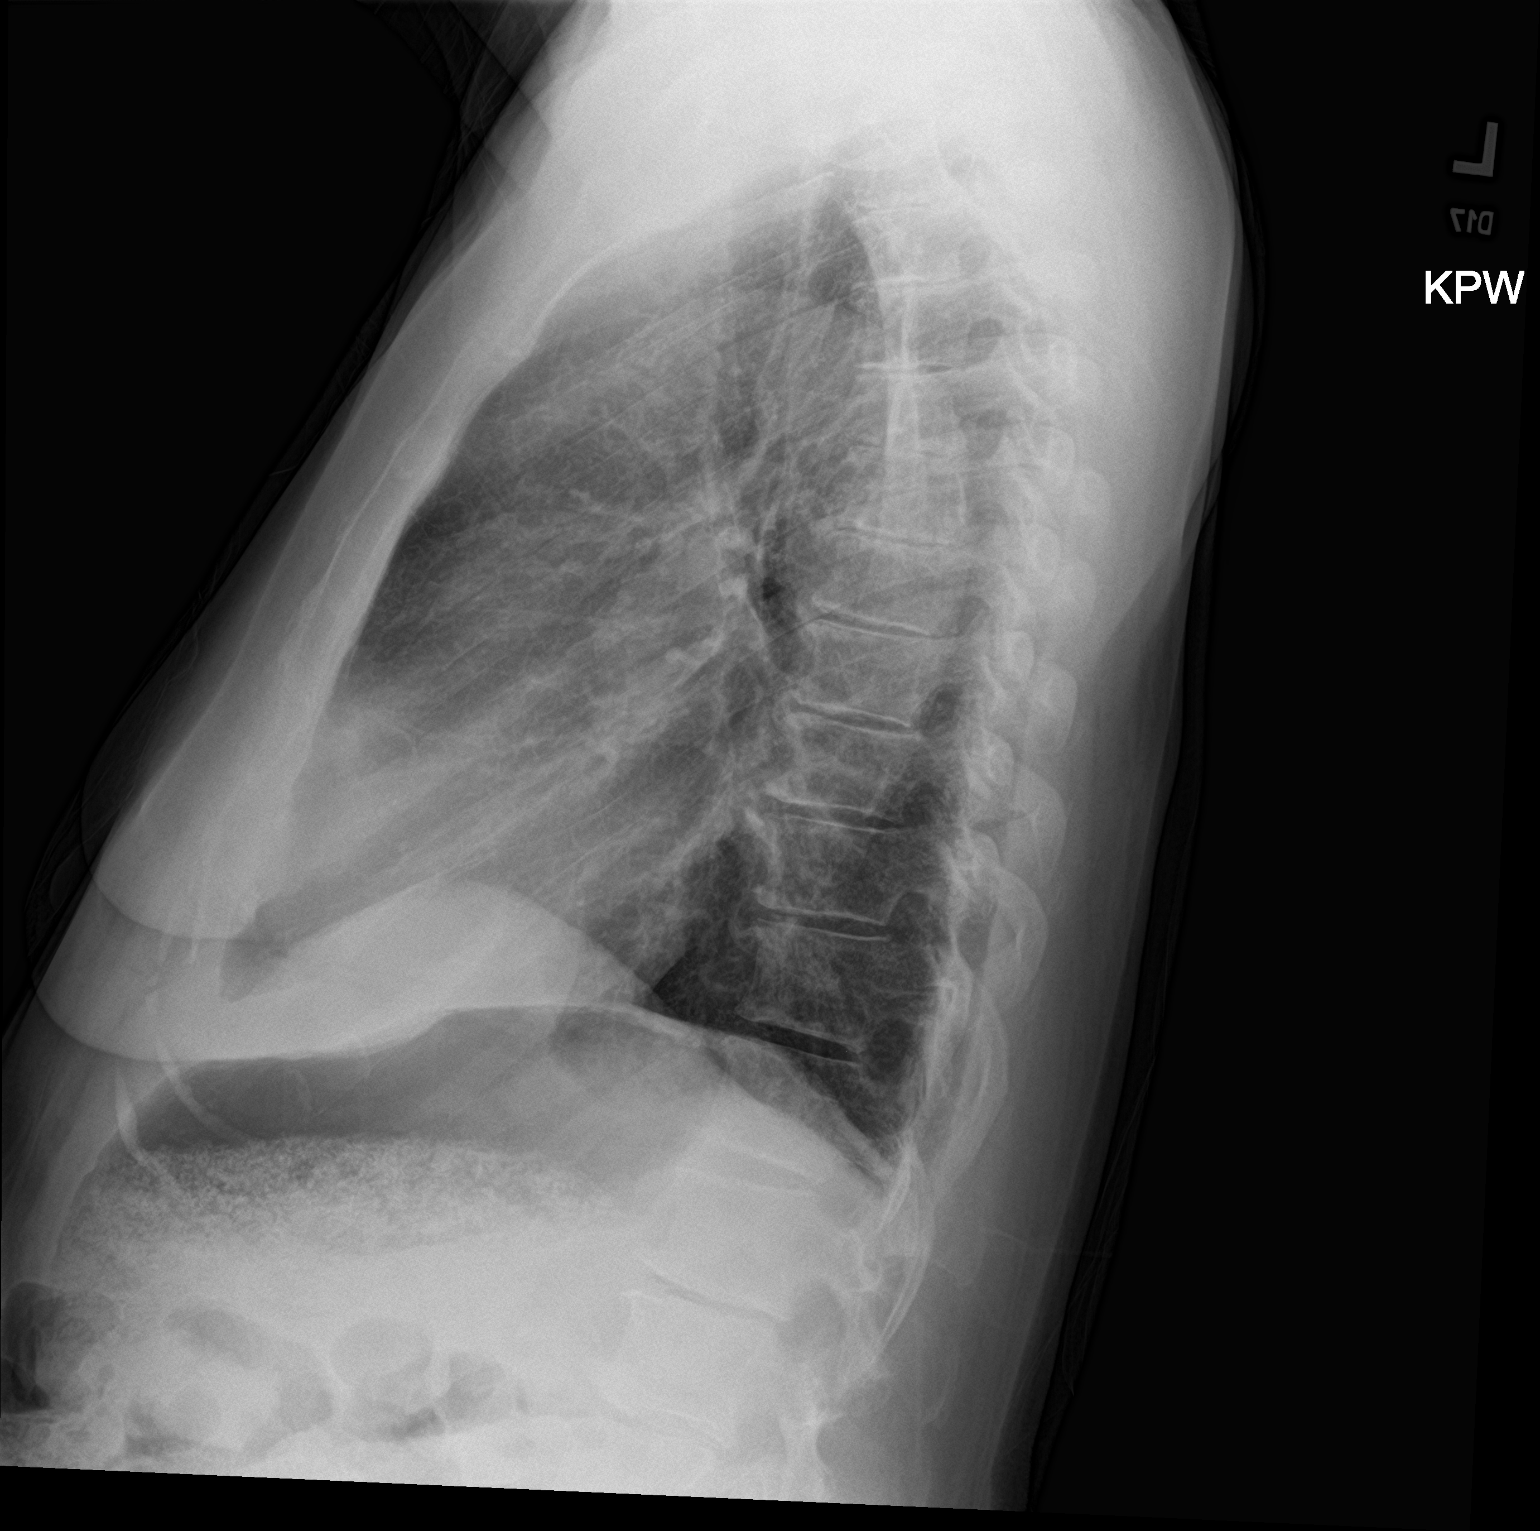

[2 of 2 positions shown; findings below may reference images not displayed]

FINDINGS: The heart size and mediastinal contours are stable. Atherosclerotic
calcification of the aortic knob. No focal airspace consolidation,
pleural effusion, or pneumothorax. The visualized skeletal
structures are unremarkable.
IMPRESSION: No active cardiopulmonary disease.

ADDENDUM:
No evidence to suggest active TB infection.

No changes to the Findings section of the report.

*** End of Addendum ***
FINDINGS: The heart size and mediastinal contours are stable. Atherosclerotic
calcification of the aortic knob. No focal airspace consolidation,
pleural effusion, or pneumothorax. The visualized skeletal
structures are unremarkable.
IMPRESSION: No active cardiopulmonary disease.

## 2020-07-15 NOTE — Progress Notes (Signed)
Therapist, nutritional Palliative Care Consult Note Telephone: [phone]  Fax: [phone]  PATIENT NAME: Sheila Bowman DOB: January 14, 1951 MRN: [id number]  PRIMARY CARE PROVIDER:   Toya Smothers, MD REFERRING PROVIDER:  [facility] RESPONSIBLE PARTY: [name], daughter [phone]  RECOMMENDATIONS and PLAN: 1.ACP: DNR placed Vynca 2.Palliative care encounter; Palliative medicine team will continue to support patient, patient's family, and medical team. Visit consisted of counseling and education dealing with the complex and emotionally intense issues of symptom management and palliative care in the setting of serious and potentially life-threatening illness  I spent 50 minutes providing this consultation,start at 9:00am. More than 50% of the time in this consultation was spent coordinating communication.   HISTORY OF PRESENT ILLNESS:  Sheila Bowman is a 69 y.o. year old female with multiple medical problems including Wernicke's Korsakoff syndrome,Alcoholic cirrhosis, htn, diabetes, alcoholic abuse, failure to thrive.Sheila Bowman continues to reside at Locked Memory Care unit at [facility]. Sheila Bowman ambulatory able to perform adl's, dress herself, toilet. Sheila Bowman does feed herself an appetite is good, weight has been stable. Staff reports no new changes. No recent hospitalization, wounds, infections, falls. Staff endorses Sheila Bowman has been cooperative. At present Sheila Bowman is sitting on the porch working on a word search book. Sheila Bowman is smiling, interacting with other residents. I visited and observed Sheila Bowman. We talked about purpose of palliative care visit. Sheila Bowman smiled and said "thank you". We talked about how much rain has been feeling. Sheila Bowman endorses that she is doing well. Sheila Bowman talked about her environment and that she thinks she finally has settled in. She talked about the friends that she has made there.  We talked about the word search she was doing. We talked about symptoms of pain and shortness of breath but she does not exhibit. We talked about her appetite. Sheila Bowman endorses the food is okay, "it is institutional food" but she liked some of it but not all of it. Sheila Bowman talked at length about her family, son, grandkids. We talked about role of palliative care. Most of visit supportive. Medical goals reviewed. I have attempted to contact her daughter fraud and palliative care visit. Sheila Bowman is stable and will continue to monitor and follow with next visit in 8 weeks if needed or sooner should she declined. Therapeutic listening and emotional support provided. I updated nursing staff.  Palliative Care was asked to help to continue to address goals of care.   CODE STATUS: DNR  PPS: 60% HOSPICE ELIGIBILITY/DIAGNOSIS: TBD  PAST MEDICAL HISTORY:  Past Medical History:  Diagnosis Date  . Diabetes mellitus without complication (HCC)   . Hypertension     SOCIAL HX:  Social History   Tobacco Use  . Smoking status: Former Games developer  . Smokeless tobacco: Never Used  Substance Use Topics  . Alcohol use: Yes    Comment: daily    ALLERGIES: No Known Allergies   PERTINENT MEDICATIONS:  Outpatient Encounter Medications as of 07/15/2020  Medication Sig  . feeding supplement, ENSURE ENLIVE, (ENSURE ENLIVE) LIQD Take 237 mLs by mouth 2 (two) times daily between meals.  . Multiple Vitamin (MULTIVITAMIN WITH MINERALS) TABS tablet Take 1 tablet by mouth daily.  Marland Kitchen olmesartan (BENICAR) 40 MG tablet Take 1 tablet (40 mg total) by mouth daily.   No facility-administered encounter medications on file as of 07/15/2020.    PHYSICAL EXAM:   General: NAD, pleasant female Cardiovascular: regular  rate and rhythm Pulmonary: clear ant fields Neurological: ambulatory  Sheila Bowman Prince [place], NP

## 2020-10-18 ENCOUNTER — Non-Acute Institutional Stay: Payer: Medicare Other | Admitting: Nurse Practitioner

## 2020-10-18 ENCOUNTER — Encounter: Payer: Self-pay | Admitting: Nurse Practitioner

## 2020-10-18 ENCOUNTER — Other Ambulatory Visit: Payer: Self-pay

## 2020-10-18 DIAGNOSIS — Z515 Encounter for palliative care: Secondary | ICD-10-CM

## 2020-10-18 DIAGNOSIS — F1096 Alcohol use, unspecified with alcohol-induced persisting amnestic disorder: Secondary | ICD-10-CM

## 2020-10-18 NOTE — Progress Notes (Signed)
Therapist, nutritional Palliative Care Consult Note Telephone: [phone]  Fax: [phone]  PATIENT NAME: Sheila Bowman DOB: Feb 07, 1951 MRN: [id number] PRIMARY CARE PROVIDER:   Toya Smothers, MD REFERRING PROVIDER:[facility]/Locked Memory Unit RESPONSIBLE PARTY:Kelly Carmie Kanner, daughter [phone]  RECOMMENDATIONS and PLAN: 1.ACP:DNR placed Vynca 2.Palliative care encounter; Palliative medicine team will continue to support patient, patient's family, and medical team. Visit consisted of counseling and education dealing with the complex and emotionally intense issues of symptom management and palliative care in the setting of serious and potentially life-threatening illness 3. F/u 3 months ongoing monitoring cognitive decline, weights, appetite  I spent 50 minutes providing this consultation, start at 10:10am. More than 50% of the time in this consultation was spent coordinating communication.   HISTORY OF PRESENT ILLNESS:  Sheila Bowman is a 69 y.o. year old female with multiple medical problems including Wernicke's Korsakoff syndrome,Alcoholic cirrhosis, htn, diabetes, alcoholic abuse, failure to thrive.Sheila Bowman continue to reside at the [facility] Locked memory care unit at [facility]. Ms Bowman is independent adl's the require some assistance with bathing supervision. Ms Bowman does stress yourself, toilet, feeds herself with a good appetite and weight gain. No recent, infections, falls, hospitalizations. DNR is in place. Last Primary Provider visit by Gwenevere Ghazi Nurse Practitioner, [facility] on 10 / 12 / 2021 for a routine visit. Increased confusion from baseline. Anemia continue ferrous sulfate, cirrhosis of liver continue like to lose, spironolactone, multivitamin, folic acid and thiamine, daily weights. What pressure is controlled. Labs ordered remains pending. Staff endorses no new changes for concern. At present Ms.  Bowman sitting on her bed in her room. Ms Bowman appears comfortable. No visitors present. I visited and observed Ms Bartosik. We talked about purpose of Palliative care visit. Ms Bowman in agreement. Ms Bowman made eye contact was smiling and very interactive. Sheila Bowman asked many questions. We talked about the books that were on her bed and she shared that it is hard for her to read with her difficulty with her memory and processing. We talked about how it is to continue to be at the cottage. Sheila Bowman endorses she has to be but otherwise things are going well. The food is good and the residents are nice. Sheila Bowman endorses that she does sit outside on the patio off then. Sheila Bowman was getting ready to go out this morning. We talked about symptoms of pain and shortness of breath what she did not realize. We talked about challenges with her cognitive decline. We talked about family her grandchildren and daughter. We talked about her daily routine. We talked about role Palliative care and plan of care. Medical goals review. Emotional support provided. Most of visit supportive. Ms Bowman to talk about different TV programs that she liked to watch call midsummers. Discuss that will follow up in three months if needed or sooner should she decline. Sheila Bowman in agreement. I attempted to contact her daughter for update. No new changes are recommendations at present time. Updated nursing staff.  Palliative Care was asked to help to continue to address goals of care.   CODE STATUS: DNR  PPS: 60% HOSPICE ELIGIBILITY/DIAGNOSIS: TBD  PAST MEDICAL HISTORY:  Past Medical History:  Diagnosis Date  . Diabetes mellitus without complication (HCC)   . Hypertension     SOCIAL HX:  Social History   Tobacco Use  . Smoking status: Former Games developer  . Smokeless tobacco: Never Used  Substance Use Topics  .  Alcohol use: Yes    Comment: daily    ALLERGIES: No Known Allergies   PERTINENT MEDICATIONS:  Outpatient Encounter  Medications as of 10/18/2020  Medication Sig  . feeding supplement, ENSURE ENLIVE, (ENSURE ENLIVE) LIQD Take 237 mLs by mouth 2 (two) times daily between meals.  . Multiple Vitamin (MULTIVITAMIN WITH MINERALS) TABS tablet Take 1 tablet by mouth daily.  Marland Kitchen olmesartan (BENICAR) 40 MG tablet Take 1 tablet (40 mg total) by mouth daily.   No facility-administered encounter medications on file as of 10/18/2020.    PHYSICAL EXAM:   General: NAD, pleasant female Cardiovascular: regular rate and rhythm Pulmonary: clear ant fields Neurological:Ambulatory  Sheila Khader Prince Rome, NP

## 2021-01-19 ENCOUNTER — Non-Acute Institutional Stay: Payer: Medicare Other | Admitting: Nurse Practitioner

## 2021-01-19 ENCOUNTER — Encounter: Payer: Self-pay | Admitting: Nurse Practitioner

## 2021-01-19 DIAGNOSIS — Z515 Encounter for palliative care: Secondary | ICD-10-CM

## 2021-01-19 DIAGNOSIS — F1096 Alcohol use, unspecified with alcohol-induced persisting amnestic disorder: Secondary | ICD-10-CM

## 2021-01-19 NOTE — Progress Notes (Addendum)
Therapist, nutritional Palliative Care Consult Note Telephone: [phone]  Fax: [phone]  PATIENT NAME: Sheila Bowman DOB: 09/06/1951 MRN: [id number]  PRIMARY CARE PROVIDER:[facility] RESPONSIBLE PARTY:Kelly salmon daughter [phone]  RECOMMENDATIONS and PLAN: 1.ACP:DNR in Vynca 2.Palliative care encounter; Palliative medicine team will continue to support patient, patient's family, and medical team. Visit consisted of counseling and education dealing with the complex and emotionally intense issues of symptom management and palliative care in the setting of serious and potentially life-threatening illness 3. F/u 2 months ongoing monitoring cognitive decline, weights, appetite  I spent 35 minutes providing this consultation, starting at 12:15pm. More than 50% of the time in this consultation was spent coordinating communication.   HISTORY OF PRESENT ILLNESS:  Batina Dougan is a 70 y.o. year old female with multiple medical problems including Wernicke's Korsakoff syndrome,Alcoholic cirrhosis, htn, diabetes, alcoholic abuse, failure to thrive. Sheila Bowman continues to reside at Locked Memory Care Unit [facility]. Sheila Bowman is ambulatory, and gets herself dressed. Sheila Bowman does require prompting at times for bathing. Sheila Bowman toilets herself. Sheila Bowman feeds herself and appetite has been good. No recent falls, wounds, infections, hospitalizations. Last seen by Gwenevere Ghazi Nurse Practitioner [phone] / 30 / 2021 for routine visit with complaints of body hurting all over. Anemia continued ferrous sulfate with routine monitoring a CBC. Cirrhosis of liver continued lactulose, spironolactone, multivitamin, folic acid, thiamine. HTN controlled. Hyponatremia resolved. DNR just remain in place. At present Sheila Bowman is standing in her room, upset that another resident came in her room and went to sleep in her bed. Sheila Bowman endorses  that residents did have a bowel movement in their pants and she was very upset that it contaminated her covers, floor and clothes. Sheila Bowman talked about her concerns at length. Sheila Bowman was redirectable. We talked about symptoms of pain and shortness of breath what she denies. We talked about aside from the event how other things were going. Sheila Bowman endorses things were going okay. Sheila Bowman does continue to interact with other residents. Sheila Bowman talked about going to the dining area to eat our meals. Sheila Bowman talked about concerns of leaving her room unattended. Reassurance given. Sheila Bowman did come down with Palliative care visit. Sheila Bowman was cooperative with assessment. We talked about the things that she likes to do  and her daily routine.  Sheila Bowman talked about her family her kids and grandkids. Limited discussion with cognitive impairment. Medical goals are reviewed. No new changes recommended at present time. Will continue to follow monitor Sheila. Ishee in 2 months if needed or searched she declined for ongoing regression of dementia, weight, support. Emotional support provided. Sheila Bowman expressed she was thankful for Palliative care visit today. I have attempted to contact her daughter for update on Palliative care visit. I updated nursing staff new new changes at present time.  12 / 16 / 2021 sodium 135, potassium 4.3, chloride 106, CO2 17, calcium 9.4, bun 10, Creatinine 0.62, glucose 75  Palliative Care was asked to help to continue to address goals of care.   CODE STATUS: DNR  PPS: 50% HOSPICE ELIGIBILITY/DIAGNOSIS: TBD  PAST MEDICAL HISTORY:  Past Medical History:  Diagnosis Date  . Diabetes mellitus without complication (HCC)   . Hypertension     SOCIAL HX:  Social History   Tobacco Use  . Smoking status: Former Games developer  . Smokeless tobacco: Never Used  Substance Use  Topics  . Alcohol use: Yes    Comment: daily    ALLERGIES: No Known Allergies   PHYSICAL EXAM:    General: NAD, oriented to self, pleasant female Cardiovascular: regular rate and rhythm Pulmonary: clear ant fields Neurological: ambulatory  Christin Prince Rome, NP

## 2021-01-20 ENCOUNTER — Other Ambulatory Visit: Payer: Self-pay

## 2021-01-20 ENCOUNTER — Telehealth: Payer: Self-pay | Admitting: Nurse Practitioner

## 2021-01-20 NOTE — Telephone Encounter (Signed)
I attempted to call Ms. Sheila Ferrier, [name] daughter, unable to leave a message, will continue to attempt for update on PC visit

## 2021-05-12 ENCOUNTER — Other Ambulatory Visit: Payer: Self-pay

## 2021-05-12 ENCOUNTER — Encounter: Payer: Self-pay | Admitting: Nurse Practitioner

## 2021-05-12 ENCOUNTER — Non-Acute Institutional Stay: Payer: Medicare Other | Admitting: Nurse Practitioner

## 2021-05-12 VITALS — BP 105/62 | HR 97 | Temp 97.5°F | Resp 18 | Wt 162.0 lb

## 2021-05-12 DIAGNOSIS — Z515 Encounter for palliative care: Secondary | ICD-10-CM

## 2021-05-12 DIAGNOSIS — R413 Other amnesia: Secondary | ICD-10-CM

## 2021-05-12 NOTE — Progress Notes (Signed)
[facility] Palliative Care Consult Note Telephone: [phone]  Fax: [phone]    Date of encounter: 05/12/21 PATIENT NAME: Sheila Bowman [address] [place] [address]   [phone] (home)  DOB: [birth year] MRN: [id number] PRIMARY CARE PROVIDER:   Irene Limbo NP/Sheila Bowman, Jadene Pierini, MD,  Sheila Bowman [phone]  RESPONSIBLE PARTY:    Contact Information    Name Relation Home Work Mobile   [place]atherford,Grant Brother [phone]     Samuella Cota   [phone]     I met face to face with patient and staff facility. Palliative Care was asked to follow this patient by consultation request of  Sheila Limbo NP to address advance care planning and complex medical decision making. This is a follow up visit.  ASSESSMENT AND PLAN / RECOMMENDATIONS:   Symptom Management/Plan: 1.ACP:DNR in Vynca  2.Palliative care encounter; Palliative medicine team will continue to support patient, patient's family, and medical team. Visit consisted of counseling and education dealing with the complex and emotionally intense issues of symptom management and palliative care in the setting of serious and potentially life-threatening illness  3. F/u 2 months ongoing monitoring cognitive decline, weights, appetite  4. 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 Return 8 weeks or prn.  I spent 45 minutes providing this consultation starting at 12:15pm. More than 50% of the time in this consultation was spent in counseling and care coordination.  PPS: 50%  HOSPICE ELIGIBILITY/DIAGNOSIS: TBD  Chief Complaint: Follow up visit for palliative consult for complex medical decision making  HISTORY OF PRESENT  ILLNESS:  Sheila Bowman is a 70 y.o. year old female  with Wernicke's Korsakoff syndrome,Alcoholic cirrhosis, htn, diabetes, alcoholic abuse, failure to thrive. Sheila Bowman continues to reside at locked memory care unit at [facility]. Sheila Bowman is fairly independent as she is able to bathe with assistance, dress herself. Sheila Bowman is able to toilet herself. Sheila Bowman does feed herself with a weight gain. Staff endorses no new changes or concerns. No recent wounds, falls, hospitalizations, infections. Behaviors have been stable. Last seen by primary provider Sheila Bowman Nurse Practitioner 03/30/2021 for routine visits for cirrhosis the liver continued lactulose, spironolactone, multivitamin,  folic acid, thiamine. continuing monitoring daily weights with low sodium diet period anemia on ferrous sulfate stable. Anxiety with depression on zoloft, buspar with prn ativan. Allergies on loratadine. Hypertension on olmesartin. Norvasc, spironolactone with stable blood pressures.  Palliative visit for ongoing monitoring of chronic disease progression of dementia in the setting of liver cirrhosis. Staff endorses Sheila Bowman continues to take her last jellos. I visited and observed Sheila Bowman as she was lying in her bed in her room. Staff endorses she spends most of her time in her room on occasion she will sit outside in the rocking chair. Sheila Bowman and I talked about purpose of palliative care visit. Sheila Bowman in agreement. We talked about how she was feeling. Sheila Bowman endorses she is doing well. We talked about symptoms of pain and shortness of breath which she denies. We talked about her appetite. Sheila. Bowman endorses that her appetite is too good she is eating too much. Sheila Bowman endorses she has gained over 20 pounds. We talked about her abdomen which appearsswollen to her. We talked about Sheila Bowman daily  routine as she shared that she spends most of her time in herroom period this Sheila Bowman endorses that she has not been very  social lately. Sheila Bowman talked about the book that she was getting ready to read but has not started yet. We talked about daily routine. We talked about the importance of socialization. Sheila Bowman talked about her grandchildren, son and daughter. We talked aboutresiding at locked memory care unit. Sheila Bowman endorsesThat she is content there as they help her a lot. Sheila Bowman was cooperative with assessment. Emotional support provided. Medical goals reviewed. No new changes recommended will follow up in two months though close monitoring with increased weight and abdominal girth. I have attempted to contact daughter for update on palliative care visit. I updated nursing staff no new changes at present time.   Last labs 12/05/2021 sodium 135, potassium 4.0, chloride 105, Co 216, calcium 9.4, bun 13, creatinine 0.83, glucose 92, wbc 4.3, hemoglobin 10.7, hematocrit 30.2, platelets 102  History obtained from review of EMR, discussion with  and interview with facility staff and  Sheila. Sheila Bowman.  I reviewed available labs, medications, imaging, studies and related documents from the EMR.  Records reviewed and summarized above.   ROS Full 14 system review of systems performed and negative with exception of: as per HPI.  Physical Exam: Constitutional: NAD General: chronically ill, pleasant with mild cognitive impairment obese female EYES: lids intact ENMT: intact hearing, oral mucous membranes moist CV: S1S2, RRR, no LE edema Pulmonary: LCTA, no increased work of breathing, no cough, room air Abdomen: intake 100%, normo-active BS + 4 quadrants, soft and non tender,  GU: deferred MSK:  ambulatory Skin: warm and dry, no rashes or wounds on visible skin Neuro:  no generalized weakness,  Mild cognitive impairment Psych: non-anxious affect, A and O x 3   Questions and concerns were addressed. The patient/family was encouraged to call with questions and/or concerns. My business card was provided. Provided general  support and encouragement, no other unmet needs identified  Thank you for the opportunity to participate in the care of Sheila. Dorado.  The palliative care team will continue to follow. Please call our office at [phone] if we can be of additional assistance.   This chart was dictated using voice recognition software. Despite best efforts to proofread, errors can occur which can change the documentation meaning.   Kanton Kamel Ihor Gully, NP , MSN, [facility]

## 2021-08-03 ENCOUNTER — Non-Acute Institutional Stay: Payer: Medicare Other | Admitting: Nurse Practitioner

## 2021-08-03 ENCOUNTER — Encounter: Payer: Self-pay | Admitting: Nurse Practitioner

## 2021-08-03 VITALS — HR 78 | Resp 18 | Wt 166.3 lb

## 2021-08-03 DIAGNOSIS — F1096 Alcohol use, unspecified with alcohol-induced persisting amnestic disorder: Secondary | ICD-10-CM

## 2021-08-03 DIAGNOSIS — Z515 Encounter for palliative care: Secondary | ICD-10-CM

## 2021-08-03 DIAGNOSIS — R413 Other amnesia: Secondary | ICD-10-CM

## 2021-08-03 NOTE — Progress Notes (Signed)
[facility] Consult Note Telephone: [phone]  Fax: [phone]    Date of encounter: 08/03/21 PATIENT NAME: Sheila Bowman [address] [place] [address]   [phone] (home)  DOB: 1951/03/17 MRN: [id number] PRIMARY CARE PROVIDER:   Kandis Mannan  RESPONSIBLE PARTY:    Contact Information     Name Relation Home Work Mobile   Niehoff,Grant Brother [phone]     Samuella Cota   [phone]      I met face to face with patient in facility. Palliative Care was asked to follow this patient by consultation request of Kandis Mannan to address advance care planning and complex medical decision making. This is a follow up visit.                                  ASSESSMENT AND PLAN / RECOMMENDATIONS:   Symptom Management/Plan: 1. ACP: DNR in [place]    2. Palliative care encounter; Palliative medicine team will continue to support patient, patient's family, and medical team. Visit consisted of counseling and education dealing with the complex and emotionally intense issues of symptom management and palliative care in the setting of serious and potentially life-threatening illness   3. F/u 2 months ongoing monitoring cognitive decline, weights, appetite   4. Memory impairment worsening since covid secondary to dementia, continue supportive role, monitor progression  Follow up Palliative Care Visit: Palliative care will continue to follow for complex medical decision making, advance care planning, and clarification of goals. Return 12 weeks or prn.  I spent 47 minutes providing this consultation. More than 50% of the time in this consultation was spent in counseling and care coordination.  PPS: 50%  Chief Complaint: Follow UP Palliative consult for complex medical decision making  HISTORY OF PRESENT ILLNESS:  Sheila Bowman is a 70 y.o. year old female  with multiple medical problems including Wernicke's Korsakoff syndrome,  Alcoholic cirrhosis, htn, diabetes, alcoholic abuse, failure to thrive. Sheila Bowman continues to reside at locked memory care unit at [facility]. Sheila Bowman is fairly independent as she is able to bathe with assistance, dress herself. Sheila Bowman is able to toilet herself. Sheila Bowman does feed herself with a weight gain. Staff endorses no new changes or concerns. No recent wounds, falls, hospitalizations. Last primary provider visit by Rhodia Albright [id number] for routine visit with recent recovery from [place] continued on supplement including zinc, vitamin B complex and vitamin C Then discontinued due to diarrhea with GI upset. No complaints. Anxiety and depression continue zoloft and buspar. Anemia continue FERROUS sulfate, cirrhosis of liver continued lactose, spiralactone, multivitamin, folic acid in theme in with daily weights. Staff endorses no other changes. I visited and observed Sheila Bowman, she was in her room sitting on her bed. Sheila Bowman was in agreement to visit today. Sheila Bowman and I talked about being sick with covid. Sheila Bowman endorses she has not been able to read like she was previously did prior to covid. We talked about how she has been feeling. We talked about her daily routine. Sheila Bowman endorses she spends most of her time in her room. We talked about socializing, Sheila Bowman endorses she will try. We talked about symptoms of pain which she denies. We talked about her appetite which has been fair. We talked about foods that she likes. Sheila Bowman talked about her grandson's, son. Sheila Bowman talked about wanting to go  visit her grandson's. Sheila Bowman talked about her son coaching baseball for her grandson's. We talked about about things that make her happy. Sheila Bowman endorses she is happy where she is. We talked about role pc in poc. Sheila Bowman endorses she was very glad to have PC visit and request to continue visits. Emotional support. Medical goals reviewed. I attempted to reach Sheila. Jaster daughter for  update on pc visit, though no new recommendations, continue current poc. I updated staff.   07/25/2021 WBC 5.0, hemoglobin 9.4, hematocrit 27.7, platelets 88, hemoglobin A1 C 5.7, vitamin D 31.9, sodium 142, potassium 4.5, chloride 112, CO2 17, calcium 8.8, BUN 13, creatinine 0.88, glucose 94, albumin 3.6, total protein 5.8, AST 24, ALT19, TSH 2.850   History obtained from review of EMR, discussion with facility staff and  Sheila. [name].  I reviewed available labs, medications, imaging, studies and related documents from the EMR.  Records reviewed and summarized above.   ROS Full 14 system review of systems performed and negative with exception of: as per HPI.   Physical Exam: Constitutional: NAD General: pleasant confused female EYES: lids intact ENMT: oral mucous membranes moist,  CV: S1S2, RRR Pulmonary: LCTA, no increased work of breathing, no cough, room air Abdomen: intake 100%, normo-active BS + 4 quadrants, soft and non tender MSK: ambulatory Skin: warm and dry Neuro:  no generalized weakness,  + cognitive impairment Psych: non-anxious affect, A and O x 3  Questions and concerns were addressed. The facility staff was encouraged to call with questions and/or concerns. Provided general support and encouragement, no other unmet needs identified   Thank you for the opportunity to participate in the care of Sheila. [name].  The palliative care team will continue to follow. Please call our office at [phone] if we can be of additional assistance.   This chart was dictated using voice recognition software.  Despite best efforts to proofread,  errors can occur which can change the documentation meaning.   Evanie Buckle Ihor Gully, NP

## 2021-08-04 ENCOUNTER — Other Ambulatory Visit: Payer: Self-pay

## 2021-08-28 ENCOUNTER — Other Ambulatory Visit: Payer: Self-pay

## 2021-08-28 ENCOUNTER — Inpatient Hospital Stay
Admission: EM | Admit: 2021-08-28 | Discharge: 2021-09-01 | DRG: 445 | Disposition: A | Discharge status: Skilled Nursing Facility | Payer: Medicare Other | Source: Skilled Nursing Facility | Attending: Hospitalist | Admitting: Hospitalist

## 2021-08-28 ENCOUNTER — Emergency Department: Payer: Medicare Other

## 2021-08-28 DIAGNOSIS — K7030 Alcoholic cirrhosis of liver without ascites: Secondary | ICD-10-CM | POA: Diagnosis present

## 2021-08-28 DIAGNOSIS — F1096 Alcohol use, unspecified with alcohol-induced persisting amnestic disorder: Secondary | ICD-10-CM | POA: Diagnosis present

## 2021-08-28 DIAGNOSIS — D696 Thrombocytopenia, unspecified: Secondary | ICD-10-CM | POA: Diagnosis present

## 2021-08-28 DIAGNOSIS — Z87891 Personal history of nicotine dependence: Secondary | ICD-10-CM | POA: Diagnosis not present

## 2021-08-28 DIAGNOSIS — Z20822 Contact with and (suspected) exposure to covid-19: Secondary | ICD-10-CM | POA: Diagnosis present

## 2021-08-28 DIAGNOSIS — R1011 Right upper quadrant pain: Secondary | ICD-10-CM | POA: Diagnosis not present

## 2021-08-28 DIAGNOSIS — E872 Acidosis: Secondary | ICD-10-CM

## 2021-08-28 DIAGNOSIS — R10811 Right upper quadrant abdominal tenderness: Secondary | ICD-10-CM

## 2021-08-28 DIAGNOSIS — I10 Essential (primary) hypertension: Secondary | ICD-10-CM | POA: Diagnosis present

## 2021-08-28 DIAGNOSIS — E861 Hypovolemia: Secondary | ICD-10-CM | POA: Diagnosis present

## 2021-08-28 DIAGNOSIS — F32A Depression, unspecified: Secondary | ICD-10-CM | POA: Diagnosis present

## 2021-08-28 DIAGNOSIS — K8050 Calculus of bile duct without cholangitis or cholecystitis without obstruction: Principal | ICD-10-CM | POA: Diagnosis present

## 2021-08-28 DIAGNOSIS — F0390 Unspecified dementia without behavioral disturbance: Secondary | ICD-10-CM | POA: Diagnosis present

## 2021-08-28 DIAGNOSIS — R799 Abnormal finding of blood chemistry, unspecified: Secondary | ICD-10-CM | POA: Diagnosis not present

## 2021-08-28 DIAGNOSIS — E119 Type 2 diabetes mellitus without complications: Secondary | ICD-10-CM | POA: Diagnosis present

## 2021-08-28 DIAGNOSIS — Z66 Do not resuscitate: Secondary | ICD-10-CM | POA: Diagnosis present

## 2021-08-28 DIAGNOSIS — E871 Hypo-osmolality and hyponatremia: Secondary | ICD-10-CM

## 2021-08-28 DIAGNOSIS — Z8249 Family history of ischemic heart disease and other diseases of the circulatory system: Secondary | ICD-10-CM | POA: Diagnosis not present

## 2021-08-28 DIAGNOSIS — D649 Anemia, unspecified: Secondary | ICD-10-CM | POA: Diagnosis present

## 2021-08-28 DIAGNOSIS — F419 Anxiety disorder, unspecified: Secondary | ICD-10-CM | POA: Diagnosis present

## 2021-08-28 DIAGNOSIS — Z79899 Other long term (current) drug therapy: Secondary | ICD-10-CM | POA: Diagnosis not present

## 2021-08-28 LAB — CBC WITH DIFFERENTIAL/PLATELET
Abs Immature Granulocytes: 0.02 10*3/uL (ref 0.00–0.07)
Basophils Absolute: 0 10*3/uL (ref 0.0–0.1)
Basophils Relative: 0 %
Eosinophils Absolute: 0.2 10*3/uL (ref 0.0–0.5)
Eosinophils Relative: 3 %
HCT: 28.3 % — ABNORMAL LOW (ref 36.0–46.0)
Hemoglobin: 10.3 g/dL — ABNORMAL LOW (ref 12.0–15.0)
Immature Granulocytes: 0 %
Lymphocytes Relative: 25 %
Lymphs Abs: 1.3 10*3/uL (ref 0.7–4.0)
MCH: 35.5 pg — ABNORMAL HIGH (ref 26.0–34.0)
MCHC: 36.4 g/dL — ABNORMAL HIGH (ref 30.0–36.0)
MCV: 97.6 fL (ref 80.0–100.0)
Monocytes Absolute: 0.8 10*3/uL (ref 0.1–1.0)
Monocytes Relative: 15 %
Neutro Abs: 2.9 10*3/uL (ref 1.7–7.7)
Neutrophils Relative %: 57 %
Platelets: 91 10*3/uL — ABNORMAL LOW (ref 150–400)
RBC: 2.9 MIL/uL — ABNORMAL LOW (ref 3.87–5.11)
RDW: 12.5 % (ref 11.5–15.5)
WBC: 5.2 10*3/uL (ref 4.0–10.5)
nRBC: 0 % (ref 0.0–0.2)

## 2021-08-28 LAB — LIPASE, BLOOD: Lipase: 53 U/L — ABNORMAL HIGH (ref 11–51)

## 2021-08-28 LAB — COMPREHENSIVE METABOLIC PANEL
ALT: 15 U/L (ref 0–44)
AST: 38 U/L (ref 15–41)
Albumin: 3.5 g/dL (ref 3.5–5.0)
Alkaline Phosphatase: 80 U/L (ref 38–126)
Anion gap: 6 (ref 5–15)
BUN: 16 mg/dL (ref 8–23)
CO2: 19 mmol/L — ABNORMAL LOW (ref 22–32)
Calcium: 9.1 mg/dL (ref 8.9–10.3)
Chloride: 108 mmol/L (ref 98–111)
Creatinine, Ser: 0.99 mg/dL (ref 0.44–1.00)
GFR, Estimated: >60 mL/min (ref >60)
Glucose, Bld: 130 mg/dL — ABNORMAL HIGH (ref 70–99)
Potassium: 4.4 mmol/L (ref 3.5–5.1)
Sodium: 133 mmol/L — ABNORMAL LOW (ref 135–145)
Total Bilirubin: 1.7 mg/dL — ABNORMAL HIGH (ref 0.3–1.2)
Total Protein: 6.6 g/dL (ref 6.5–8.1)

## 2021-08-28 LAB — CBG MONITORING, ED: Glucose-Capillary: 138 mg/dL — ABNORMAL HIGH (ref 70–99)

## 2021-08-28 LAB — RESP PANEL BY RT-PCR (FLU A&B, COVID) ARPGX2
Influenza A by PCR: NEGATIVE
Influenza B by PCR: NEGATIVE
SARS Coronavirus 2 by RT PCR: NEGATIVE

## 2021-08-28 MED ORDER — ONDANSETRON HCL 4 MG PO TABS: 4.0000 mg | ORAL_TABLET | Freq: Four times a day (QID) | ORAL | Status: DC | PRN

## 2021-08-28 MED ORDER — SODIUM CHLORIDE 0.9 % IV SOLN: INTRAVENOUS | Status: DC

## 2021-08-28 MED ORDER — ADULT MULTIVITAMIN W/MINERALS CH
1.0000 | ORAL_TABLET | Freq: Every day | ORAL | Status: DC
  Administered 2021-08-29 – 2021-09-01 (×4): 1 via ORAL
  Filled 2021-08-28 (×4): qty 1

## 2021-08-28 MED ORDER — ENOXAPARIN SODIUM 40 MG/0.4ML IJ SOSY: 40.0000 mg | PREFILLED_SYRINGE | INTRAMUSCULAR | Status: DC

## 2021-08-28 MED ORDER — MORPHINE SULFATE (PF) 2 MG/ML IV SOLN: 1.0000 mg | INTRAVENOUS | Status: DC | PRN

## 2021-08-28 MED ORDER — ACETAMINOPHEN 325 MG PO TABS
650.0000 mg | ORAL_TABLET | Freq: Four times a day (QID) | ORAL | Status: DC | PRN
  Administered 2021-08-29: 650 mg via ORAL
  Filled 2021-08-28: qty 2

## 2021-08-28 MED ORDER — INSULIN ASPART 100 UNIT/ML IJ SOLN
0.0000 [IU] | Freq: Three times a day (TID) | INTRAMUSCULAR | Status: DC
  Administered 2021-08-28: 1 [IU] via SUBCUTANEOUS
  Administered 2021-08-29: 2 [IU] via SUBCUTANEOUS
  Filled 2021-08-28 (×2): qty 1

## 2021-08-28 MED ORDER — THIAMINE HCL 100 MG PO TABS
100.0000 mg | ORAL_TABLET | Freq: Every day | ORAL | Status: DC
  Administered 2021-08-29 – 2021-09-01 (×4): 100 mg via ORAL
  Filled 2021-08-28 (×4): qty 1

## 2021-08-28 MED ORDER — FERROUS SULFATE 325 (65 FE) MG PO TABS
325.0000 mg | ORAL_TABLET | Freq: Every day | ORAL | Status: DC
  Administered 2021-08-29 – 2021-09-01 (×4): 325 mg via ORAL
  Filled 2021-08-28 (×4): qty 1

## 2021-08-28 MED ORDER — LORAZEPAM 1 MG PO TABS
1.0000 mg | ORAL_TABLET | Freq: Once | ORAL | Status: AC
  Administered 2021-08-28: 1 mg via ORAL
  Filled 2021-08-28: qty 1

## 2021-08-28 MED ORDER — GADOBUTROL 1 MMOL/ML IV SOLN
7.5000 mL | Freq: Once | INTRAVENOUS | Status: AC | PRN
  Administered 2021-08-28: 7.5 mL via INTRAVENOUS

## 2021-08-28 MED ORDER — FOLIC ACID 1 MG PO TABS
1.0000 mg | ORAL_TABLET | Freq: Every day | ORAL | Status: DC
  Administered 2021-08-29 – 2021-09-01 (×4): 1 mg via ORAL
  Filled 2021-08-28 (×4): qty 1

## 2021-08-28 MED ORDER — BUSPIRONE HCL 5 MG PO TABS
5.0000 mg | ORAL_TABLET | Freq: Two times a day (BID) | ORAL | Status: DC
  Administered 2021-08-29 – 2021-09-01 (×7): 5 mg via ORAL
  Filled 2021-08-28 (×9): qty 1

## 2021-08-28 MED ORDER — PROGESTERONE MICRONIZED 100 MG PO CAPS
200.0000 mg | ORAL_CAPSULE | Freq: Every day | ORAL | Status: DC
  Administered 2021-08-29 – 2021-08-31 (×3): 200 mg via ORAL
  Filled 2021-08-28 (×4): qty 2

## 2021-08-28 MED ORDER — IRBESARTAN 75 MG PO TABS
37.5000 mg | ORAL_TABLET | Freq: Every day | ORAL | Status: DC
  Administered 2021-08-29 – 2021-09-01 (×4): 37.5 mg via ORAL
  Filled 2021-08-28 (×4): qty 0.5

## 2021-08-28 MED ORDER — SERTRALINE HCL 50 MG PO TABS
25.0000 mg | ORAL_TABLET | Freq: Every day | ORAL | Status: DC
  Administered 2021-08-29 – 2021-09-01 (×4): 25 mg via ORAL
  Filled 2021-08-28 (×4): qty 1

## 2021-08-28 MED ORDER — ACETAMINOPHEN 650 MG RE SUPP: 650.0000 mg | Freq: Four times a day (QID) | RECTAL | Status: DC | PRN

## 2021-08-28 MED ORDER — ONDANSETRON HCL 4 MG/2ML IJ SOLN
4.0000 mg | Freq: Four times a day (QID) | INTRAMUSCULAR | Status: DC | PRN
  Administered 2021-08-31: 4 mg via INTRAVENOUS
  Filled 2021-08-28: qty 2

## 2021-08-28 MED ORDER — AMLODIPINE BESYLATE 5 MG PO TABS
5.0000 mg | ORAL_TABLET | Freq: Every day | ORAL | Status: DC
  Administered 2021-08-29 – 2021-09-01 (×4): 5 mg via ORAL
  Filled 2021-08-28 (×4): qty 1

## 2021-08-28 MED ORDER — LORATADINE 10 MG PO TABS
10.0000 mg | ORAL_TABLET | Freq: Every day | ORAL | Status: DC
  Administered 2021-08-29 – 2021-09-01 (×4): 10 mg via ORAL
  Filled 2021-08-28 (×4): qty 1

## 2021-08-28 MED ORDER — LACTULOSE 10 GM/15ML PO SOLN: 20.0000 g | Freq: Two times a day (BID) | ORAL | Status: DC | PRN

## 2021-08-28 MED ORDER — SPIRONOLACTONE 25 MG PO TABS
25.0000 mg | ORAL_TABLET | Freq: Every day | ORAL | Status: DC
  Administered 2021-08-29 – 2021-09-01 (×4): 25 mg via ORAL
  Filled 2021-08-28 (×4): qty 1

## 2021-08-28 MED ORDER — PIPERACILLIN-TAZOBACTAM 3.375 G IVPB 30 MIN
3.3750 g | Freq: Once | INTRAVENOUS | Status: AC
  Administered 2021-08-28: 3.375 g via INTRAVENOUS
  Filled 2021-08-28: qty 50

## 2021-08-28 MED ORDER — TRAZODONE HCL 50 MG PO TABS: 25.0000 mg | ORAL_TABLET | Freq: Every evening | ORAL | Status: DC | PRN

## 2021-08-28 NOTE — ED Triage Notes (Signed)
Patient to ED via EMS from [facility]. Per EMS, patient had an abnormal [place]. Patient has hx of dementia and is unreliable historian. Per paperwork brought with pt, possible choledocholithiasis. Patient denies pain at this time.

## 2021-08-28 NOTE — H&P (Addendum)
[place]   PATIENT NAME: Sheila Bowman    MR#:  [id number]  DATE OF BIRTH:  06/30/1951  DATE OF ADMISSION:  08/28/2021  PRIMARY CARE PHYSICIAN: Aderoju, Marin Olp, MD   [facility] from[facility]  REQUESTING/REFERRING PHYSICIAN: Delton [place], MD  CHIEF COMPLAINT:   Chief Complaint  Patient presents with   Abnormal Ultrasound    HISTORY OF PRESENT ILLNESS:  Sheila Bowman is a 70 y.o. female with medical history significant for type 2 diabetes mellitus, dementia and hypertension, who presented to the emergency room after having an abnormal right upper quadrant ultrasound with common bile duct dilatation of 10.3 mm and suspected choledocholithiasis.  The patient has apparently been having mild epigastric and right upper quadrant abdominal pain and tenderness discovered on physical exam, without nausea and vomiting at her facility.  She denied any worsening diarrhea beyond her usual.  She denies any fever or chills.  No chest pain or dyspnea.  No cough or wheezing.  No bleeding diathesis.  No dysuria, oliguria or urinary frequency or urgency or flank pain.   ED Course: Upon presentation to the ER, vital signs are within normal and later heart rate was 51.  She has been afebrile.  Labs were remarkable for a CO2 of 19 with mild hyponatremia of 133 and total bili 1.7 with otherwise normal LFTs.  Serum lipase level was 53.  CBC showed anemia with hemoglobin of 10.3 hematocrit 28.3 which are better than previous levels in May of last year and platelets were 91 compared to 59 then.  Influenza antigens and COVID-19 PCR came back negative.    EKG: I ordered that and it is currently pending. Imaging: MRCP revealed the following: 1. Examination is generally limited by breath motion artifact throughout. There is mild intra and extrahepatic biliary ductal dilatation, common bile duct measuring up to 1.2 cm. Despite limitations of the examination, suspect a small  gallstone near the ampulla measuring approximately 4 mm. 2. Somewhat coarse, nodular appearance of the liver, suggestive of cirrhosis.  The patient was given 1 mg of IV Ativan as well as IV Zosyn.  Contact was made with Dr. Mia Creek who is arranging for ERCP with Dr. Servando Snare, possibly tomorrow.  She will be admitted to a medical bed for further evaluation and management.  PAST MEDICAL HISTORY:   Past Medical History:  Diagnosis Date   Diabetes mellitus without complication (HCC)    Hypertension   -Dementia  PAST SURGICAL HISTORY:  History reviewed. No pertinent surgical history.  She denies any previous surgeries.  SOCIAL HISTORY:   Social History   Tobacco Use   Smoking status: Former   Smokeless tobacco: Never  Substance Use Topics   Alcohol use: Yes    Comment: daily    FAMILY HISTORY:   Family History  Problem Relation Age of Onset   Heart attack Father     DRUG ALLERGIES:  No Known Allergies  REVIEW OF SYSTEMS:   ROS As per history of present illness. All pertinent systems were reviewed above. Constitutional, HEENT, cardiovascular, respiratory, GI, GU, musculoskeletal, neuro, psychiatric, endocrine, integumentary and hematologic systems were reviewed and are otherwise negative/unremarkable except for positive findings mentioned above in the HPI.   MEDICATIONS AT HOME:   Prior to Admission medications   Medication Sig Start Date End Date Taking? Authorizing Provider  feeding supplement, ENSURE ENLIVE, (ENSURE ENLIVE) LIQD Take 237 mLs by mouth 2 (two) times daily between meals. 02/25/20  Enedina FinnerPatel, Sona, MD  Multiple Vitamin (MULTIVITAMIN WITH MINERALS) TABS tablet Take 1 tablet by mouth daily. 02/26/20   Enedina FinnerPatel, Sona, MD  olmesartan (BENICAR) 40 MG tablet Take 1 tablet (40 mg total) by mouth daily. 02/25/20 02/24/21  Enedina FinnerPatel, Sona, MD      VITAL SIGNS:  Blood pressure (!) 108/59, pulse (!) 51, temperature 98.8 F (37.1 C), temperature source Oral, resp. rate 20, SpO2  97 %.  PHYSICAL EXAMINATION:  Physical Exam  GENERAL:  70 y.o.-year-old Caucasian female patient lying in the bed with no acute distress.  EYES: Pupils equal, round, reactive to light and accommodation. No scleral icterus. Extraocular muscles intact.  HEENT: Head atraumatic, normocephalic. Oropharynx and nasopharynx clear.  NECK:  Supple, no jugular venous distention. No thyroid enlargement, no tenderness.  LUNGS: Normal breath sounds bilaterally, no wheezing, rales,rhonchi or crepitation. No use of accessory muscles of respiration.  CARDIOVASCULAR: Regular rate and rhythm, S1, S2 normal. No murmurs, rubs, or gallops.  ABDOMEN: Soft, nondistended, with right upper quadrant tenderness without rebound tenderness guarding or rigidity.. Bowel sounds present. No organomegaly or mass.  EXTREMITIES: No pedal edema, cyanosis, or clubbing.  NEUROLOGIC: Cranial nerves II through XII are intact. Muscle strength 5/5 in all extremities. Sensation intact. Gait not checked.  PSYCHIATRIC: The patient is alert and oriented x 3.  Normal affect and good eye contact. SKIN: No obvious rash, lesion, or ulcer.   LABORATORY PANEL:   CBC Recent Labs  Lab 08/28/21 1530  WBC 5.2  HGB 10.3*  HCT 28.3*  PLT 91*   ------------------------------------------------------------------------------------------------------------------  Chemistries  Recent Labs  Lab 08/28/21 1530  NA 133*  K 4.4  CL 108  CO2 19*  GLUCOSE 130*  BUN 16  CREATININE 0.99  CALCIUM 9.1  AST 38  ALT 15  ALKPHOS 80  BILITOT 1.7*   ------------------------------------------------------------------------------------------------------------------  Cardiac Enzymes No results for input(s): TROPONINI in the last 168 hours. ------------------------------------------------------------------------------------------------------------------  RADIOLOGY:  MR 3D Recon At Scanner  Result Date: 08/28/2021 CLINICAL DATA:  Epigastric pain,  reported outside right upper quadrant ultrasound with dilated CBD EXAM: MRI ABDOMEN WITHOUT AND WITH CONTRAST (INCLUDING MRCP) TECHNIQUE: Multiplanar multisequence MR imaging of the abdomen was performed both before and after the administration of intravenous contrast. Heavily T2-weighted images of the biliary and pancreatic ducts were obtained, and three-dimensional MRCP images were rendered by post processing. CONTRAST:  7.175mL GADAVIST GADOBUTROL 1 MMOL/ML IV SOLN COMPARISON:  CT abdomen pelvis, 05/01/2020 FINDINGS: Examination is generally limited by breath motion artifact throughout Lower chest: No acute findings. Hepatobiliary: Somewhat coarse, nodular appearance of the liver. No mass or other parenchymal abnormality identified. Mild intra and extrahepatic biliary ductal dilatation, common bile duct measuring up to 1.2 cm. Despite limitations of the examination, suspect a small gallstone near the ampulla measuring 4 mm (series 5, image 21, series 2, image 15). Pancreas: No mass, inflammatory changes, or other parenchymal abnormality identified. No pancreatic ductal dilatation. Spleen:  Within normal limits in size and appearance. Adrenals/Urinary Tract: No masses identified. No evidence of hydronephrosis. Stomach/Bowel: Visualized portions within the abdomen are unremarkable. Vascular/Lymphatic: No pathologically enlarged lymph nodes identified. No abdominal aortic aneurysm demonstrated. Other:  None. Musculoskeletal: No suspicious bone lesions identified. IMPRESSION: 1. Examination is generally limited by breath motion artifact throughout. There is mild intra and extrahepatic biliary ductal dilatation, common bile duct measuring up to 1.2 cm. Despite limitations of the examination, suspect a small gallstone near the ampulla measuring approximately 4 mm. 2. Somewhat coarse, nodular appearance of the  liver, suggestive of cirrhosis. Electronically Signed   By: Lauralyn Primes M.D.   On: 08/28/2021 19:41   MR  ABDOMEN MRCP W WO CONTAST  Result Date: 08/28/2021 CLINICAL DATA:  Epigastric pain, reported outside right upper quadrant ultrasound with dilated CBD EXAM: MRI ABDOMEN WITHOUT AND WITH CONTRAST (INCLUDING MRCP) TECHNIQUE: Multiplanar multisequence MR imaging of the abdomen was performed both before and after the administration of intravenous contrast. Heavily T2-weighted images of the biliary and pancreatic ducts were obtained, and three-dimensional MRCP images were rendered by post processing. CONTRAST:  7.34mL GADAVIST GADOBUTROL 1 MMOL/ML IV SOLN COMPARISON:  CT abdomen pelvis, 05/01/2020 FINDINGS: Examination is generally limited by breath motion artifact throughout Lower chest: No acute findings. Hepatobiliary: Somewhat coarse, nodular appearance of the liver. No mass or other parenchymal abnormality identified. Mild intra and extrahepatic biliary ductal dilatation, common bile duct measuring up to 1.2 cm. Despite limitations of the examination, suspect a small gallstone near the ampulla measuring 4 mm (series 5, image 21, series 2, image 15). Pancreas: No mass, inflammatory changes, or other parenchymal abnormality identified. No pancreatic ductal dilatation. Spleen:  Within normal limits in size and appearance. Adrenals/Urinary Tract: No masses identified. No evidence of hydronephrosis. Stomach/Bowel: Visualized portions within the abdomen are unremarkable. Vascular/Lymphatic: No pathologically enlarged lymph nodes identified. No abdominal aortic aneurysm demonstrated. Other:  None. Musculoskeletal: No suspicious bone lesions identified. IMPRESSION: 1. Examination is generally limited by breath motion artifact throughout. There is mild intra and extrahepatic biliary ductal dilatation, common bile duct measuring up to 1.2 cm. Despite limitations of the examination, suspect a small gallstone near the ampulla measuring approximately 4 mm. 2. Somewhat coarse, nodular appearance of the liver, suggestive of  cirrhosis. Electronically Signed   By: Lauralyn Primes M.D.   On: 08/28/2021 19:41      IMPRESSION AND PLAN:  Active Problems:   Choledocholithiasis  1.  Symptomatic choledocholithiasis. - The patient be admitted to a medical bed. - Pain management to be provided. - Should be hydrated with IV normal saline. - We will place her on clear liquids then keep her n.p.o. after midnight. - We will follow her LFTs and lipase.  2.  Hyponatremia with mild metabolic acidosis. - This is likely hypovolemic. - She will be hydrated with IV normal saline and will follow her sodium level.  3.  Essential hypertension. - We will continue her amlodipine and HCTZ.  3.  Type 2 diabetes mellitus, without complications possibly diet managed. - The patient will be placed on supplement coverage with NovoLog.  4.  Depression and anxiety. - We will continue her BuSpar, Ativan and Zoloft.  DVT prophylaxis: Lovenox. Code Status: The patient is DNR/DNI. Family Communication:  The plan of care was discussed in details with the patient (and family). I answered all questions. The patient agreed to proceed with the above mentioned plan. Further management will depend upon hospital course. Disposition Plan: Back to previous home environment Consults called: Gastroenterology All the records are reviewed and case discussed with ED provider.  Status is: Inpatient  Remains inpatient appropriate because:Ongoing diagnostic testing needed not appropriate for outpatient work up, Unsafe d/c plan, IV treatments appropriate due to intensity of illness or inability to take PO, and Inpatient level of care appropriate due to severity of illness  Dispo: The patient is from: SNF.              Anticipated d/c is to: SNF.  Patient currently is not medically stable to d/c.   Difficult to place patient No   TOTAL TIME TAKING CARE OF THIS PATIENT: 55 minutes.    Hannah Beat M.D on 08/28/2021 at 8:46 PM  [facility]   From 7 PM-7 AM, contact night-coverage [email or web address]  CC: Primary care physician; Aderoju, Marin Olp, MD

## 2021-08-28 NOTE — ED Notes (Signed)
Pt sleeping. 

## 2021-08-28 NOTE — ED Provider Notes (Signed)
[facility] Emergency Department Provider Note ____________________________________________   Event Date/Time   First MD Initiated Contact with Patient 08/28/21 [phone]     (approximate)  I have reviewed the triage vital signs and the nursing notes.  HISTORY  Chief Complaint Abnormal Ultrasound   HPI Sheila Bowman is a 70 y.o. femalewho presents to the ED for evaluation of abnormal outpatient u/s  Chart review indicates hx dementia, HTN, DM  Patient presents to the ED via EMS from her local SNF due to abnormal outpatient RUQ ultrasound.  I reviewed the ultrasound results from RUQ study performed yesterday.  No gallstones are noted, no gallbladder wall thickness, but her CBD is prominent at 10.3 mm.  Due to this, she was sent to the ED.  Here in the ED, patient reports no symptoms and says she feels fine.  Does not know why she was sent here.  Does report epigastric pain and RUQ pain to my palpation on examination, but has no complaints while we talk. 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 No pertinent surgical history.  Prior to Admission medications   Medication Sig Start Date End Date Taking? Authorizing Provider  feeding supplement, ENSURE ENLIVE, (ENSURE ENLIVE) LIQD Take 237 mLs by mouth 2 (two) times daily between meals. 02/25/20   Enedina Finner, MD  Multiple Vitamin (MULTIVITAMIN WITH MINERALS) TABS tablet Take 1 tablet by mouth daily. 02/26/20   Enedina Finner, MD  olmesartan (BENICAR) 40 MG tablet Take 1 tablet (40 mg total) by mouth daily. 02/25/20 02/24/21  Enedina Finner, MD    Allergies Patient has no known allergies.  Family History  Problem Relation Age of Onset   Heart attack Father     Social History Social History   Tobacco Use   Smoking status: Former   Smokeless tobacco: Never  Substance Use Topics   Alcohol use: Yes    Comment: daily   Drug use: Never    Review of Systems  Unable to be accurately assessed due to patient's disorientation and dementia ____________________________________________   PHYSICAL EXAM:  VITAL SIGNS: Vitals:   08/28/21 1816 08/28/21 2006  BP:  (!) 108/59  Pulse: 63 (!) 51  Resp: 18 20  Temp:  98.8 F (37.1 C)  SpO2: 95% 97%     Constitutional: Alert and pleasantly disoriented. Well appearing and in no acute distress. Eyes: Conjunctivae are normal. PERRL. EOMI. Head: Atraumatic. Nose: No congestion/rhinnorhea. Mouth/Throat: Mucous membranes are moist.  Oropharynx non-erythematous. Neck: No stridor. No cervical spine tenderness to palpation. Cardiovascular: Normal rate, regular rhythm. Grossly normal heart  sounds.  Good peripheral circulation. Respiratory: Normal respiratory effort.  No retractions. Lungs CTAB. Gastrointestinal: Soft , nondistended. No CVA tenderness.Marland Kitchen. Epigastric and RUQ tenderness with some intermittent voluntary guarding with deeper palpations, abdomen is otherwise benign. Musculoskeletal: No lower extremity tenderness nor edema.  No joint effusions. No signs of acute  trauma. Neurologic:  Normal speech and language. No gross focal neurologic deficits are appreciated. No gait instability noted. Skin:  Skin is warm, dry and intact. No rash noted. Psychiatric: Mood and affect are normal. Speech and behavior are normal.  ____________________________________________   LABS (all labs ordered are listed, but only abnormal results are displayed)  Labs Reviewed  COMPREHENSIVE METABOLIC PANEL - Abnormal; Notable for the following components:      Result Value   Sodium 133 (*)    CO2 19 (*)    Glucose, Bld 130 (*)    Total Bilirubin 1.7 (*)    All other components within normal limits  CBC WITH DIFFERENTIAL/PLATELET - Abnormal; Notable for the following components:   RBC 2.90 (*)    Hemoglobin 10.3 (*)    HCT 28.3 (*)    MCH 35.5 (*)    MCHC 36.4 (*)    Platelets 91 (*)    All other components within normal limits  LIPASE, BLOOD - Abnormal; Notable for the following components:   Lipase 53 (*)    All other components within normal limits  RESP PANEL BY RT-PCR (FLU A&B, COVID) ARPGX2  HIV ANTIBODY (ROUTINE TESTING W REFLEX)  COMPREHENSIVE METABOLIC PANEL  CBC   ____________________________________________  12 Lead EKG   ____________________________________________  RADIOLOGY  ED MD interpretation:    Official radiology report(s): MR 3D Recon At Scanner  Result Date: 08/28/2021 CLINICAL DATA:  Epigastric pain, reported outside right upper quadrant ultrasound with dilated CBD EXAM: MRI ABDOMEN WITHOUT AND WITH CONTRAST (INCLUDING MRCP) TECHNIQUE: Multiplanar multisequence MR imaging of the abdomen was performed both before and after the administration of intravenous contrast. Heavily T2-weighted images of the biliary and pancreatic ducts were obtained, and three-dimensional MRCP images were rendered by post processing. CONTRAST:  7.85mL GADAVIST GADOBUTROL 1 MMOL/ML IV SOLN COMPARISON:  CT abdomen pelvis, 05/01/2020 FINDINGS: Examination is  generally limited by breath motion artifact throughout Lower chest: No acute findings. Hepatobiliary: Somewhat coarse, nodular appearance of the liver. No mass or other parenchymal abnormality identified. Mild intra and extrahepatic biliary ductal dilatation, common bile duct measuring up to 1.2 cm. Despite limitations of the examination, suspect a small gallstone near the ampulla measuring 4 mm (series 5, image 21, series 2, image 15). Pancreas: No mass, inflammatory changes, or other parenchymal abnormality identified. No pancreatic ductal dilatation. Spleen:  Within normal limits in size and appearance. Adrenals/Urinary Tract: No masses identified. No evidence of hydronephrosis. Stomach/Bowel: Visualized portions within the abdomen are unremarkable. Vascular/Lymphatic: No pathologically enlarged lymph nodes identified. No abdominal aortic aneurysm demonstrated. Other:  None. Musculoskeletal: No suspicious bone lesions identified. IMPRESSION: 1. Examination is generally limited by breath motion artifact throughout. There is mild intra and extrahepatic biliary ductal dilatation, common bile duct measuring up to 1.2 cm. Despite limitations of the examination, suspect a small gallstone near the ampulla measuring approximately 4 mm. 2. Somewhat coarse, nodular appearance of the liver, suggestive of cirrhosis. Electronically Signed   By: Lauralyn PrimesAlex  Bibbey M.D.   On: 08/28/2021 19:41   MR ABDOMEN MRCP W WO CONTAST  Result Date: 08/28/2021 CLINICAL DATA:  Epigastric pain, reported outside right upper quadrant ultrasound with dilated CBD EXAM: MRI  ABDOMEN WITHOUT AND WITH CONTRAST (INCLUDING MRCP) TECHNIQUE: Multiplanar multisequence MR imaging of the abdomen was performed both before and after the administration of intravenous contrast. Heavily T2-weighted images of the biliary and pancreatic ducts were obtained, and three-dimensional MRCP images were rendered by post processing. CONTRAST:  7.42mL GADAVIST GADOBUTROL 1  MMOL/ML IV SOLN COMPARISON:  CT abdomen pelvis, 05/01/2020 FINDINGS: Examination is generally limited by breath motion artifact throughout Lower chest: No acute findings. Hepatobiliary: Somewhat coarse, nodular appearance of the liver. No mass or other parenchymal abnormality identified. Mild intra and extrahepatic biliary ductal dilatation, common bile duct measuring up to 1.2 cm. Despite limitations of the examination, suspect a small gallstone near the ampulla measuring 4 mm (series 5, image 21, series 2, image 15). Pancreas: No mass, inflammatory changes, or other parenchymal abnormality identified. No pancreatic ductal dilatation. Spleen:  Within normal limits in size and appearance. Adrenals/Urinary Tract: No masses identified. No evidence of hydronephrosis. Stomach/Bowel: Visualized portions within the abdomen are unremarkable. Vascular/Lymphatic: No pathologically enlarged lymph nodes identified. No abdominal aortic aneurysm demonstrated. Other:  None. Musculoskeletal: No suspicious bone lesions identified. IMPRESSION: 1. Examination is generally limited by breath motion artifact throughout. There is mild intra and extrahepatic biliary ductal dilatation, common bile duct measuring up to 1.2 cm. Despite limitations of the examination, suspect a small gallstone near the ampulla measuring approximately 4 mm. 2. Somewhat coarse, nodular appearance of the liver, suggestive of cirrhosis. Electronically Signed   By: Lauralyn Primes M.D.   On: 08/28/2021 19:41    ____________________________________________   PROCEDURES and INTERVENTIONS  Procedure(s) performed (including Critical Care):  Procedures  Medications  piperacillin-tazobactam (ZOSYN) IVPB 3.375 g (3.375 g Intravenous New Bag/Given 08/28/21 2021)  irbesartan (AVAPRO) tablet 37.5 mg (has no administration in time Holthaus)  multivitamin with minerals tablet 1 tablet (has no administration in time Braniff)  enoxaparin (LOVENOX) injection 40 mg (has no  administration in time Guion)  0.9 %  sodium chloride infusion (has no administration in time Quinn)  acetaminophen (TYLENOL) tablet 650 mg (has no administration in time Sol)    Or  acetaminophen (TYLENOL) suppository 650 mg (has no administration in time Schriever)  traZODone (DESYREL) tablet 25 mg (has no administration in time Rosko)  ondansetron (ZOFRAN) tablet 4 mg (has no administration in time Disano)    Or  ondansetron (ZOFRAN) injection 4 mg (has no administration in time Cousins)  morphine 2 MG/ML injection 1-2 mg (has no administration in time Burdo)  LORazepam (ATIVAN) tablet 1 mg (1 mg Oral Given 08/28/21 1837)  gadobutrol (GADAVIST) 1 MMOL/ML injection 7.5 mL (7.5 mLs Intravenous Contrast Given 08/28/21 1926)    ____________________________________________   MDM / ED COURSE   70 year old female presents to the ED with abnormal outpatient ultrasound, found evidence of choledocholithiasis, requiring medical admission for ERCP.  She looks clinically well, is pleasantly disoriented and has no complaints, but is noted to have some epigastric and RUQ tenderness to palpation on examination.  Abdomen is otherwise benign.  Blood work with minimal elevation of her bilirubin and lipase, but no evidence of transaminitis.  MRCP obtained demonstrates distal choledocholithiasis.  I discussed with GI, who recommends medical admission with prophylactic antibiotics and plans for ERCP in the morning.   Clinical Course as of 08/28/21 2045  Mon Aug 28, 2021  1520 We discussed my recommendation for MRCP.  She is in agreement.  She was report some anxiety with going "into the tube."  We discussed anxiolysis and my recommendations for exam.  She is in agreement. [DS]  1954 MRCP reviewed and GI paged [DS]  1958 Updated patient of this.  Daughters at the bedside.  We are in agreement. [DS]  2001 I discuss with Dr. Mia Creek, he recommends medical admission for planned ERCP, likely tomorrow.  Recommends  empiric antibiotics. [DS]    Clinical Course User Index [DS] Delton Prairie, MD    ____________________________________________   FINAL CLINICAL IMPRESSION(S) / ED DIAGNOSES  Final diagnoses:  Choledocholithiasis     ED Discharge Orders     None        Joniya Boberg Katrinka Blazing   Note:  This document was prepared using Dragon voice recognition software and may include unintentional dictation errors.    Delton Prairie, MD 08/28/21 2046

## 2021-08-29 ENCOUNTER — Inpatient Hospital Stay: Payer: Medicare Other

## 2021-08-29 ENCOUNTER — Inpatient Hospital Stay: Payer: Medicare Other | Admitting: Anesthesiology

## 2021-08-29 ENCOUNTER — Encounter
Admission: EM | Disposition: A | Discharge status: Skilled Nursing Facility | Payer: Self-pay | Source: Skilled Nursing Facility | Attending: Hospitalist

## 2021-08-29 ENCOUNTER — Encounter: Payer: Self-pay | Admitting: Family Medicine

## 2021-08-29 DIAGNOSIS — K8050 Calculus of bile duct without cholangitis or cholecystitis without obstruction: Principal | ICD-10-CM

## 2021-08-29 DIAGNOSIS — R1011 Right upper quadrant pain: Secondary | ICD-10-CM | POA: Insufficient documentation

## 2021-08-29 HISTORY — PX: ERCP: SHX5425

## 2021-08-29 LAB — COMPREHENSIVE METABOLIC PANEL
ALT: 18 U/L (ref 0–44)
AST: 31 U/L (ref 15–41)
Albumin: 3.7 g/dL (ref 3.5–5.0)
Alkaline Phosphatase: 78 U/L (ref 38–126)
Anion gap: 5 (ref 5–15)
BUN: 14 mg/dL (ref 8–23)
CO2: 20 mmol/L — ABNORMAL LOW (ref 22–32)
Calcium: 9.3 mg/dL (ref 8.9–10.3)
Chloride: 111 mmol/L (ref 98–111)
Creatinine, Ser: 0.85 mg/dL (ref 0.44–1.00)
GFR, Estimated: >60 mL/min (ref >60)
Glucose, Bld: 94 mg/dL (ref 70–99)
Potassium: 4.2 mmol/L (ref 3.5–5.1)
Sodium: 136 mmol/L (ref 135–145)
Total Bilirubin: 2.2 mg/dL — ABNORMAL HIGH (ref 0.3–1.2)
Total Protein: 6.9 g/dL (ref 6.5–8.1)

## 2021-08-29 LAB — CBG MONITORING, ED
Glucose-Capillary: 81 mg/dL (ref 70–99)
Glucose-Capillary: 116 mg/dL — ABNORMAL HIGH (ref 70–99)
Glucose-Capillary: 107 mg/dL — ABNORMAL HIGH (ref 70–99)

## 2021-08-29 LAB — CBC
HCT: 30.3 % — ABNORMAL LOW (ref 36.0–46.0)
Hemoglobin: 10.8 g/dL — ABNORMAL LOW (ref 12.0–15.0)
MCH: 34.4 pg — ABNORMAL HIGH (ref 26.0–34.0)
MCHC: 35.6 g/dL (ref 30.0–36.0)
MCV: 96.5 fL (ref 80.0–100.0)
Platelets: 99 10*3/uL — ABNORMAL LOW (ref 150–400)
RBC: 3.14 MIL/uL — ABNORMAL LOW (ref 3.87–5.11)
RDW: 12.6 % (ref 11.5–15.5)
WBC: 5.3 10*3/uL (ref 4.0–10.5)
nRBC: 0 % (ref 0.0–0.2)

## 2021-08-29 LAB — HEMOGLOBIN A1C
Hgb A1c MFr Bld: 5.5 % (ref 4.8–5.6)
Mean Plasma Glucose: 111.15 mg/dL

## 2021-08-29 LAB — GLUCOSE, CAPILLARY: Glucose-Capillary: 169 mg/dL — ABNORMAL HIGH (ref 70–99)

## 2021-08-29 LAB — HIV ANTIBODY (ROUTINE TESTING W REFLEX): HIV Screen 4th Generation wRfx: NONREACTIVE

## 2021-08-29 LAB — PROTIME-INR
INR: 1.2 (ref 0.8–1.2)
Prothrombin Time: 15.4 seconds — ABNORMAL HIGH (ref 11.4–15.2)

## 2021-08-29 SURGERY — ERCP, WITH INTERVENTION IF INDICATED
Anesthesia: General

## 2021-08-29 MED ORDER — FENTANYL CITRATE PF 50 MCG/ML IJ SOSY: 25.0000 ug | PREFILLED_SYRINGE | INTRAMUSCULAR | Status: DC | PRN

## 2021-08-29 MED ORDER — PROPOFOL 500 MG/50ML IV EMUL
INTRAVENOUS | Status: DC | PRN
  Administered 2021-08-29: 145 ug/kg/min via INTRAVENOUS

## 2021-08-29 MED ORDER — FENTANYL CITRATE (PF) 100 MCG/2ML IJ SOLN
INTRAMUSCULAR | Status: AC
  Filled 2021-08-29: qty 2

## 2021-08-29 MED ORDER — INDOMETHACIN 50 MG RE SUPP
RECTAL | Status: AC
  Filled 2021-08-29: qty 2

## 2021-08-29 MED ORDER — ONDANSETRON HCL 4 MG/2ML IJ SOLN
INTRAMUSCULAR | Status: DC | PRN
  Administered 2021-08-29: 4 mg via INTRAVENOUS

## 2021-08-29 MED ORDER — PROPOFOL 10 MG/ML IV BOLUS
INTRAVENOUS | Status: AC
  Filled 2021-08-29: qty 20

## 2021-08-29 MED ORDER — ROCURONIUM BROMIDE 10 MG/ML (PF) SYRINGE
PREFILLED_SYRINGE | INTRAVENOUS | Status: AC
  Filled 2021-08-29: qty 10

## 2021-08-29 MED ORDER — LIDOCAINE HCL (PF) 2 % IJ SOLN
INTRAMUSCULAR | Status: AC
  Filled 2021-08-29: qty 5

## 2021-08-29 MED ORDER — PROPOFOL 10 MG/ML IV BOLUS
INTRAVENOUS | Status: DC | PRN
  Administered 2021-08-29 (×2): 10 mg via INTRAVENOUS
  Administered 2021-08-29: 40 mg via INTRAVENOUS
  Administered 2021-08-29: 10 mg via INTRAVENOUS

## 2021-08-29 MED ORDER — ONDANSETRON HCL 4 MG/2ML IJ SOLN: 4.0000 mg | Freq: Once | INTRAMUSCULAR | Status: DC | PRN

## 2021-08-29 MED ORDER — INDOMETHACIN 50 MG RE SUPP
RECTAL | Status: AC
  Administered 2021-08-29: 50 mg
  Filled 2021-08-29: qty 2

## 2021-08-29 MED ORDER — DEXAMETHASONE SODIUM PHOSPHATE 10 MG/ML IJ SOLN
INTRAMUSCULAR | Status: AC
  Filled 2021-08-29: qty 1

## 2021-08-29 MED ORDER — LIDOCAINE HCL (CARDIAC) PF 100 MG/5ML IV SOSY
PREFILLED_SYRINGE | INTRAVENOUS | Status: DC | PRN
  Administered 2021-08-29: 100 mg via INTRAVENOUS

## 2021-08-29 MED ORDER — INDOMETHACIN 50 MG RE SUPP
100.0000 mg | Freq: Once | RECTAL | Status: AC
  Administered 2021-08-29: 100 mg via RECTAL

## 2021-08-29 MED ORDER — ONDANSETRON HCL 4 MG/2ML IJ SOLN
INTRAMUSCULAR | Status: AC
  Filled 2021-08-29: qty 2

## 2021-08-29 MED ORDER — GLYCOPYRROLATE 0.2 MG/ML IJ SOLN
INTRAMUSCULAR | Status: DC | PRN
  Administered 2021-08-29: .2 mg via INTRAVENOUS

## 2021-08-29 MED ORDER — SUCCINYLCHOLINE CHLORIDE 200 MG/10ML IV SOSY
PREFILLED_SYRINGE | INTRAVENOUS | Status: AC
  Filled 2021-08-29: qty 10

## 2021-08-29 NOTE — ED Notes (Signed)
Michelle RN aware of assigned bed 

## 2021-08-29 NOTE — Progress Notes (Signed)
PROGRESS NOTE    Sheila Bowman  ZOX:[id number] DOB: 10/23/1951 DOA: 08/28/2021 PCP: Toya Smothers, MD   Brief Narrative: Taken from H&P. Sheila Bowman is a 70 y.o. female with medical history significant for type 2 diabetes mellitus, liver cirrhosis, dementia and hypertension, who presented to the emergency room after having an abnormal right upper quadrant ultrasound with common bile duct dilatation of 10.3 mm and suspected choledocholithiasis.  The patient has apparently been having mild epigastric and right upper quadrant abdominal pain and tenderness discovered on physical exam, without nausea and vomiting at her facility. MRCP with concern of dilated extrahepatic biliary duct and a possible gallstone near the ampulla.  Coarse nodular liver suggestive of cirrhosis. GI was consulted and patient is going for ERCP later today.  Subjective: Patient was seen and examined today.  Waiting for her procedure.  Denies any pain, nausea or vomiting.  Daughter at bedside.  Assessment & Plan:   Active Problems:   Choledocholithiasis  Choledocholithiasis.  MRCP concerning for choledocholithiasis with bile duct dilatation.  Going for ERCP later today. -Follow-up GI recommendations. -Might need elective cholecystectomy as an outpatient.  History of liver cirrhosis.  Seems stable. -Continue to monitor -Continue home dose of lactulose  Mild hyponatremia and thrombocytopenia.  Most likely due to liver disease. -Continue to monitor  Essential hypertension. -Continue home meds  Type 2 diabetes mellitus.  Diet controlled at home. -Monitor with SSI as needed  Depression and anxiety. -Continue home BuSpar, Ativan and Zoloft  Objective: Vitals:   08/29/21 0800 08/29/21 1000 08/29/21 1313 08/29/21 1315  BP: (!) 101/46 106/78  (!) 110/50  Pulse: 71 73 (!) 59 69  Resp:  20 20 20   Temp:      TempSrc:      SpO2: 97% 100% 95% 94%    Intake/Output Summary (Last 24 hours) at  08/29/2021 1348 Last data filed at 08/28/2021 2226 Gross per 24 hour  Intake --  Output 400 ml  Net -400 ml   There were no vitals filed for this visit.  Examination:  General exam: Appears calm and comfortable  Respiratory system: Clear to auscultation. Respiratory effort normal. Cardiovascular system: S1 & S2 heard, RRR.  Gastrointestinal system: Soft, nontender, nondistended, bowel sounds positive. Central nervous system: Alert and oriented. No focal neurological deficits. Extremities: No edema, no cyanosis, pulses intact and symmetrical. Psychiatry: Judgement and insight appear normal. Mood & affect appropriate.    DVT prophylaxis: SCDs Code Status: DNR Family Communication: Discussed with daughter at bedside Disposition Plan:  Status is: Inpatient  Remains inpatient appropriate because:Inpatient level of care appropriate due to severity of illness  Dispo: The patient is from: Home              Anticipated d/c is to: Home              Patient currently is not medically stable to d/c.   Difficult to place patient No              Level of care: Med-Surg  All the records are reviewed and case discussed with Care Management/Social Worker. Management plans discussed with the patient, nursing and they are in agreement.  Consultants:  Gastroenterology  Procedures:  Antimicrobials:   Data Reviewed: I have personally reviewed following labs and imaging studies  CBC: Recent Labs  Lab 08/28/21 1530 08/29/21 0438  WBC 5.2 5.3  NEUTROABS 2.9  --   HGB 10.3* 10.8*  HCT 28.3* 30.3*  MCV 97.6 96.5  PLT  91* 99*   Basic Metabolic Panel: Recent Labs  Lab 08/28/21 1530 08/29/21 0438  NA 133* 136  K 4.4 4.2  CL 108 111  CO2 19* 20*  GLUCOSE 130* 94  BUN 16 14  CREATININE 0.99 0.85  CALCIUM 9.1 9.3   GFR: CrCl cannot be calculated (Unknown ideal weight.). Liver Function Tests: Recent Labs  Lab 08/28/21 1530 08/29/21 0438  AST 38 31  ALT 15 18  ALKPHOS 80 78   BILITOT 1.7* 2.2*  PROT 6.6 6.9  ALBUMIN 3.5 3.7   Recent Labs  Lab 08/28/21 1530  LIPASE 53*   No results for input(s): AMMONIA in the last 168 hours. Coagulation Profile: Recent Labs  Lab 08/29/21 0744  INR 1.2   Cardiac Enzymes: No results for input(s): CKTOTAL, CKMB, CKMBINDEX, TROPONINI in the last 168 hours. BNP (last 3 results) No results for input(s): PROBNP in the last 8760 hours. HbA1C: Recent Labs    08/28/21 1530  HGBA1C 5.5   CBG: Recent Labs  Lab 08/28/21 2232 08/29/21 0741 08/29/21 1243  GLUCAP 138* 81 116*   Lipid Profile: No results for input(s): CHOL, HDL, LDLCALC, TRIG, CHOLHDL, LDLDIRECT in the last 72 hours. Thyroid Function Tests: No results for input(s): TSH, T4TOTAL, FREET4, T3FREE, THYROIDAB in the last 72 hours. Anemia Panel: No results for input(s): VITAMINB12, FOLATE, FERRITIN, TIBC, IRON, RETICCTPCT in the last 72 hours. Sepsis Labs: No results for input(s): PROCALCITON, LATICACIDVEN in the last 168 hours.  Recent Results (from the past 240 hour(s))  Resp Panel by RT-PCR (Flu A&B, Covid) Nasopharyngeal Swab     Status: None   Collection Time: 08/28/21  3:30 PM   Specimen: Nasopharyngeal Swab; Nasopharyngeal(NP) swabs in vial transport medium  Result Value Ref Kartes Status   SARS Coronavirus 2 by RT PCR NEGATIVE NEGATIVE Final    Comment: (NOTE) SARS-CoV-2 target nucleic acids are NOT DETECTED.  The SARS-CoV-2 RNA is generally detectable in upper respiratory specimens during the acute phase of infection. The lowest concentration of SARS-CoV-2 viral copies this assay can detect is 138 copies/mL. A negative result does not preclude SARS-Cov-2 infection and should not be used as the sole basis for treatment or other patient management decisions. A negative result may occur with  improper specimen collection/handling, submission of specimen other than nasopharyngeal swab, presence of viral mutation(s) within the areas targeted by  this assay, and inadequate number of viral copies(<138 copies/mL). A negative result must be combined with clinical observations, patient history, and epidemiological information. The expected result is Negative.  Fact Sheet for Patients:  BloggerCourse.comhttps://www.fda.gov/media/152166/download  Fact Sheet for Healthcare Providers:  SeriousBroker.ithttps://www.fda.gov/media/152162/download  This test is no t yet approved or cleared by the [place] FDA and  has been authorized for detection and/or diagnosis of SARS-CoV-2 by FDA under an Emergency Use Authorization (EUA). This EUA will remain  in effect (meaning this test can be used) for the duration of the COVID-19 declaration under Section 564(b)(1) of the Act, 21 U.S.C.section 360bbb-3(b)(1), unless the authorization is terminated  or revoked sooner.       Influenza A by PCR NEGATIVE NEGATIVE Final   Influenza B by PCR NEGATIVE NEGATIVE Final    Comment: (NOTE) The Xpert Xpress SARS-CoV-2/FLU/RSV plus assay is intended as an aid in the diagnosis of influenza from Nasopharyngeal swab specimens and should not be used as a sole basis for treatment. Nasal washings and aspirates are unacceptable for Xpert Xpress SARS-CoV-2/FLU/RSV testing.  Fact Sheet for Patients: BloggerCourse.comhttps://www.fda.gov/media/152166/download  Fact Sheet  for Healthcare Providers: SeriousBroker.it  This test is not yet approved or cleared by the [place] and has been authorized for detection and/or diagnosis of SARS-CoV-2 by FDA under an Emergency Use Authorization (EUA). This EUA will remain in effect (meaning this test can be used) for the duration of the COVID-19 declaration under Section 564(b)(1) of the Act, 21 U.S.C. section 360bbb-3(b)(1), unless the authorization is terminated or revoked.  Performed at [facility], [address], [place], [place] [address]      Radiology Studies: MR 3D Recon At Scanner  Result Date:  08/28/2021 CLINICAL DATA:  Epigastric pain, reported outside right upper quadrant ultrasound with dilated CBD EXAM: MRI ABDOMEN WITHOUT AND WITH CONTRAST (INCLUDING MRCP) TECHNIQUE: Multiplanar multisequence MR imaging of the abdomen was performed both before and after the administration of intravenous contrast. Heavily T2-weighted images of the biliary and pancreatic ducts were obtained, and three-dimensional MRCP images were rendered by post processing. CONTRAST:  7.61mL GADAVIST GADOBUTROL 1 MMOL/ML IV SOLN COMPARISON:  CT abdomen pelvis, 05/01/2020 FINDINGS: Examination is generally limited by breath motion artifact throughout Lower chest: No acute findings. Hepatobiliary: Somewhat coarse, nodular appearance of the liver. No mass or other parenchymal abnormality identified. Mild intra and extrahepatic biliary ductal dilatation, common bile duct measuring up to 1.2 cm. Despite limitations of the examination, suspect a small gallstone near the ampulla measuring 4 mm (series 5, image 21, series 2, image 15). Pancreas: No mass, inflammatory changes, or other parenchymal abnormality identified. No pancreatic ductal dilatation. Spleen:  Within normal limits in size and appearance. Adrenals/Urinary Tract: No masses identified. No evidence of hydronephrosis. Stomach/Bowel: Visualized portions within the abdomen are unremarkable. Vascular/Lymphatic: No pathologically enlarged lymph nodes identified. No abdominal aortic aneurysm demonstrated. Other:  None. Musculoskeletal: No suspicious bone lesions identified. IMPRESSION: 1. Examination is generally limited by breath motion artifact throughout. There is mild intra and extrahepatic biliary ductal dilatation, common bile duct measuring up to 1.2 cm. Despite limitations of the examination, suspect a small gallstone near the ampulla measuring approximately 4 mm. 2. Somewhat coarse, nodular appearance of the liver, suggestive of cirrhosis. Electronically Signed   By: Lauralyn Primes M.D.   On: 08/28/2021 19:41   MR ABDOMEN MRCP W WO CONTAST  Result Date: 08/28/2021 CLINICAL DATA:  Epigastric pain, reported outside right upper quadrant ultrasound with dilated CBD EXAM: MRI ABDOMEN WITHOUT AND WITH CONTRAST (INCLUDING MRCP) TECHNIQUE: Multiplanar multisequence MR imaging of the abdomen was performed both before and after the administration of intravenous contrast. Heavily T2-weighted images of the biliary and pancreatic ducts were obtained, and three-dimensional MRCP images were rendered by post processing. CONTRAST:  7.10mL GADAVIST GADOBUTROL 1 MMOL/ML IV SOLN COMPARISON:  CT abdomen pelvis, 05/01/2020 FINDINGS: Examination is generally limited by breath motion artifact throughout Lower chest: No acute findings. Hepatobiliary: Somewhat coarse, nodular appearance of the liver. No mass or other parenchymal abnormality identified. Mild intra and extrahepatic biliary ductal dilatation, common bile duct measuring up to 1.2 cm. Despite limitations of the examination, suspect a small gallstone near the ampulla measuring 4 mm (series 5, image 21, series 2, image 15). Pancreas: No mass, inflammatory changes, or other parenchymal abnormality identified. No pancreatic ductal dilatation. Spleen:  Within normal limits in size and appearance. Adrenals/Urinary Tract: No masses identified. No evidence of hydronephrosis. Stomach/Bowel: Visualized portions within the abdomen are unremarkable. Vascular/Lymphatic: No pathologically enlarged lymph nodes identified. No abdominal aortic aneurysm demonstrated. Other:  None. Musculoskeletal: No suspicious bone lesions identified. IMPRESSION: 1. Examination is  generally limited by breath motion artifact throughout. There is mild intra and extrahepatic biliary ductal dilatation, common bile duct measuring up to 1.2 cm. Despite limitations of the examination, suspect a small gallstone near the ampulla measuring approximately 4 mm. 2. Somewhat coarse, nodular  appearance of the liver, suggestive of cirrhosis. Electronically Signed   By: Lauralyn Primes M.D.   On: 08/28/2021 19:41    Scheduled Meds:  amLODipine  5 mg Oral Daily   busPIRone  5 mg Oral BID   ferrous sulfate  325 mg Oral Q breakfast   folic acid  1 mg Oral Daily   insulin aspart  0-9 Units Subcutaneous TID AC & HS   irbesartan  37.5 mg Oral Daily   loratadine  10 mg Oral Daily   multivitamin with minerals  1 tablet Oral Daily   progesterone  200 mg Oral QHS   sertraline  25 mg Oral Daily   spironolactone  25 mg Oral Daily   thiamine  100 mg Oral Daily   Continuous Infusions:  sodium chloride 100 mL/hr at 08/29/21 0319     LOS: 1 day   Time spent: 37 minutes. More than 50% of the time was spent in counseling/coordination of care  Arnetha Courser, MD [facility]  If 7PM-7AM, please contact night-coverage [email or web address]  08/29/2021, 1:48 PM   This record has been created using Conservation officer, historic buildings. Errors have been sought and corrected,but may not always be located. Such creation errors do not reflect on the standard of care.

## 2021-08-29 NOTE — ED Notes (Signed)
Pt sleeping. 

## 2021-08-29 NOTE — Anesthesia Procedure Notes (Signed)
Procedure Name: General with mask airway Date/Time: 08/29/2021 4:33 PM Performed by: Mohammed Kindle, CRNA Pre-anesthesia Checklist: Patient identified, Emergency Drugs available, Suction available and Patient being monitored Patient Re-evaluated:Patient Re-evaluated prior to induction Oxygen Delivery Method: Simple face mask Induction Type: IV induction Placement Confirmation: positive ETCO2 and CO2 detector Dental Injury: Teeth and Oropharynx as per pre-operative assessment

## 2021-08-29 NOTE — Transfer of Care (Signed)
Immediate Anesthesia Transfer of Care Note  Patient: Sheila Bowman  Procedure(s) Performed: ENDOSCOPIC RETROGRADE CHOLANGIOPANCREATOGRAPHY (ERCP)  Patient Location: Endoscopy Unit  Anesthesia Type:General  Level of Consciousness: awake, drowsy and patient cooperative  Airway & Oxygen Therapy: Patient Spontanous Breathing and Patient connected to face mask oxygen  Post-op Assessment: Report given to RN and Post -op Vital signs reviewed and stable  Post vital signs: Reviewed and stable  Last Vitals:  Vitals Value Taken Time  BP 123/69 08/29/21 1654  Temp 36.7 C 08/29/21 1653  Pulse 94 08/29/21 1658  Resp 18 08/29/21 1658  SpO2 96 % 08/29/21 1658  Vitals shown include unvalidated device data.  Last Pain:  Vitals:   08/29/21 1653  TempSrc: Temporal  PainSc: (P) 0-No pain         Complications: No notable events documented.

## 2021-08-29 NOTE — Care Plan (Signed)
Spoke with both patient who was oriented at time of my evaluation and also spoke with her daughter [name] who is POA to explain benefits and risks of ERCP. Both verbalized understanding and agreed with proceeding with procedure. Full consult note pending.  Merlyn Lot MD, MPH [facility]

## 2021-08-29 NOTE — Consult Note (Signed)
Consultation  Referring Provider:   ED Admit date: 8/29 Consult date: 8/29        Reason for Consultation:   Choledocholithiasis         HPI:   Sheila Bowman is a 70 y.o. lady with history of alcohol cirrhosis manifested with thrombocytopenia. Her last drink was several years ago. She has a history of dementia/wernike's encephalopathy but on my interview patient was very lucid. Daughter confirmed the past couple of days she has been very lucid. She does stay in a memory care unit but can perform most of her ADL's. She presented here with epigastric pain and dilated CBD to 1.1 cm. An MRCP showed distal CBD stone. Patient currently without abdominal pain, fevers, or chills. Only significant liver enzyme elevation is T. Bili of 2.  Past Medical History:  Diagnosis Date   Diabetes mellitus without complication (HCC)    Hypertension     History reviewed. No pertinent surgical history.  Family History  Problem Relation Age of Onset   Heart attack Father     Social History   Tobacco Use   Smoking status: Former   Smokeless tobacco: Never  Substance Use Topics   Alcohol use: Yes    Comment: daily   Drug use: Never    Prior to Admission medications   Medication Sig Start Date End Date Taking? Authorizing Provider  amLODipine (NORVASC) 5 MG tablet Take 5 mg by mouth daily. 08/07/21  Yes [provider]  busPIRone (BUSPAR) 5 MG tablet Take 5 mg by mouth 2 (two) times daily. 08/07/21  Yes [provider]  feeding supplement, ENSURE ENLIVE, (ENSURE ENLIVE) LIQD Take 237 mLs by mouth 2 (two) times daily between meals. 02/25/20  Yes Enedina FinnerPatel, Sona, MD  ferrous sulfate 325 (65 FE) MG tablet Take 325 mg by mouth daily with breakfast.   Yes [provider]  folic acid (FOLVITE) 1 MG tablet Take 1 mg by mouth daily.   Yes [provider]  lactulose (CHRONULAC) 10 GM/15ML solution Take 30 mLs by mouth in the morning and at bedtime. 08/19/21  Yes [provider]  loratadine (CLARITIN) 10 MG tablet Take 10 mg by mouth daily.   Yes [provider]  Multiple Vitamin (MULTIVITAMIN WITH MINERALS) TABS tablet Take 1 tablet by mouth daily. 02/26/20  Yes Enedina FinnerPatel, Sona, MD  progesterone (PROMETRIUM) 200 MG capsule Take 200 mg by mouth at bedtime. 08/07/21  Yes [provider]  sertraline (ZOLOFT) 25 MG tablet Take 25 mg by mouth daily. 08/07/21  Yes [provider]  spironolactone (ALDACTONE) 25 MG tablet Take 25 mg by mouth daily. 08/07/21  Yes [provider]  thiamine 100 MG tablet Take 100 mg by mouth daily.   Yes [provider]  olmesartan (BENICAR) 40 MG tablet Take 1 tablet (40 mg total) by mouth daily. 02/25/20 02/24/21  Enedina FinnerPatel, Sona, MD    Current Facility-Administered Medications  Medication Dose Route Frequency Provider Last Rate Last Admin   0.9 %  sodium chloride infusion   Intravenous Continuous Mansy, Jan A, MD 100 mL/hr at 08/29/21 0319 New Bag at 08/29/21 0319   acetaminophen (TYLENOL) tablet 650 mg  650 mg Oral Q6H PRN Mansy, Jan A, MD       Or   acetaminophen (TYLENOL) suppository 650 mg  650 mg Rectal Q6H PRN Mansy, Jan A, MD       amLODipine (NORVASC) tablet 5 mg  5 mg Oral Daily Mansy, Vernetta HoneyJan A, MD  5 mg at 08/29/21 0935   busPIRone (BUSPAR) tablet 5 mg  5 mg Oral BID Mansy, Jan A, MD   5 mg at 08/29/21 0935   ferrous sulfate tablet 325 mg  325 mg Oral Q breakfast Mansy, Jan A, MD   325 mg at 08/29/21 0935   folic acid (FOLVITE) tablet 1 mg  1 mg Oral Daily Mansy, Jan A, MD   1 mg at 08/29/21 0935   insulin aspart (novoLOG) injection 0-9 Units  0-9 Units Subcutaneous TID [facility] Mansy, Jan A, MD   1 Units at 08/28/21 2242   irbesartan (AVAPRO) tablet 37.5 mg  37.5 mg Oral Daily Mansy, Jan A, MD   37.5 mg at 08/29/21 0936   lactulose (CHRONULAC) 10 GM/15ML solution 20 g  20 g Oral BID PRN Mansy, Jan A, MD       loratadine (CLARITIN) tablet 10 mg  10 mg Oral Daily Mansy, Jan A, MD   10 mg at  08/29/21 0935   morphine 2 MG/ML injection 1-2 mg  1-2 mg Intravenous Q4H PRN Mansy, Jan A, MD       multivitamin with minerals tablet 1 tablet  1 tablet Oral Daily Mansy, Jan A, MD   1 tablet at 08/29/21 0935   ondansetron [facility]) tablet 4 mg  4 mg Oral Q6H PRN Mansy, Jan A, MD       Or   ondansetron [facility]) injection 4 mg  4 mg Intravenous Q6H PRN Mansy, Jan A, MD       progesterone (PROMETRIUM) capsule 200 mg  200 mg Oral QHS Mansy, Jan A, MD       sertraline (ZOLOFT) tablet 25 mg  25 mg Oral Daily Mansy, Jan A, MD   25 mg at 08/29/21 3086   spironolactone (ALDACTONE) tablet 25 mg  25 mg Oral Daily Mansy, Jan A, MD   25 mg at 08/29/21 0935   thiamine tablet 100 mg  100 mg Oral Daily Mansy, Jan A, MD   100 mg at 08/29/21 5784   traZODone (DESYREL) tablet 25 mg  25 mg Oral QHS PRN Mansy, Vernetta Honey, MD       Current Outpatient Medications  Medication Sig Dispense Refill   amLODipine (NORVASC) 5 MG tablet Take 5 mg by mouth daily.     busPIRone (BUSPAR) 5 MG tablet Take 5 mg by mouth 2 (two) times daily.     feeding supplement, ENSURE ENLIVE, (ENSURE ENLIVE) LIQD Take 237 mLs by mouth 2 (two) times daily between meals. 237 mL 12   ferrous sulfate 325 (65 FE) MG tablet Take 325 mg by mouth daily with breakfast.     folic acid (FOLVITE) 1 MG tablet Take 1 mg by mouth daily.     lactulose (CHRONULAC) 10 GM/15ML solution Take 30 mLs by mouth in the morning and at bedtime.     loratadine (CLARITIN) 10 MG tablet Take 10 mg by mouth daily.     Multiple Vitamin (MULTIVITAMIN WITH MINERALS) TABS tablet Take 1 tablet by mouth daily. 30 tablet 0   progesterone (PROMETRIUM) 200 MG capsule Take 200 mg by mouth at bedtime.     sertraline (ZOLOFT) 25 MG tablet Take 25 mg by mouth daily.     spironolactone (ALDACTONE) 25 MG tablet Take 25 mg by mouth daily.     thiamine 100 MG tablet Take 100 mg by mouth daily.     olmesartan (BENICAR) 40 MG tablet Take 1 tablet (40 mg total)  by mouth daily. 30 tablet 1     Allergies as of 08/28/2021   (No Known Allergies)     Review of Systems:    All systems reviewed and negative except where noted in HPI.  Review of Systems  Constitutional:  Negative for chills and fever.  Respiratory:  Negative for shortness of breath.   Cardiovascular:  Negative for chest pain.  Gastrointestinal:  Negative for abdominal pain, blood in stool, constipation, diarrhea, melena and nausea.  Skin:  Negative for itching and rash.  Psychiatric/Behavioral:  Negative for substance abuse.   All other systems reviewed and are negative.    Physical Exam:  Vital signs in last 24 hours: Temp:  [98.6 F (37 C)-98.8 F (37.1 C)] 98.8 F (37.1 C) (08/29 2006) Pulse Rate:  [51-99] 69 (08/30 1315) Resp:  [15-20] 20 (08/30 1315) BP: (101-125)/(42-97) 110/50 (08/30 1315) SpO2:  [94 %-100 %] 94 % (08/30 1315)   General:   Pleasant in NAD Head:  Normocephalic and atraumatic. Eyes:   No icterus.   Conjunctiva pink. Mouth: Mucosa pink moist, no lesions. Neck:  Supple; no masses felt Lungs:  No respiratory distress Heart:  RRR Abdomen:   non-tender, non-distended Msk:  MAEW x4, No clubbing or cyanosis Neurologic:  Alert and  oriented x2;  No focal deficits Skin:  Warm, dry, pink without significant lesions or rashes. Psych:  Alert and cooperative. Normal affect.  LAB RESULTS: Recent Labs    08/28/21 1530 08/29/21 0438  WBC 5.2 5.3  HGB 10.3* 10.8*  HCT 28.3* 30.3*  PLT 91* 99*   BMET Recent Labs    08/28/21 1530 08/29/21 0438  NA 133* 136  K 4.4 4.2  CL 108 111  CO2 19* 20*  GLUCOSE 130* 94  BUN 16 14  CREATININE 0.99 0.85  CALCIUM 9.1 9.3   LFT Recent Labs    08/29/21 0438  PROT 6.9  ALBUMIN 3.7  AST 31  ALT 18  ALKPHOS 78  BILITOT 2.2*   PT/INR Recent Labs    08/29/21 0744  LABPROT 15.4*  INR 1.2    STUDIES: MR 3D Recon At Scanner  Result Date: 08/28/2021 CLINICAL DATA:  Epigastric pain, reported outside right upper quadrant  ultrasound with dilated CBD EXAM: MRI ABDOMEN WITHOUT AND WITH CONTRAST (INCLUDING MRCP) TECHNIQUE: Multiplanar multisequence MR imaging of the abdomen was performed both before and after the administration of intravenous contrast. Heavily T2-weighted images of the biliary and pancreatic ducts were obtained, and three-dimensional MRCP images were rendered by post processing. CONTRAST:  7.18mL GADAVIST GADOBUTROL 1 MMOL/ML IV SOLN COMPARISON:  CT abdomen pelvis, 05/01/2020 FINDINGS: Examination is generally limited by breath motion artifact throughout Lower chest: No acute findings. Hepatobiliary: Somewhat coarse, nodular appearance of the liver. No mass or other parenchymal abnormality identified. Mild intra and extrahepatic biliary ductal dilatation, common bile duct measuring up to 1.2 cm. Despite limitations of the examination, suspect a small gallstone near the ampulla measuring 4 mm (series 5, image 21, series 2, image 15). Pancreas: No mass, inflammatory changes, or other parenchymal abnormality identified. No pancreatic ductal dilatation. Spleen:  Within normal limits in size and appearance. Adrenals/Urinary Tract: No masses identified. No evidence of hydronephrosis. Stomach/Bowel: Visualized portions within the abdomen are unremarkable. Vascular/Lymphatic: No pathologically enlarged lymph nodes identified. No abdominal aortic aneurysm demonstrated. Other:  None. Musculoskeletal: No suspicious bone lesions identified. IMPRESSION: 1. Examination is generally limited by breath motion artifact throughout. There is mild intra and extrahepatic biliary ductal dilatation, common  bile duct measuring up to 1.2 cm. Despite limitations of the examination, suspect a small gallstone near the ampulla measuring approximately 4 mm. 2. Somewhat coarse, nodular appearance of the liver, suggestive of cirrhosis. Electronically Signed   By: Lauralyn Primes M.D.   On: 08/28/2021 19:41   MR ABDOMEN MRCP W WO CONTAST  Result Date:  08/28/2021 CLINICAL DATA:  Epigastric pain, reported outside right upper quadrant ultrasound with dilated CBD EXAM: MRI ABDOMEN WITHOUT AND WITH CONTRAST (INCLUDING MRCP) TECHNIQUE: Multiplanar multisequence MR imaging of the abdomen was performed both before and after the administration of intravenous contrast. Heavily T2-weighted images of the biliary and pancreatic ducts were obtained, and three-dimensional MRCP images were rendered by post processing. CONTRAST:  7.57mL GADAVIST GADOBUTROL 1 MMOL/ML IV SOLN COMPARISON:  CT abdomen pelvis, 05/01/2020 FINDINGS: Examination is generally limited by breath motion artifact throughout Lower chest: No acute findings. Hepatobiliary: Somewhat coarse, nodular appearance of the liver. No mass or other parenchymal abnormality identified. Mild intra and extrahepatic biliary ductal dilatation, common bile duct measuring up to 1.2 cm. Despite limitations of the examination, suspect a small gallstone near the ampulla measuring 4 mm (series 5, image 21, series 2, image 15). Pancreas: No mass, inflammatory changes, or other parenchymal abnormality identified. No pancreatic ductal dilatation. Spleen:  Within normal limits in size and appearance. Adrenals/Urinary Tract: No masses identified. No evidence of hydronephrosis. Stomach/Bowel: Visualized portions within the abdomen are unremarkable. Vascular/Lymphatic: No pathologically enlarged lymph nodes identified. No abdominal aortic aneurysm demonstrated. Other:  None. Musculoskeletal: No suspicious bone lesions identified. IMPRESSION: 1. Examination is generally limited by breath motion artifact throughout. There is mild intra and extrahepatic biliary ductal dilatation, common bile duct measuring up to 1.2 cm. Despite limitations of the examination, suspect a small gallstone near the ampulla measuring approximately 4 mm. 2. Somewhat coarse, nodular appearance of the liver, suggestive of cirrhosis. Electronically Signed   By: Lauralyn Primes M.D.   On: 08/28/2021 19:41       Impression / Plan:   70 y/o lady with compensated alcohol cirrhosis here with epigastric pain and found to have choledocholithiasis on MRCP  - NPO - hold any anticoagulation or prophylactic heparin - ERCP pending - will need surgical consult to discuss cholecystectomy - further recs after procedure  Please call with any concerns or questions.  Merlyn Lot MD, MPH [facility]

## 2021-08-29 NOTE — Op Note (Signed)
[facility] Gastroenterology Patient Name: Sheila Bowman Procedure Date: 08/29/2021 3:54 PM MRN: [id number] Account #: [id number] Date of Birth: 10-Aug-1951 Admit Type: Outpatient Age: 70 Room: [facility] ENDO ROOM 4 Gender: Female Note Status: Finalized Procedure:             ERCP Indications:           Common bile duct stone(s) Providers:             Midge Minium MD, MD Medicines:             Propofol per Anesthesia Complications:         No immediate complications. Procedure:             Pre-Anesthesia Assessment:                        - Prior to the procedure, a History and Physical was                         performed, and patient medications and allergies were                         reviewed. The patient's tolerance of previous                         anesthesia was also reviewed. The risks and benefits                         of the procedure and the sedation options and risks                         were discussed with the patient. All questions were                         answered, and informed consent was obtained. Prior                         Anticoagulants: The patient has taken no previous                         anticoagulant or antiplatelet agents. ASA Grade                         Assessment: II - A patient with mild systemic disease.                         After reviewing the risks and benefits, the patient                         was deemed in satisfactory condition to undergo the                         procedure.                        After obtaining informed consent, the scope was passed                         under direct vision. Throughout the procedure, the  patient's blood pressure, pulse, and oxygen                         saturations were monitored continuously. The [employer] D single use duodenoscope was                         introduced through the mouth, and used to  inject                         contrast into and used to inject contrast into the                         bile duct. The ERCP was accomplished without                         difficulty. The patient tolerated the procedure well. Findings:      The scout film was normal. The esophagus was successfully intubated       under direct vision. The scope was advanced to a normal major papilla in       the descending duodenum without detailed examination of the pharynx,       larynx and associated structures, and upper GI tract. The upper GI tract       was grossly normal. The bile duct was deeply cannulated with the       short-nosed traction sphincterotome. Contrast was injected. I personally       interpreted the bile duct images. There was brisk flow of contrast       through the ducts. Image quality was excellent. Contrast extended to the       entire biliary tree. A wire was passed into the biliary tree. A 7 mm       biliary sphincterotomy was made with a traction (standard)       sphincterotome using ERBE electrocautery. There was no       post-sphincterotomy bleeding. The biliary tree was swept with a 15 mm       balloon starting at the bifurcation. Sludge was swept from the duct. Impression:            - A biliary sphincterotomy was performed.                        - The biliary tree was swept and sludge was found. Recommendation:        - Return patient to hospital ward for ongoing care.                        - Clear liquid diet today.                        - Watch for pancreatitis, bleeding, perforation, and                         cholangitis. Procedure Code(s):     --- Professional ---                        [phone], Endoscopic retrograde cholangiopancreatography                         (  ERCP); with removal of calculi/debris from                         biliary/pancreatic duct(s)                        43262, Endoscopic retrograde cholangiopancreatography                          (ERCP); with sphincterotomy/papillotomy                        [phone], Endoscopic catheterization of the biliary                         ductal system, radiological supervision and                         interpretation Diagnosis Code(s):     --- Professional ---                        K80.50, Calculus of bile duct without cholangitis or                         cholecystitis without obstruction CPT copyright 2019 American Medical Association. All rights reserved. The codes documented in this report are preliminary and upon coder review may  be revised to meet current compliance requirements. Midge Minium MD, MD 08/29/2021 4:52:25 PM This report has been signed electronically. Number of Addenda: 0 Note Initiated On: 08/29/2021 3:54 PM Estimated Blood Loss:  Estimated blood loss: none.      [facility]

## 2021-08-29 NOTE — Anesthesia Postprocedure Evaluation (Signed)
Anesthesia Post Note  Patient: Sheila Bowman  Procedure(s) Performed: ENDOSCOPIC RETROGRADE CHOLANGIOPANCREATOGRAPHY (ERCP)  Patient location during evaluation: PACU Anesthesia Type: General Level of consciousness: awake and alert Pain management: pain level controlled Vital Signs Assessment: post-procedure vital signs reviewed and stable Respiratory status: spontaneous breathing, nonlabored ventilation, respiratory function stable and patient connected to nasal cannula oxygen Cardiovascular status: blood pressure returned to baseline and stable Postop Assessment: no apparent nausea or vomiting Anesthetic complications: no   No notable events documented.   Last Vitals:  Vitals:   08/29/21 1743 08/29/21 1753  BP: 126/73 (!) 114/55  Pulse: 73 70  Resp: (!) 29 15  Temp:    SpO2: 99% 99%    Last Pain:  Vitals:   08/29/21 1753  TempSrc:   PainSc: 0-No pain                 Corinda Gubler

## 2021-08-29 NOTE — Anesthesia Preprocedure Evaluation (Addendum)
Anesthesia Evaluation  Patient identified by MRN, date of birth, ID band Patient confused  General Assessment Comment:Patient ate a peanut butter cracker 8 hours ago. Denies any nausea or vomiting, asymptomatic (admitted for lab results)  Patient is pleasant and holds a conversation but seems a little spacey and confused at baseline  Reviewed: Allergy & Precautions, H&P , NPO status , Patient's Chart, lab work & pertinent test results, reviewed documented beta blocker date and time   History of Anesthesia Complications Negative for: history of anesthetic complications  Airway Mallampati: II  TM Distance: >3 FB Neck ROM: full    Dental  (+) Poor Dentition, Missing,    Pulmonary neg pulmonary ROS, neg sleep apnea, neg COPD, Patient abstained from smoking.Not current smoker, former smoker,    Pulmonary exam normal breath sounds clear to auscultation       Cardiovascular Exercise Tolerance: Poor METShypertension, On Medications (-) CAD and (-) Past MI Normal cardiovascular exam(-) dysrhythmias  Rhythm:regular Rate:Normal  TTE 2020: INTERPRETATION  NORMAL LEFT VENTRICULAR SYSTOLIC FUNCTION  NORMAL RIGHT VENTRICULAR SYSTOLIC FUNCTION  NO VALVULAR STENOSIS  ESTIMATED RVSP =     Neuro/Psych PSYCHIATRIC DISORDERS negative neurological ROS  negative psych ROS   GI/Hepatic negative GI ROS, Neg liver ROS, neg GERD  ,  Endo/Other  diabetes, Poorly Controlled, Type 1, Insulin Dependent  Renal/GU negative Renal ROS  negative genitourinary   Musculoskeletal   Abdominal   Peds  Hematology  (+) Blood dyscrasia, anemia ,   Anesthesia Other Findings Past Medical History: No date: Diabetes mellitus without complication (HCC) No date: Hypertension History reviewed. No pertinent surgical history.   Reproductive/Obstetrics negative OB ROS                           Anesthesia Physical Anesthesia  Plan  ASA: 3 and emergent  Anesthesia Plan: General   Post-op Pain Management:    Induction: Intravenous  PONV Risk Score and Plan: 3 and Ondansetron and Dexamethasone  Airway Management Planned: Natural Airway  Additional Equipment: None  Intra-op Plan:   Post-operative Plan:   Informed Consent: I have reviewed the patients History and Physical, chart, labs and discussed the procedure including the risks, benefits and alternatives for the proposed anesthesia with the patient or authorized representative who has indicated his/her understanding and acceptance.   Patient has DNR.  Discussed DNR with patient and Suspend DNR.   Dental Advisory Given  Plan Discussed with: CRNA  Anesthesia Plan Comments: (Discussed risks of anesthesia with patient and daughter at bedside, including possibility of difficulty with spontaneous ventilation under anesthesia necessitating airway intervention, PONV, and rare risks such as cardiac or respiratory or neurological events, and allergic reactions. Patient understands.)      Anesthesia Quick Evaluation

## 2021-08-29 NOTE — Progress Notes (Signed)
Patient arrived to the unit from Endo, vitals taken, patient assisted to the bathroom, & bed alarm set.

## 2021-08-30 ENCOUNTER — Encounter: Payer: Self-pay | Admitting: Gastroenterology

## 2021-08-30 DIAGNOSIS — R1011 Right upper quadrant pain: Secondary | ICD-10-CM

## 2021-08-30 LAB — GLUCOSE, CAPILLARY
Glucose-Capillary: 78 mg/dL (ref 70–99)
Glucose-Capillary: 102 mg/dL — ABNORMAL HIGH (ref 70–99)
Glucose-Capillary: 95 mg/dL (ref 70–99)

## 2021-08-30 NOTE — Plan of Care (Signed)
?  Problem: Clinical Measurements: ?Goal: Will remain free from infection ?Outcome: Progressing ?Goal: Diagnostic test results will improve ?Outcome: Progressing ?  ?Problem: Pain Managment: ?Goal: General experience of comfort will improve ?Outcome: Progressing ?  ?Problem: Safety: ?Goal: Ability to remain free from injury will improve ?Outcome: Progressing ?  ?

## 2021-08-30 NOTE — Care Plan (Signed)
Patient with no post procedural pain. Will need surgical consult for potential cholecystectomy. We will sign-off, please call with any questions or concerns.  Merlyn Lot MD, MPH [facility]

## 2021-08-30 NOTE — Consult Note (Signed)
[facility] SURGICAL CONSULTATION NOTE (initial) - cpt: 47096   HISTORY OF PRESENT ILLNESS (HPI):  Asked to see this 70 y.o. female who underwent successful ERCP with clearance of the common bile duct yesterday by Dr. Servando Snare.  Patient cannot explain exactly why she got an ultrasound but is reportedly was dilated along with an elevated total bilirubin thus prompting an MRCP.  MRCP suggested a 4 mm distal common bile duct stone.  However the report of the MRCP does not reveal/describe cholelithiasis.  And I do not have the report on the outside ultrasound that showed the dilated common bile duct.  Patient claims to be very comfortable, having very little interest in more surgery. Surgery is consulted by attending physician Dr. Fran Lowes in this context for evaluation and management her gallbladder following sphincterotomy for obstructive jaundice.   PAST MEDICAL HISTORY (PMH):  Past Medical History:  Diagnosis Date   Diabetes mellitus without complication (HCC)    Hypertension      PAST SURGICAL HISTORY (PSH):  Past Surgical History:  Procedure Laterality Date   ERCP N/A 08/29/2021   Procedure: ENDOSCOPIC RETROGRADE CHOLANGIOPANCREATOGRAPHY (ERCP);  Surgeon: Midge Minium, MD;  Location: [facility] ENDOSCOPY;  Service: Endoscopy;  Laterality: N/A;     MEDICATIONS:  Prior to Admission medications   Medication Sig Start Date End Date Taking? Authorizing Provider  amLODipine (NORVASC) 5 MG tablet Take 5 mg by mouth daily. 08/07/21  Yes [provider]  busPIRone (BUSPAR) 5 MG tablet Take 5 mg by mouth 2 (two) times daily. 08/07/21  Yes [provider]  feeding supplement, ENSURE ENLIVE, (ENSURE ENLIVE) LIQD Take 237 mLs by mouth 2 (two) times daily between meals. 02/25/20  Yes Enedina Finner, MD  ferrous sulfate 325 (65 FE) MG tablet Take 325 mg by mouth daily with breakfast.   Yes [provider]  folic acid (FOLVITE) 1 MG tablet Take 1 mg by mouth daily.   Yes [provider]  lactulose (CHRONULAC) 10 GM/15ML solution Take 30 mLs by mouth in the morning and at bedtime. 08/19/21  Yes [provider]  loratadine (CLARITIN) 10 MG tablet Take 10 mg by mouth daily.   Yes [provider]  Multiple Vitamin (MULTIVITAMIN WITH MINERALS) TABS tablet Take 1 tablet by mouth daily. 02/26/20  Yes Enedina Finner, MD  progesterone (PROMETRIUM) 200 MG capsule Take 200 mg by mouth at bedtime. 08/07/21  Yes [provider]  sertraline (ZOLOFT) 25 MG tablet Take 25 mg by mouth daily. 08/07/21  Yes [provider]  spironolactone (ALDACTONE) 25 MG tablet Take 25 mg by mouth daily. 08/07/21  Yes [provider]  thiamine 100 MG tablet Take 100 mg by mouth daily.   Yes [provider]  olmesartan (BENICAR) 40 MG tablet Take 1 tablet (40 mg total) by mouth daily. 02/25/20 02/24/21  Enedina Finner, MD     ALLERGIES:  No Known Allergies   SOCIAL HISTORY:  Social History   Socioeconomic History   Marital status: Single    Spouse name: Not on file   Number of children: Not on file   Years of education: Not on file   Highest education level: Not on file  Occupational History   Not on file  Tobacco Use   Smoking status: Former   Smokeless tobacco: Never  Vaping Use   Vaping Use: Never used  Substance and Sexual Activity   Alcohol use: Yes    Comment: daily   Drug use: Never  Sexual activity: Not Currently  Other Topics Concern   Not on file  Social History Narrative   Not on file   Social Determinants of Health   Financial Resource Strain: Not on file  Food Insecurity: Not on file  Transportation Needs: Not on file  Physical Activity: Not on file  Stress: Not on file  Social Connections: Not on file  Intimate Partner Violence: Not on file     FAMILY HISTORY:  Family History  Problem Relation Age of Onset   Heart attack Father       REVIEW OF SYSTEMS:  Review of Systems  Constitutional:  Negative for  chills and fever.  HENT:  Negative for tinnitus.   Eyes:  Negative for blurred vision.  Respiratory:  Negative for cough, shortness of breath and wheezing.   Cardiovascular:  Negative for chest pain.  Gastrointestinal:  Negative for blood in stool, melena and vomiting.  Genitourinary: Negative.   Skin:  Negative for rash.  Neurological: Negative.   Psychiatric/Behavioral: Negative.     VITAL SIGNS:  Temp:  [98 F (36.7 C)-98.6 F (37 C)] 98.2 F (36.8 C) (08/31 1539) Pulse Rate:  [59-99] 65 (08/31 1539) Resp:  [15-29] 18 (08/31 1539) BP: (104-152)/(54-88) 136/66 (08/31 1539) SpO2:  [97 %-100 %] 99 % (08/31 1539)     Height: 5\' 5"  (165.1 cm) Weight: 81.6 kg BMI (Calculated): 29.95   INTAKE/OUTPUT:  08/30 0701 - 08/31 0700 In: 1018.2 [I.V.:1018.2] Out: -   PHYSICAL EXAM:  Physical Exam Blood pressure 136/66, pulse 65, temperature 98.2 F (36.8 C), temperature source Oral, resp. rate 18, height 5\' 5"  (1.651 m), weight 81.6 kg, SpO2 99 %. Last Weight  Most recent update: 08/29/2021  4:06 PM    Weight  81.6 kg (180 lb)             CONSTITUTIONAL: Well developed, and nourished, appropriately responsive and aware without distress.   EYES: Sclera non-icteric.   EARS, NOSE, MOUTH AND THROAT:  The oropharynx is clear. Oral mucosa is pink and moist.    Hearing is intact to voice.  NECK: Trachea is midline, and there is no jugular venous distension.  LYMPH NODES:  Lymph nodes in the neck are not enlarged. RESPIRATORY:  Lungs are clear, and breath sounds are equal bilaterally. Normal respiratory effort without pathologic use of accessory muscles. CARDIOVASCULAR: Heart is regular in rate and rhythm. GI: The abdomen is soft, nontender, and nondistended.  There may be some mild right upper quadrant tenderness but she does not let on.  There were no palpable masses. I did not appreciate hepatosplenomegaly. There were normal bowel sounds.  MUSCULOSKELETAL:  Symmetrical muscle tone  appreciated in all four extremities.    SKIN: Skin turgor is normal. No pathologic skin lesions appreciated.  NEUROLOGIC:  Motor and sensation appear grossly normal.  Cranial nerves are grossly without defect. PSYCH:  Alert and oriented to person, place and time. Affect is appropriate for situation.  Data Reviewed I have personally reviewed what is currently available of the patient's imaging, recent labs and medical records.    Labs:  CBC Latest Ref Rng & Units 08/29/2021 08/28/2021 05/01/2020  WBC 4.0 - 10.5 K/uL 5.3 5.2 3.1(L)  Hemoglobin 12.0 - 15.0 g/dL 10.8(L) 10.3(L) 8.7(L)  Hematocrit 36.0 - 46.0 % 30.3(L) 28.3(L) 27.5(L)  Platelets 150 - 400 K/uL 99(L) 91(L) 59(L)   CMP Latest Ref Rng & Units 08/29/2021 08/28/2021 05/01/2020  Glucose 70 - 99 mg/dL 94 08/30/2021) 07/01/2020)  BUN  8 - 23 mg/dL 14 16 8   Creatinine 0.44 - 1.00 mg/dL 4.80 1.65  Sodium 135 - 145 mmol/L 136 133(L) 140  Potassium 3.5 - 5.1 mmol/L 4.2 4.4 3.6  Chloride 98 - 111 mmol/L 111 108 109  CO2 22 - 32 mmol/L 20(L) 19(L) 23  Calcium 8.9 - 10.3 mg/dL 9.3 9.1 5.37)  Total Protein 6.5 - 8.1 g/dL 6.9 6.6 5.9(L)  Total Bilirubin 0.3 - 1.2 mg/dL 2.2(H) 1.7(H) 2.3(H)  Alkaline Phos 38 - 126 U/L 78 80 78  AST 15 - 41 U/L 31 38 39  ALT 0 - 44 U/L 18 15 26      Imaging studies:   Last 24 hrs: DG C-Arm 01-24-2059 Min-No Report  Result Date: 08/29/2021 Fluoroscopy was utilized by the requesting physician.  No radiographic interpretation.     Assessment/Plan:  70 y.o. 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 postmidnight.  -Will discuss with her possible cholecystectomy    -Follow-up CMP/labs in the morning.  All of the above findings and recommendations were discussed with the patient and  family(if present), and all of patient's questions were answered to their expressed satisfaction. Will follow with you.  Thank you for the opportunity to participate in this patient's care.   -- 02/20/2020, M.D., FACS 08/30/2021, 4:24 PM

## 2021-08-30 NOTE — TOC Initial Note (Addendum)
Transition of Care [facility]) - Initial/Assessment Note    Patient Details  Name: Sheila Bowman MRN: [id number] Date of Birth: 1951-02-01  [facility] CM/SW Contact:    Margarito Liner, LCSW Phone Number: 08/30/2021, 2:48 PM  Clinical Narrative:  Patient not fully oriented. CSW called patient's daughter, introduced role, and explained that discharge planning would be discussed. She confirmed patient is from [facility] and plan is to return at discharge. CSW called and spoke with staff member who confirmed patient is from memory care side. She just started home health services through [facility]. She does not use DME to get around at the facility. [facility] representative will find out what services she's active with and let CSW know.              3:02 pm: Patient is active with [facility] for nursing only.    Expected Discharge Plan: Memory Care Barriers to Discharge: Continued Medical Work up   Patient Goals and CMS Choice     Choice offered to / list presented to : NA  Expected Discharge Plan and Services Expected Discharge Plan: Memory Care     Post Acute Care Choice: Resumption of Svcs/PTA Provider Living arrangements for the past 2 months: Assisted Living Facility (Memory Care)                                      Prior Living Arrangements/Services Living arrangements for the past 2 months: Assisted Living Facility (Memory Care) Lives with:: Facility Resident Patient language and need for interpreter reviewed:: Yes Do you feel safe going back to the place where you live?: Yes      Need for Family Participation in Patient Care: Yes (Comment) Care giver support system in place?: Yes (comment)   Criminal Activity/Legal Involvement Pertinent to Current Situation/Hospitalization: No - Comment as needed  Activities of Daily Living   ADL Screening (condition at time of admission) Patient's cognitive ability adequate to safely complete daily activities?: No Is  the patient deaf or have difficulty hearing?: No Does the patient have difficulty seeing, even when wearing glasses/contacts?: Yes Does the patient have difficulty concentrating, remembering, or making decisions?: Yes Patient able to express need for assistance with ADLs?: Yes Does the patient have difficulty dressing or bathing?: No Independently performs ADLs?: No Communication: Needs assistance Is this a change from baseline?: Pre-admission baseline Toileting: Needs assistance In/Out Bed: Needs assistance Walks in Home: Needs assistance Weakness of Legs: None Weakness of Arms/Hands: None  Permission Sought/Granted Permission sought to share information with : Facility Medical sales representative, Family Supports    Share Information with NAME: [name]  Permission granted to share info w AGENCY: [name]  Permission granted to share info w Relationship: Daughter/HCPOA  Permission granted to share info w Contact Information: [phone]  Emotional Assessment Appearance:: Appears stated age Attitude/Demeanor/Rapport: Unable to Assess Affect (typically observed): Unable to Assess Orientation: : Oriented to Self, Oriented to Place Alcohol / Substance Use: Not Applicable Psych Involvement: No (comment)  Admission diagnosis:  Choledocholithiasis [K80.50] RUQ pain [R10.11] Patient Active Problem List   Diagnosis Date Noted   RUQ pain    Choledocholithiasis 08/28/2021   Hospice care    FTT (failure to thrive) in adult    Acute hepatic encephalopathy 03/26/2020   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 [place] [address] Phone: [phone] Fax: [phone]  [facility] [id number] [place], [place] - [address] [place] [address] Phone: [phone] Fax: [phone]     Social Determinants of Health (SDOH) Interventions    Readmission Risk Interventions No flowsheet data found.

## 2021-08-30 NOTE — Progress Notes (Signed)
PROGRESS NOTE    Sheila Bowman  FBP:[id number] DOB: [birth year] DOA: 08/28/2021 PCP: Toya Smothers, MD   Brief Narrative: Taken from H&P. Sheila Bowman is a 70 y.o. female with medical history significant for type 2 diabetes mellitus, liver cirrhosis, dementia and hypertension, who presented to the emergency room after having an abnormal right upper quadrant ultrasound with common bile duct dilatation of 10.3 mm and suspected choledocholithiasis.  The patient has apparently been having mild epigastric and right upper quadrant abdominal pain and tenderness discovered on physical exam, without nausea and vomiting at her facility. MRCP with concern of dilated extrahepatic biliary duct and a possible gallstone near the ampulla.  Coarse nodular liver suggestive of cirrhosis. GI was consulted and patient is going for ERCP later today.  Subjective: Pt denied abdominal pain.     Assessment & Plan:   Active Problems:   Choledocholithiasis  Choledocholithiasis S/p ERCP on 8/30 MRCP concerning for choledocholithiasis with bile duct dilatation.  No signs of infection. Plan: --GenSurg consult today for possible cholecystectomy   History of liver cirrhosis.   Seems stable. --lactulose PRN  Mild hyponatremia and thrombocytopenia.   Most likely due to liver disease. -Continue to monitor  Essential hypertension. --cont amlodipine, irbesartan, aldactone  Type 2 diabetes mellitus.   Diet controlled at home. --BG has been within inpatient goal --d/c SSI and BG checks  Depression and anxiety. -cont Buspar, and zoloft   Objective: Vitals:   08/29/21 2009 08/30/21 0529 08/30/21 0817 08/30/21 1539  BP: (!) 152/88 (!) 104/54 128/64 136/66  Pulse: 79 (!) 59 71 65  Resp: 16 16 18 18   Temp: 98.6 F (37 C) 98 F (36.7 C) 98.1 F (36.7 C) 98.2 F (36.8 C)  TempSrc:   Oral Oral  SpO2: 98% 97% 99% 99%  Weight:      Height:        Intake/Output Summary (Last 24 hours) at  08/30/2021 1836 Last data filed at 08/30/2021 1416 Gross per 24 hour  Intake 1718.23 ml  Output --  Net 1718.23 ml   Filed Weights   08/29/21 1604  Weight: 81.6 kg    Examination:  Constitutional: NAD, alert, oriented to person and place HEENT: conjunctivae and lids normal, EOMI CV: No cyanosis.   RESP: normal respiratory effort, on RA Extremities: No effusions, edema in BLE SKIN: warm, dry Neuro: II - XII grossly intact.   Psych: Normal mood and affect.  Appropriate judgement and reason   DVT prophylaxis: SCDs Code Status: DNR Family Communication: called daughter and left a message today Disposition Plan:  Status is: Inpatient  The patient is from: 08/31/21  Anticipated d/c is to: [name]  Anticipated d/c date is: 2-3 days Patient currently is not medically stable to d/c due to: possible cholecystectomy pending   All the records are reviewed and case discussed with Care Management/Social Worker. Management plans discussed with the patient, nursing and they are in agreement.  Consultants:  Gastroenterology  Procedures:  Antimicrobials:   Data Reviewed: I have personally reviewed following labs and imaging studies  CBC: Recent Labs  Lab 08/28/21 1530 08/29/21 0438  WBC 5.2 5.3  NEUTROABS 2.9  --   HGB 10.3* 10.8*  HCT 28.3* 30.3*  MCV 97.6 96.5  PLT 91* 99*   Basic Metabolic Panel: Recent Labs  Lab 08/28/21 1530 08/29/21 0438  NA 133* 136  K 4.4 4.2  CL 108 111  CO2 19* 20*  GLUCOSE 130* 94  BUN 16 14  CREATININE 0.99 0.85  CALCIUM 9.1 9.3   GFR: Estimated Creatinine Clearance: 64.9 mL/min (by C-G formula based on SCr of 0.85 mg/dL). Liver Function Tests: Recent Labs  Lab 08/28/21 1530 08/29/21 0438  AST 38 31  ALT 15 18  ALKPHOS 80 78  BILITOT 1.7* 2.2*  PROT 6.6 6.9  ALBUMIN 3.5 3.7   Recent Labs  Lab 08/28/21 1530  LIPASE 53*   No results for input(s): AMMONIA in the last 168 hours. Coagulation Profile: Recent Labs   Lab 08/29/21 0744  INR 1.2   Cardiac Enzymes: No results for input(s): CKTOTAL, CKMB, CKMBINDEX, TROPONINI in the last 168 hours. BNP (last 3 results) No results for input(s): PROBNP in the last 8760 hours. HbA1C: Recent Labs    08/28/21 1530  HGBA1C 5.5   CBG: Recent Labs  Lab 08/29/21 1710 08/29/21 2255 08/30/21 0814 08/30/21 1134 08/30/21 1641  GLUCAP 107* 169* 78 102* 95   Lipid Profile: No results for input(s): CHOL, HDL, LDLCALC, TRIG, CHOLHDL, LDLDIRECT in the last 72 hours. Thyroid Function Tests: No results for input(s): TSH, T4TOTAL, FREET4, T3FREE, THYROIDAB in the last 72 hours. Anemia Panel: No results for input(s): VITAMINB12, FOLATE, FERRITIN, TIBC, IRON, RETICCTPCT in the last 72 hours. Sepsis Labs: No results for input(s): PROCALCITON, LATICACIDVEN in the last 168 hours.  Recent Results (from the past 240 hour(s))  Resp Panel by RT-PCR (Flu A&B, Covid) Nasopharyngeal Swab     Status: None   Collection Time: 08/28/21  3:30 PM   Specimen: Nasopharyngeal Swab; Nasopharyngeal(NP) swabs in vial transport medium  Result Value Ref Gallardo Status   SARS Coronavirus 2 by RT PCR NEGATIVE NEGATIVE Final    Comment: (NOTE) SARS-CoV-2 target nucleic acids are NOT DETECTED.  The SARS-CoV-2 RNA is generally detectable in upper respiratory specimens during the acute phase of infection. The lowest concentration of SARS-CoV-2 viral copies this assay can detect is 138 copies/mL. A negative result does not preclude SARS-Cov-2 infection and should not be used as the sole basis for treatment or other patient management decisions. A negative result may occur with  improper specimen collection/handling, submission of specimen other than nasopharyngeal swab, presence of viral mutation(s) within the areas targeted by this assay, and inadequate number of viral copies(<138 copies/mL). A negative result must be combined with clinical observations, patient history, and  epidemiological information. The expected result is Negative.  Fact Sheet for Patients:  BloggerCourse.com  Fact Sheet for Healthcare Providers:  SeriousBroker.it  This test is no t yet approved or cleared by the [place] FDA and  has been authorized for detection and/or diagnosis of SARS-CoV-2 by FDA under an Emergency Use Authorization (EUA). This EUA will remain  in effect (meaning this test can be used) for the duration of the COVID-19 declaration under Section 564(b)(1) of the Act, 21 U.S.C.section 360bbb-3(b)(1), unless the authorization is terminated  or revoked sooner.       Influenza A by PCR NEGATIVE NEGATIVE Final   Influenza B by PCR NEGATIVE NEGATIVE Final    Comment: (NOTE) The Xpert Xpress SARS-CoV-2/FLU/RSV plus assay is intended as an aid in the diagnosis of influenza from Nasopharyngeal swab specimens and should not be used as a sole basis for treatment. Nasal washings and aspirates are unacceptable for Xpert Xpress SARS-CoV-2/FLU/RSV testing.  Fact Sheet for Patients: BloggerCourse.com  Fact Sheet for Healthcare Providers: SeriousBroker.it  This test is not yet approved or cleared by the [place] FDA and has been authorized for detection and/or diagnosis of  SARS-CoV-2 by FDA under an Emergency Use Authorization (EUA). This EUA will remain in effect (meaning this test can be used) for the duration of the COVID-19 declaration under Section 564(b)(1) of the Act, 21 U.S.C. section 360bbb-3(b)(1), unless the authorization is terminated or revoked.  Performed at [facility], [address], [place], [place] [address]      Radiology Studies: MR 3D Recon At Scanner  Result Date: 08/28/2021 CLINICAL DATA:  Epigastric pain, reported outside right upper quadrant ultrasound with dilated CBD EXAM: MRI ABDOMEN WITHOUT AND WITH CONTRAST  (INCLUDING MRCP) TECHNIQUE: Multiplanar multisequence MR imaging of the abdomen was performed both before and after the administration of intravenous contrast. Heavily T2-weighted images of the biliary and pancreatic ducts were obtained, and three-dimensional MRCP images were rendered by post processing. CONTRAST:  7.55mL GADAVIST GADOBUTROL 1 MMOL/ML IV SOLN COMPARISON:  CT abdomen pelvis, 05/01/2020 FINDINGS: Examination is generally limited by breath motion artifact throughout Lower chest: No acute findings. Hepatobiliary: Somewhat coarse, nodular appearance of the liver. No mass or other parenchymal abnormality identified. Mild intra and extrahepatic biliary ductal dilatation, common bile duct measuring up to 1.2 cm. Despite limitations of the examination, suspect a small gallstone near the ampulla measuring 4 mm (series 5, image 21, series 2, image 15). Pancreas: No mass, inflammatory changes, or other parenchymal abnormality identified. No pancreatic ductal dilatation. Spleen:  Within normal limits in size and appearance. Adrenals/Urinary Tract: No masses identified. No evidence of hydronephrosis. Stomach/Bowel: Visualized portions within the abdomen are unremarkable. Vascular/Lymphatic: No pathologically enlarged lymph nodes identified. No abdominal aortic aneurysm demonstrated. Other:  None. Musculoskeletal: No suspicious bone lesions identified. IMPRESSION: 1. Examination is generally limited by breath motion artifact throughout. There is mild intra and extrahepatic biliary ductal dilatation, common bile duct measuring up to 1.2 cm. Despite limitations of the examination, suspect a small gallstone near the ampulla measuring approximately 4 mm. 2. Somewhat coarse, nodular appearance of the liver, suggestive of cirrhosis. Electronically Signed   By: Lauralyn Primes M.D.   On: 08/28/2021 19:41   DG C-Arm 1-60 Min-No Report  Result Date: 08/29/2021 Fluoroscopy was utilized by the requesting physician.  No  radiographic interpretation.   MR ABDOMEN MRCP W WO CONTAST  Result Date: 08/28/2021 CLINICAL DATA:  Epigastric pain, reported outside right upper quadrant ultrasound with dilated CBD EXAM: MRI ABDOMEN WITHOUT AND WITH CONTRAST (INCLUDING MRCP) TECHNIQUE: Multiplanar multisequence MR imaging of the abdomen was performed both before and after the administration of intravenous contrast. Heavily T2-weighted images of the biliary and pancreatic ducts were obtained, and three-dimensional MRCP images were rendered by post processing. CONTRAST:  7.39mL GADAVIST GADOBUTROL 1 MMOL/ML IV SOLN COMPARISON:  CT abdomen pelvis, 05/01/2020 FINDINGS: Examination is generally limited by breath motion artifact throughout Lower chest: No acute findings. Hepatobiliary: Somewhat coarse, nodular appearance of the liver. No mass or other parenchymal abnormality identified. Mild intra and extrahepatic biliary ductal dilatation, common bile duct measuring up to 1.2 cm. Despite limitations of the examination, suspect a small gallstone near the ampulla measuring 4 mm (series 5, image 21, series 2, image 15). Pancreas: No mass, inflammatory changes, or other parenchymal abnormality identified. No pancreatic ductal dilatation. Spleen:  Within normal limits in size and appearance. Adrenals/Urinary Tract: No masses identified. No evidence of hydronephrosis. Stomach/Bowel: Visualized portions within the abdomen are unremarkable. Vascular/Lymphatic: No pathologically enlarged lymph nodes identified. No abdominal aortic aneurysm demonstrated. Other:  None. Musculoskeletal: No suspicious bone lesions identified. IMPRESSION: 1. Examination is generally limited by breath motion  artifact throughout. There is mild intra and extrahepatic biliary ductal dilatation, common bile duct measuring up to 1.2 cm. Despite limitations of the examination, suspect a small gallstone near the ampulla measuring approximately 4 mm. 2. Somewhat coarse, nodular  appearance of the liver, suggestive of cirrhosis. Electronically Signed   By: Lauralyn PrimesAlex  Bibbey M.D.   On: 08/28/2021 19:41    Scheduled Meds:  amLODipine  5 mg Oral Daily   busPIRone  5 mg Oral BID   ferrous sulfate  325 mg Oral Q breakfast   folic acid  1 mg Oral Daily   insulin aspart  0-9 Units Subcutaneous TID AC & HS   irbesartan  37.5 mg Oral Daily   loratadine  10 mg Oral Daily   multivitamin with minerals  1 tablet Oral Daily   progesterone  200 mg Oral QHS   sertraline  25 mg Oral Daily   spironolactone  25 mg Oral Daily   thiamine  100 mg Oral Daily   Continuous Infusions:     LOS: 2 days    Darlin Priestlyina Kaizen Ibsen, MD [facility]  If 7PM-7AM, please contact night-coverage [email or web address]  08/30/2021, 6:36 PM

## 2021-08-31 ENCOUNTER — Inpatient Hospital Stay: Payer: Medicare Other

## 2021-08-31 DIAGNOSIS — R799 Abnormal finding of blood chemistry, unspecified: Secondary | ICD-10-CM

## 2021-08-31 LAB — COMPREHENSIVE METABOLIC PANEL
ALT: 17 U/L (ref 0–44)
AST: 33 U/L (ref 15–41)
Albumin: 3.6 g/dL (ref 3.5–5.0)
Alkaline Phosphatase: 77 U/L (ref 38–126)
Anion gap: 6 (ref 5–15)
BUN: 12 mg/dL (ref 8–23)
CO2: 21 mmol/L — ABNORMAL LOW (ref 22–32)
Calcium: 9.2 mg/dL (ref 8.9–10.3)
Chloride: 110 mmol/L (ref 98–111)
Creatinine, Ser: 0.92 mg/dL (ref 0.44–1.00)
GFR, Estimated: >60 mL/min (ref >60)
Glucose, Bld: 129 mg/dL — ABNORMAL HIGH (ref 70–99)
Potassium: 4.2 mmol/L (ref 3.5–5.1)
Sodium: 137 mmol/L (ref 135–145)
Total Bilirubin: 3.2 mg/dL — ABNORMAL HIGH (ref 0.3–1.2)
Total Protein: 6.7 g/dL (ref 6.5–8.1)

## 2021-08-31 LAB — MAGNESIUM: Magnesium: 1.9 mg/dL (ref 1.7–2.4)

## 2021-08-31 LAB — CBC
HCT: 29.3 % — ABNORMAL LOW (ref 36.0–46.0)
Hemoglobin: 10.7 g/dL — ABNORMAL LOW (ref 12.0–15.0)
MCH: 35.2 pg — ABNORMAL HIGH (ref 26.0–34.0)
MCHC: 36.5 g/dL — ABNORMAL HIGH (ref 30.0–36.0)
MCV: 96.4 fL (ref 80.0–100.0)
Platelets: 85 10*3/uL — ABNORMAL LOW (ref 150–400)
RBC: 3.04 MIL/uL — ABNORMAL LOW (ref 3.87–5.11)
RDW: 12.3 % (ref 11.5–15.5)
WBC: 8.6 10*3/uL (ref 4.0–10.5)
nRBC: 0 % (ref 0.0–0.2)

## 2021-08-31 MED ORDER — BOOST / RESOURCE BREEZE PO LIQD CUSTOM
1.0000 | Freq: Three times a day (TID) | ORAL | Status: DC
  Administered 2021-08-31 – 2021-09-01 (×3): 1 via ORAL

## 2021-08-31 NOTE — Progress Notes (Signed)
Patient had nausea and small amount of vomited clear mucous, zofran IV was given. Abdominal pain 5/10, refused Tylenol. Fell asleep shortly after and has been resting well. Has been NPO since midnight.

## 2021-08-31 NOTE — Progress Notes (Signed)
[facility] SURGICAL PROGRESS NOTE  Hospital Day(s): 3.   Post op day(s): 2 Days Post-Op. ERCP with sphincterotomy.  Interval History: Patient seen and examined, no acute events or new complaints overnight. Patient reports feeling well, having no interest in any additional procedures.  Review of Systems:  Constitutional: denies fever, chills  Respiratory: denies any shortness of breath  Cardiovascular: denies chest pain or palpitations  Gastrointestinal: denies abdominal pain, N/V, or diarrhea/and bowel function as per interval history Musculoskeletal: denies pain, decreased motor or sensation Integumentary: denies any other rashes or skin discolorations  Vital signs in last 24 hours: [min-max] current  Temp:  [98.2 F (36.8 C)-98.9 F (37.2 C)] 98.7 F (37.1 C) (09/01 0751) Pulse Rate:  [58-83] 83 (09/01 0751) Resp:  [18-20] 18 (09/01 0350) BP: (103-147)/(61-72) 147/71 (09/01 0751) SpO2:  [95 %-99 %] 95 % (09/01 0751)     Height: 5\' 5"  (165.1 cm) Weight: 81.6 kg BMI (Calculated): 29.95   Intake/Output last 2 shifts:  08/31 0701 - 09/01 0700 In: 900 [P.O.:900] Out: -    Physical Exam:  Constitutional: alert, cooperative and no distress  Respiratory: breathing non-labored at rest  Cardiovascular: regular rate and sinus rhythm  Gastrointestinal: soft, non-tender, and non-distended Integumentary: Intact without ulcers or wounds.  Labs:  CBC Latest Ref Rng & Units 08/31/2021 08/29/2021 08/28/2021  WBC 4.0 - 10.5 K/uL 8.6 5.3 5.2  Hemoglobin 12.0 - 15.0 g/dL 10.7(L) 10.8(L) 10.3(L)  Hematocrit 36.0 - 46.0 % 29.3(L) 30.3(L) 28.3(L)  Platelets 150 - 400 K/uL 85(L) 99(L) 91(L)   CMP Latest Ref Rng & Units 08/31/2021 08/29/2021 08/28/2021  Glucose 70 - 99 mg/dL 08/30/2021) 94 831(D)  BUN 8 - 23 mg/dL 12 14 16   Creatinine 0.44 - 1.00 mg/dL 176(H 6.07  Sodium 135 - 145 mmol/L 137 136 133(L)  Potassium 3.5 - 5.1 mmol/L 4.2 4.2 4.4  Chloride 98 - 111 mmol/L 110 111 108   CO2 22 - 32 mmol/L 21(L) 20(L) 19(L)  Calcium 8.9 - 10.3 mg/dL 9.2 9.3 9.1  Total Protein 6.5 - 8.1 g/dL 6.7 6.9 6.6  Total Bilirubin 0.3 - 1.2 mg/dL 3.2(H) 2.2(H) 1.7(H)  Alkaline Phos 38 - 126 U/L 77 78 80  AST 15 - 41 U/L 33 31 38  ALT 0 - 44 U/L 17 18 15      Imaging studies:  CLINICAL DATA:  Right upper quadrant pain. Biliary dilatation on MRI.   EXAM: ULTRASOUND ABDOMEN LIMITED RIGHT UPPER QUADRANT   COMPARISON:  MRI abdomen 08/28/2021   FINDINGS: Gallbladder:   Distended gallbladder. Gallbladder wall thickening 6 mm. No gallstones. Negative sonographic Murphy's sign. Negative for pericholecystic fluid.   Common bile duct:   Diameter: Dilated common bile duct up to 12.9 mm.   Liver:   Increased echogenicity liver diffusely. Mild nodularity of the liver capsule suggesting cirrhosis. No liver lesion. Bidirectional flow in the portal vein by Doppler. Portal vein is patent.   Other: No ascites   IMPRESSION: Common bile duct dilated 12.9 mm. Findings suggest distal obstruction which could be due to stone or stricture.   Probable cirrhosis of the liver   Distended gallbladder with gallbladder thickening but no gallstones.   Bidirectional flow in the portal vein by Doppler.     Electronically Signed   By: 0.62 M.D.   On: 08/31/2021 12:35   Assessment/Plan:  70 y.o. 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  She may have acalculus cholecystitis, I would not expect her to tolerate a diet if she does.  -Persistently elevated total bilirubin, anticipating resolution.  No evidence of elevated transaminases, or abnormal alkaline phosphatase to suggest active biliary disease.  -Patient is not interested in elective surgery at this time.  -Would advance diet as tolerated and proceed with discharge as able.  We will be glad to reconsult should any concerns arise.  All of the above findings and recommendations were discussed with the patient, and the medical team, and all of patient's questions were answered to their expressed satisfaction.  -- Campbell Lerner, M.D., [facility] 08/31/2021

## 2021-08-31 NOTE — Progress Notes (Addendum)
PROGRESS NOTE    Sheila Bowman  TKZ:[id number] DOB: 1951-06-26 DOA: 08/28/2021 PCP: Toya Smothers, MD   Brief Narrative: Taken from H&P. Sheila Bowman is a 70 y.o. 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  The patient has apparently been having mild epigastric and right upper quadrant abdominal pain and tenderness discovered on physical exam, without nausea and vomiting at her facility. MRCP with concern of dilated extrahepatic biliary duct and a possible gallstone near the ampulla.  Coarse nodular liver suggestive of cirrhosis.   Subjective: Today, pt reported abdominal discomfort that pt said was chronic.    After discussion with Surgery, pt, daughter and I, decision was made not to proceed with cholecystectomy.     Assessment & Plan:   Active Problems:   Choledocholithiasis  Choledocholithiasis S/p ERCP on 8/30 MRCP concerning for choledocholithiasis with bile duct dilatation.  No signs of infection. --GenSurg consulted for possible cholecystectomy Plan: --after discussion with Surgery, pt, daughter and I, decision was made not to proceed with cholecystectomy.    History of alcoholic liver cirrhosis Hx of hepatic encephalopathy Seems stable.  Currently awake and alert. --lactulose PRN  Mild hyponatremia and thrombocytopenia.   Most likely due to liver disease. -Continue to monitor  Essential hypertension. --cont amlodipine, irbesartan, aldactone  Type 2 diabetes mellitus.   Diet controlled at home. --BG has been within inpatient goal --no need for SSI  Depression and anxiety. --cont Buspar and Zoloft  Wernicke's Korsakoff syndrome --per daughter, pt  is known to fabricate stories of why she lives in [place].   --cont folic acid and thiamine    Objective: Vitals:   08/30/21 1539 08/30/21 2036 08/31/21 0350 08/31/21 0751  BP: 136/66 103/61 128/72 (!) 147/71  Pulse: 65 (!) 58 79 83  Resp: 18 20 18    Temp: 98.2 F (36.8 C) 98.3 F (36.8 C) 98.9 F (37.2 C) 98.7 F (37.1 C)  TempSrc: Oral Oral  Oral  SpO2: 99% 96% 99% 95%  Weight:      Height:        Intake/Output Summary (Last 24 hours) at 08/31/2021 1625 Last data filed at 08/31/2021 0122 Gross per 24 hour  Intake 0 ml  Output --  Net 0 ml   Filed Weights   08/29/21 1604  Weight: 81.6 kg    Examination:  Constitutional: NAD, alert, oriented to person and place HEENT: conjunctivae and lids normal, EOMI CV: No cyanosis.   RESP: normal respiratory effort, on RA Extremities: No effusions, edema in BLE SKIN: warm, dry Neuro: II - XII grossly intact.   Psych: Normal mood and affect.     DVT prophylaxis: SCDs Code Status: DNR Family Communication: daughter updated on the phone today Disposition Plan:  Status is: Inpatient  The patient is from: 08/31/21  Anticipated d/c is to: [name]  Anticipated d/c date is: tomorrow Patient currently is stable to d/c.   All the records are reviewed and case discussed with Care Management/Social Worker. Management plans discussed with the patient, nursing and they are in agreement.  Consultants:  Gastroenterology  Procedures:  Antimicrobials:   Data Reviewed: I have personally reviewed following labs and imaging studies  CBC: Recent Labs  Lab 08/28/21 1530 08/29/21 0438 08/31/21 0616  WBC 5.2  5.3 8.6  NEUTROABS 2.9  --   --   HGB 10.3* 10.8* 10.7*  HCT 28.3* 30.3* 29.3*  MCV 97.6 96.5 96.4  PLT 91* 99* 85*   Basic Metabolic Panel: Recent Labs  Lab 08/28/21 1530 08/29/21 0438 08/31/21 0616  NA 133* 136 137  K 4.4 4.2 4.2  CL 108 111 110  CO2 19* 20* 21*  GLUCOSE 130* 94 129*  BUN 16 14 12    CREATININE 0.99 0.85 0.92  CALCIUM 9.1 9.3 9.2  MG  --   --  1.9   GFR: Estimated Creatinine Clearance: 60 mL/min (by C-G formula based on SCr of 0.92 mg/dL). Liver Function Tests: Recent Labs  Lab 08/28/21 1530 08/29/21 0438 08/31/21 0616  AST 38 31 33  ALT 15 18 17   ALKPHOS 80 78 77  BILITOT 1.7* 2.2* 3.2*  PROT 6.6 6.9 6.7  ALBUMIN 3.5 3.7 3.6   Recent Labs  Lab 08/28/21 1530  LIPASE 53*   No results for input(s): AMMONIA in the last 168 hours. Coagulation Profile: Recent Labs  Lab 08/29/21 0744  INR 1.2   Cardiac Enzymes: No results for input(s): CKTOTAL, CKMB, CKMBINDEX, TROPONINI in the last 168 hours. BNP (last 3 results) No results for input(s): PROBNP in the last 8760 hours. HbA1C: No results for input(s): HGBA1C in the last 72 hours.  CBG: Recent Labs  Lab 08/29/21 1710 08/29/21 2255 08/30/21 0814 08/30/21 1134 08/30/21 1641  GLUCAP 107* 169* 78 102* 95   Lipid Profile: No results for input(s): CHOL, HDL, LDLCALC, TRIG, CHOLHDL, LDLDIRECT in the last 72 hours. Thyroid Function Tests: No results for input(s): TSH, T4TOTAL, FREET4, T3FREE, THYROIDAB in the last 72 hours. Anemia Panel: No results for input(s): VITAMINB12, FOLATE, FERRITIN, TIBC, IRON, RETICCTPCT in the last 72 hours. Sepsis Labs: No results for input(s): PROCALCITON, LATICACIDVEN in the last 168 hours.  Recent Results (from the past 240 hour(s))  Resp Panel by RT-PCR (Flu A&B, Covid) Nasopharyngeal Swab     Status: None   Collection Time: 08/28/21  3:30 PM   Specimen: Nasopharyngeal Swab; Nasopharyngeal(NP) swabs in vial transport medium  Result Value Ref Fielden Status   SARS Coronavirus 2 by RT PCR NEGATIVE NEGATIVE Final    Comment: (NOTE) SARS-CoV-2 target nucleic acids are NOT DETECTED.  The SARS-CoV-2 RNA is generally detectable in upper respiratory specimens during the acute phase of infection. The lowest concentration of SARS-CoV-2 viral copies this assay can detect  is 138 copies/mL. A negative result does not preclude SARS-Cov-2 infection and should not be used as the sole basis for treatment or other patient management decisions. A negative result may occur with  improper specimen collection/handling, submission of specimen other than nasopharyngeal swab, presence of viral mutation(s) within the areas targeted by this assay, and inadequate number of viral copies(<138 copies/mL). A negative result must be combined with clinical observations, patient history, and epidemiological information. The expected result is Negative.  Fact Sheet for Patients:  09/01/21  Fact Sheet for Healthcare Providers:  08/30/21  This test is no t yet approved or cleared by the [employer] FDA and  has been authorized for detection and/or diagnosis of SARS-CoV-2 by FDA under an Emergency Use Authorization (EUA). This EUA will remain  in effect (meaning this test can be used) for the duration of the COVID-19 declaration under Section 564(b)(1) of the Act, 21 U.S.C.section 360bbb-3(b)(1), unless the authorization is terminated  or revoked sooner.       Influenza A by PCR  NEGATIVE NEGATIVE Final   Influenza B by PCR NEGATIVE NEGATIVE Final    Comment: (NOTE) The Xpert Xpress SARS-CoV-2/FLU/RSV plus assay is intended as an aid in the diagnosis of influenza from Nasopharyngeal swab specimens and should not be used as a sole basis for treatment. Nasal washings and aspirates are unacceptable for Xpert Xpress SARS-CoV-2/FLU/RSV testing.  Fact Sheet for Patients: [employer]  Fact Sheet for Healthcare Providers: [employer]ker[employer]it  This test is not yet approved or cleared by the [place] FDA and has been authorized for detection and/or diagnosis of SARS-CoV-2 by FDA under an Emergency Use Authorization (EUA). This EUA will remain in effect  (meaning this test can be used) for the duration of the COVID-19 declaration under Section 564(b)(1) of the Act, 21 U.S.C. section 360bbb-3(b)(1), unless the authorization is terminated or revoked.  Performed at [facility], [address], [place], [place] [address]      Radiology Studies: DG C-Arm 1-60 Min-No Report  Result Date: 08/29/2021 Fluoroscopy was utilized by the requesting physician.  No radiographic interpretation.   US Abdomen Limited RUQ (LIVER/GB)  Result Date: 08/31/2021 CLINICAL DATA:  Right upper quadrant pain. Biliary dilatation on MRI. EXAM: ULTRASOUND ABDOMEN LIMITED RIGHT UPPER QUADRANT COMPARISON:  MRI abdomen 08/28/2021 FINDINGS: Gallbladder: Distended gallbladder. Gallbladder wall thickening 6 mm. No gallstones. Negative sonographic Murphy's sign. Negative for pericholecystic fluid. Common bile duct: Diameter: Dilated common bile duct up to 12.9 mm. Liver: Increased echogenicity liver diffusely. Mild nodularity of the liver capsule suggesting cirrhosis. No liver lesion. Bidirectional flow in the portal vein by Doppler. Portal vein is patent. Other: No ascites IMPRESSION: Common bile duct dilated 12.9 mm. Findings suggest distal obstruction which could be due to stone or stricture. Probable cirrhosis of the liver Distended gallbladder with gallbladder thickening but no gallstones. Bidirectional flow in the portal vein by Doppler. Electronically Signed   By: Marlan Palau M.D.   On: 08/31/2021 12:35    Scheduled Meds:  amLODipine  5 mg Oral Daily   busPIRone  5 mg Oral BID   feeding supplement  1 Container Oral TID BM   ferrous sulfate  325 mg Oral Q breakfast   folic acid  1 mg Oral Daily   irbesartan  37.5 mg Oral Daily   loratadine  10 mg Oral Daily   multivitamin with minerals  1 tablet Oral Daily   progesterone  200 mg Oral QHS   sertraline  25 mg Oral Daily   spironolactone  25 mg Oral Daily   thiamine  100 mg Oral Daily   Continuous  Infusions:     LOS: 3 days    Darlin Priestly, MD [facility]  If 7PM-7AM, please contact night-coverage [email or web address]  08/31/2021, 4:25 PM

## 2021-08-31 NOTE — TOC Progression Note (Signed)
Transition of Care [facility]) - Progression Note    Patient Details  Name: Sheila Bowman MRN: [id number] Date of Birth: [birth year]  Transition of Care [facility]) CM/SW Contact  Chapman Fitch, RN Phone Number: 08/31/2021, 10:26 AM  Clinical Narrative:    Per surgery :Right upper quadrant ultrasound in a.m., n.p.o. postmidnight.             -Will discuss with her possible cholecystectomy   At discharge will need Fl2 with updated medications for Memory Care ALF  Expected Discharge Plan: Memory Care Barriers to Discharge: Continued Medical Work up  Expected Discharge Plan and Services Expected Discharge Plan: Memory Care     Post Acute Care Choice: Resumption of Svcs/PTA Provider Living arrangements for the past 2 months: Assisted Living Facility (Memory Care)                                       Social Determinants of Health (SDOH) Interventions    Readmission Risk Interventions No flowsheet data found.

## 2021-08-31 NOTE — TOC Progression Note (Signed)
Transition of Care [facility]) - Progression Note    Patient Details  Name: Sheila Bowman MRN: [id number] Date of Birth: [birth year]  Transition of Care [facility]) CM/SW Contact  Chapman Fitch, RN Phone Number: 08/31/2021, 4:32 PM  Clinical Narrative:      VM left with [name] at [facility] to notify that patient is ready for discharge   Expected Discharge Plan: Memory Care Barriers to Discharge: Continued Medical Work up  Expected Discharge Plan and Services Expected Discharge Plan: Memory Care     Post Acute Care Choice: Resumption of Svcs/PTA Provider Living arrangements for the past 2 months: Assisted Living Facility [facility])                                       Social Determinants of Health (SDOH) Interventions    Readmission Risk Interventions No flowsheet data found.

## 2021-08-31 NOTE — Progress Notes (Signed)
Initial Nutrition Assessment  DOCUMENTATION CODES:   Not applicable  INTERVENTION:   Boost Breeze po TID, each supplement provides 250 kcal and 9 grams of protein  MVI po daily   Ensure Enlive po BID with diet advancement, each supplement provides 350 kcal and 20 grams of protein  Pt at high refeed risk; recommend monitor potassium, magnesium and phosphorus labs daily until stable  NUTRITION DIAGNOSIS:   Inadequate oral intake related to acute illness as evidenced by per patient/family report.  GOAL:   Patient will meet greater than or equal to 90% of their needs  MONITOR:   PO intake, Supplement acceptance, Labs, Weight trends, Skin, I & O's, Diet advancement  REASON FOR ASSESSMENT:   Malnutrition Screening Tool    ASSESSMENT:   70 y.o. female with medical history significant for type 2 diabetes mellitus, liver cirrhosis, etoh abuse, dementia, Wernicke's encephalopathy, HTN, depression, anxiety and hypertension who is admitted with choledocholithiasis now s/p ERCP with stone removel 8/30.  Met with pt in room today. Pt reports good appetite and oral intake at baseline but reports that her appetite has been decreased for the past week. Pt reports that she has not been eating much in hospital, mostly because she has been on NPO/clear liquid diet since admission for procedures. Pt currently on clear liquids. Pt reports that she has been drinking some liquids but not really eating much. RD will add supplements to help pt meet her estimated needs. Pt would like to have chocolate Ensure once her diet advances as she drinks chocolate Boost at home. Per chart, pt appears fairly weight stable at baseline; pt reports her UBW is ~165lbs. Pt is currently documented to be up ~15lbs from her UBW. Pt being evaluated for possible cholecystectomy.   Medications reviewed and include: ferrous sulfate, folic acid, MVI, aldactone, thiamine   Labs reviewed: K 4.2 wnl, Mg 1.9 wnl Hgb 10.7(L), Hct  29.3(L)  NUTRITION - FOCUSED PHYSICAL EXAM:  Flowsheet Row Most Recent Value  Orbital Region No depletion  Upper Arm Region No depletion  Thoracic and Lumbar Region No depletion  Buccal Region No depletion  Temple Region No depletion  Clavicle Bone Region No depletion  Clavicle and Acromion Bone Region No depletion  Scapular Bone Region No depletion  Dorsal Hand No depletion  Patellar Region Moderate depletion  Anterior Thigh Region Moderate depletion  Posterior Calf Region Moderate depletion  Edema (RD Assessment) None  Hair Reviewed  Eyes Reviewed  Mouth Reviewed  Skin Reviewed  Nails Reviewed   Diet Order:   Diet Order             Diet clear liquid Room service appropriate? Yes; Fluid consistency: Thin  Diet effective now                  EDUCATION NEEDS:   Education needs have been addressed  Skin:  Skin Assessment: Reviewed RN Assessment  Last BM:  pta  Height:   Ht Readings from Last 1 Encounters:  08/29/21 $RemoveB'5\' 5"'cCnisreC$  (1.651 m)    Weight:   Wt Readings from Last 1 Encounters:  08/29/21 81.6 kg    Ideal Body Weight:  56.8 kg  BMI:  Body mass index is 29.95 kg/m.  Estimated Nutritional Needs:   Kcal:  1700-1900kcal/day  Protein:  85-95g/day  Fluid:  1.4-1.7L/day  Koleen Distance MS, RD, LDN Please refer to [facility] for RD and/or RD on-call/weekend/after hours pager

## 2021-09-01 LAB — COMPREHENSIVE METABOLIC PANEL
ALT: 16 U/L (ref 0–44)
AST: 30 U/L (ref 15–41)
Albumin: 3.5 g/dL (ref 3.5–5.0)
Alkaline Phosphatase: 71 U/L (ref 38–126)
Anion gap: 4 — ABNORMAL LOW (ref 5–15)
BUN: 13 mg/dL (ref 8–23)
CO2: 23 mmol/L (ref 22–32)
Calcium: 8.8 mg/dL — ABNORMAL LOW (ref 8.9–10.3)
Chloride: 110 mmol/L (ref 98–111)
Creatinine, Ser: 0.88 mg/dL (ref 0.44–1.00)
GFR, Estimated: >60 mL/min (ref >60)
Glucose, Bld: 90 mg/dL (ref 70–99)
Potassium: 3.8 mmol/L (ref 3.5–5.1)
Sodium: 137 mmol/L (ref 135–145)
Total Bilirubin: 3 mg/dL — ABNORMAL HIGH (ref 0.3–1.2)
Total Protein: 6.4 g/dL — ABNORMAL LOW (ref 6.5–8.1)

## 2021-09-01 LAB — CBC
HCT: 27.9 % — ABNORMAL LOW (ref 36.0–46.0)
Hemoglobin: 9.9 g/dL — ABNORMAL LOW (ref 12.0–15.0)
MCH: 33.9 pg (ref 26.0–34.0)
MCHC: 35.5 g/dL (ref 30.0–36.0)
MCV: 95.5 fL (ref 80.0–100.0)
Platelets: 87 10*3/uL — ABNORMAL LOW (ref 150–400)
RBC: 2.92 MIL/uL — ABNORMAL LOW (ref 3.87–5.11)
RDW: 12.5 % (ref 11.5–15.5)
WBC: 4.2 10*3/uL (ref 4.0–10.5)
nRBC: 0 % (ref 0.0–0.2)

## 2021-09-01 LAB — MAGNESIUM: Magnesium: 2.1 mg/dL (ref 1.7–2.4)

## 2021-09-01 NOTE — TOC Transition Note (Signed)
Transition of Care [facility]) - CM/SW Discharge Note   Patient Details  Name: Sheila Bowman MRN: [id number] Date of Birth: 11-Oct-1951  Transition of Care [facility]) CM/SW Contact:  Liliana Cline, LCSW Phone Number: 09/01/2021, 1:59 PM   Clinical Narrative:   Patient to discharge back to [facility] EMS paperwork completed. EMS called, patient is 3rd on the list as of 2:00 pm. RN aware.   FL2 with DC meds has been faxed to [facility].   CSW attempted multiple calls to daughter, son and brother to inform them of discharge. All 3 numbers kept saying the number is disconnected. Tried from [employer] as well. MD has already spoken with daughter who is aware of DC to [facility] today.     Final next level of care: Memory Care Barriers to Discharge: Barriers Resolved   Patient Goals and CMS Choice     Choice offered to / list presented to : NA  Discharge Placement                Patient to be transferred to facility by: EMS Name of family member notified: attempted calls to son, daughter, and brother Patient and family notified of of transfer: 09/01/21  Discharge Plan and Services     Post Acute Care Choice: Resumption of Svcs/PTA Provider                               Social Determinants of Health (SDOH) Interventions     Readmission Risk Interventions No flowsheet data found.

## 2021-09-01 NOTE — Progress Notes (Signed)
Spoke with Sheila Bowman who states her mom is to discharge to [facility] with EMS.  Ems arrived and transported to facility.

## 2021-09-01 NOTE — Discharge Summary (Signed)
Physician Discharge Summary   [name]  female DOB: 04/27/1951  RXV:[id number]  PCP: Sheila Smothers, MD  Admit date: 08/28/2021 Discharge date: 09/01/2021  Admitted From: Sheila [facility]man  Disposition:  Sheila [facility]  Daughter updated on discharge plans.  CODE STATUS: DNR   Hospital Course:  For full details, please see H&P, progress notes, consult notes and ancillary notes.  Briefly,  Brief Narrative:  Sheila Bowman is a 70 y.o. 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    Choledocholithiasis S/p ERCP on 8/30 MRCP with concern of dilated extrahepatic biliary duct and a possible gallstone near the ampulla.  Coarse nodular liver suggestive of cirrhosis.  No signs of infection.  ERCP found sludge.   --GenSurg consulted for possible cholecystectomy --after discussion with Surgery Dr. Claudine Mouton, pt, daughter and I, decision was made not to proceed with cholecystectomy.     History of alcoholic liver cirrhosis Hx of hepatic encephalopathy Seems stable.  Currently awake and alert. --lactulose PRN   Mild hyponatremia and thrombocytopenia.   Most likely due to liver disease.   Essential hypertension. --cont amlodipine, irbesartan, aldactone   Hx of Type 2 diabetes mellitus, not currently active --A1c 5.5, normal.   --BG has been within inpatient goal --no need for SSI  Depression and anxiety. 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   --cont folic acid and thiamine    Discharge Diagnoses:  Active Problems:   Choledocholithiasis   30 Day Unplanned  Readmission Risk Score    Flowsheet Row ED to Hosp-Admission (Current) from 08/28/2021 in [facility]  30 Day Unplanned Readmission Risk Score (%) 16.22 Filed at 09/01/2021 0801       This score is the patient's risk of an unplanned readmission within 30 days of being discharged (0 -100%). The score is based on dignosis, age, lab data, medications, orders, and past utilization.   Low:  0-14.9   Medium: 15-21.9   High: 22-29.9   Extreme: 30 and above         Discharge Instructions:  Allergies as of 09/01/2021   No Known Allergies      Medication List     TAKE these medications    amLODipine 5 MG tablet Commonly known as: NORVASC Take 5 mg by mouth daily.   busPIRone 5 MG tablet Commonly known as: BUSPAR Take 5 mg by mouth 2 (two) times daily.   feeding supplement Liqd Take 237 mLs by mouth 2 (two) times daily between meals.   ferrous sulfate 325 (65 FE) MG tablet Take 325 mg by mouth daily with breakfast.   folic acid 1 MG tablet Commonly known as: FOLVITE Take 1 mg by mouth daily.   lactulose 10 GM/15ML solution Commonly known as: CHRONULAC Take 30 mLs by mouth in the morning and at bedtime.   loratadine 10 MG tablet Commonly known as: CLARITIN Take 10 mg by mouth daily.   multivitamin with minerals Tabs tablet Take 1 tablet by mouth daily.   olmesartan 40 MG tablet Commonly known as: BENICAR Take 1 tablet (40 mg total) by mouth daily.   progesterone 200 MG capsule Commonly known as: PROMETRIUM  Take 200 mg by mouth at bedtime.   sertraline 25 MG tablet Commonly known as: ZOLOFT Take 25 mg by mouth daily.   spironolactone 25 MG tablet Commonly known as: ALDACTONE Take 25 mg by mouth daily.   thiamine 100 MG tablet Take 100 mg by mouth daily.         Follow-up Information     Aderoju, Marin OlpElizabeth Oyeyemi, MD Follow up in 1 week(s).   Specialty: Internal Medicine Contact information: [address] [place] [address] [phone]                 No Known Allergies   The results of significant diagnostics from this hospitalization (including imaging, microbiology, ancillary and laboratory) are listed below for reference.   Consultations:   Procedures/Studies: MR 3D Recon At Scanner  Result Date: 08/28/2021 CLINICAL DATA:  Epigastric pain, reported outside right upper quadrant ultrasound with dilated CBD EXAM: MRI ABDOMEN WITHOUT AND WITH CONTRAST (INCLUDING MRCP) TECHNIQUE: Multiplanar multisequence MR imaging of the abdomen was performed both before and after the administration of intravenous contrast. Heavily T2-weighted images of the biliary and pancreatic ducts were obtained, and three-dimensional MRCP images were rendered by post processing. CONTRAST:  7.505mL GADAVIST GADOBUTROL 1 MMOL/ML IV SOLN COMPARISON:  CT abdomen pelvis, 05/01/2020 FINDINGS: Examination is generally limited by breath motion artifact throughout Lower chest: No acute findings. Hepatobiliary: Somewhat coarse, nodular appearance of the liver. No mass or other parenchymal abnormality identified. Mild intra and extrahepatic biliary ductal dilatation, common bile duct measuring up to 1.2 cm. Despite limitations of the examination, suspect a small gallstone near the ampulla measuring 4 mm (series 5, image 21, series 2, image 15). Pancreas: No mass, inflammatory changes, or other parenchymal abnormality identified. No pancreatic ductal dilatation. Spleen:  Within normal limits in size and appearance. Adrenals/Urinary Tract: No masses identified. No evidence of hydronephrosis. Stomach/Bowel: Visualized portions within the abdomen are unremarkable. Vascular/Lymphatic: No pathologically enlarged lymph nodes identified. No abdominal aortic aneurysm demonstrated. Other:  None. Musculoskeletal: No suspicious bone lesions identified. IMPRESSION: 1. Examination is generally limited by breath motion artifact throughout.  There is mild intra and extrahepatic biliary ductal dilatation, common bile duct measuring up to 1.2 cm. Despite limitations of the examination, suspect a small gallstone near the ampulla measuring approximately 4 mm. 2. Somewhat coarse, nodular appearance of the liver, suggestive of cirrhosis. Electronically Signed   By: Lauralyn PrimesAlex  Bibbey M.D.   On: 08/28/2021 19:41   DG C-Arm 1-60 Min-No Report  Result Date: 08/29/2021 Fluoroscopy was utilized by the requesting physician.  No radiographic interpretation.   MR ABDOMEN MRCP W WO CONTAST  Result Date: 08/28/2021 CLINICAL DATA:  Epigastric pain, reported outside right upper quadrant ultrasound with dilated CBD EXAM: MRI ABDOMEN WITHOUT AND WITH CONTRAST (INCLUDING MRCP) TECHNIQUE: Multiplanar multisequence MR imaging of the abdomen was performed both before and after the administration of intravenous contrast. Heavily T2-weighted images of the biliary and pancreatic ducts were obtained, and three-dimensional MRCP images were rendered by post processing. CONTRAST:  7.175mL GADAVIST GADOBUTROL 1 MMOL/ML IV SOLN COMPARISON:  CT abdomen pelvis, 05/01/2020 FINDINGS: Examination is generally limited by breath motion artifact throughout Lower chest: No acute findings. Hepatobiliary: Somewhat coarse, nodular appearance of the liver. No mass or other parenchymal abnormality identified. Mild intra and extrahepatic biliary ductal dilatation, common bile duct measuring up to 1.2 cm. Despite limitations of the examination, suspect a small gallstone near the ampulla measuring 4 mm (series 5, image 21, series 2, image 15).  Pancreas: No mass, inflammatory changes, or other parenchymal abnormality identified. No pancreatic ductal dilatation. Spleen:  Within normal limits in size and appearance. Adrenals/Urinary Tract: No masses identified. No evidence of hydronephrosis. Stomach/Bowel: Visualized portions within the abdomen are unremarkable. Vascular/Lymphatic: No pathologically  enlarged lymph nodes identified. No abdominal aortic aneurysm demonstrated. Other:  None. Musculoskeletal: No suspicious bone lesions identified. IMPRESSION: 1. Examination is generally limited by breath motion artifact throughout. There is mild intra and extrahepatic biliary ductal dilatation, common bile duct measuring up to 1.2 cm. Despite limitations of the examination, suspect a small gallstone near the ampulla measuring approximately 4 mm. 2. Somewhat coarse, nodular appearance of the liver, suggestive of cirrhosis. Electronically Signed   By: Lauralyn Primes M.D.   On: 08/28/2021 19:41   US Abdomen Limited RUQ (LIVER/GB)  Result Date: 08/31/2021 CLINICAL DATA:  Right upper quadrant pain. Biliary dilatation on MRI. EXAM: ULTRASOUND ABDOMEN LIMITED RIGHT UPPER QUADRANT COMPARISON:  MRI abdomen 08/28/2021 FINDINGS: Gallbladder: Distended gallbladder. Gallbladder wall thickening 6 mm. No gallstones. Negative sonographic Murphy's sign. Negative for pericholecystic fluid. Common bile duct: Diameter: Dilated common bile duct up to 12.9 mm. Liver: Increased echogenicity liver diffusely. Mild nodularity of the liver capsule suggesting cirrhosis. No liver lesion. Bidirectional flow in the portal vein by Doppler. Portal vein is patent. Other: No ascites IMPRESSION: Common bile duct dilated 12.9 mm. Findings suggest distal obstruction which could be due to stone or stricture. Probable cirrhosis of the liver Distended gallbladder with gallbladder thickening but no gallstones. Bidirectional flow in the portal vein by Doppler. Electronically Signed   By: Marlan Palau M.D.   On: 08/31/2021 12:35      Labs: BNP (last 3 results) No results for input(s): BNP in the last 8760 hours. Basic Metabolic Panel: Recent Labs  Lab 08/28/21 1530 08/29/21 0438 08/31/21 0616 09/01/21 0611  NA 133* 136 137 137  K 4.4 4.2 4.2 3.8  CL 108 111 110 110  CO2 19* 20* 21* 23  GLUCOSE 130* 94 129* 90  BUN 16 14 12 13    CREATININE 0.99 0.85 0.92 0.88  CALCIUM 9.1 9.3 9.2 8.8*  MG  --   --  1.9 2.1   Liver Function Tests: Recent Labs  Lab 08/28/21 1530 08/29/21 0438 08/31/21 0616 09/01/21 0611  AST 38 31 33 30  ALT 15 18 17 16   ALKPHOS 80 78 77 71  BILITOT 1.7* 2.2* 3.2* 3.0*  PROT 6.6 6.9 6.7 6.4*  ALBUMIN 3.5 3.7 3.6 3.5   Recent Labs  Lab 08/28/21 1530  LIPASE 53*   No results for input(s): AMMONIA in the last 168 hours. CBC: Recent Labs  Lab 08/28/21 1530 08/29/21 0438 08/31/21 0616 09/01/21 0611  WBC 5.2 5.3 8.6 4.2  NEUTROABS 2.9  --   --   --   HGB 10.3* 10.8* 10.7* 9.9*  HCT 28.3* 30.3* 29.3* 27.9*  MCV 97.6 96.5 96.4 95.5  PLT 91* 99* 85* 87*   Cardiac Enzymes: No results for input(s): CKTOTAL, CKMB, CKMBINDEX, TROPONINI in the last 168 hours. BNP: Invalid input(s): POCBNP CBG: Recent Labs  Lab 08/29/21 1710 08/29/21 2255 08/30/21 0814 08/30/21 1134 08/30/21 1641  GLUCAP 107* 169* 78 102* 95   D-Dimer No results for input(s): DDIMER in the last 72 hours. Hgb A1c No results for input(s): HGBA1C in the last 72 hours. Lipid Profile No results for input(s): CHOL, HDL, LDLCALC, TRIG, CHOLHDL, LDLDIRECT in the last 72 hours. Thyroid function studies No results for input(s): TSH, T4TOTAL,  T3FREE, THYROIDAB in the last 72 hours.  Invalid input(s): FREET3 Anemia work up No results for input(s): VITAMINB12, FOLATE, FERRITIN, TIBC, IRON, RETICCTPCT in the last 72 hours. Urinalysis    Component Value Date/Time   COLORURINE YELLOW 05/01/2020 2019   APPEARANCEUR CLEAR 05/01/2020 2019   LABSPEC 1.015 05/01/2020 2019   PHURINE 7.0 05/01/2020 2019   GLUCOSEU NEGATIVE 05/01/2020 2019   HGBUR MODERATE (A) 05/01/2020 2019   BILIRUBINUR NEGATIVE 05/01/2020 2019   KETONESUR NEGATIVE 05/01/2020 2019   PROTEINUR NEGATIVE 05/01/2020 2019   NITRITE NEGATIVE 05/01/2020 2019   LEUKOCYTESUR TRACE (A) 05/01/2020 2019   Sepsis Labs Invalid input(s): PROCALCITONIN,  WBC,   LACTICIDVEN Microbiology Recent Results (from the past 240 hour(s))  Resp Panel by RT-PCR (Flu A&B, Covid) Nasopharyngeal Swab     Status: None   Collection Time: 08/28/21  3:30 PM   Specimen: Nasopharyngeal Swab; Nasopharyngeal(NP) swabs in vial transport medium  Result Value Ref [place] Status   SARS Coronavirus 2 by RT PCR NEGATIVE NEGATIVE Final    Comment: (NOTE) SARS-CoV-2 target nucleic acids are NOT DETECTED.  The SARS-CoV-2 RNA is generally detectable in upper respiratory specimens during the acute phase of infection. The lowest concentration of SARS-CoV-2 viral copies this assay can detect is 138 copies/mL. A negative result does not preclude SARS-Cov-2 infection and should not be used as the sole basis for treatment or other patient management decisions. A negative result may occur with  improper specimen collection/handling, submission of specimen other than nasopharyngeal swab, presence of viral mutation(s) within the areas targeted by this assay, and inadequate number of viral copies(<138 copies/mL). A negative result must be combined with clinical observations, patient history, and epidemiological information. The expected result is Negative.  Fact Sheet for Patients:  BloggerCourse.com  Fact Sheet for Healthcare Providers:  SeriousBroker.it  This test is no t yet approved or cleared by the [place] FDA and  has been authorized for detection and/or diagnosis of SARS-CoV-2 by FDA under an Emergency Use Authorization (EUA). This EUA will remain  in effect (meaning this test can be used) for the duration of the COVID-19 declaration under Section 564(b)(1) of the Act, 21 U.S.C.section 360bbb-3(b)(1), unless the authorization is terminated  or revoked sooner.       Influenza A by PCR NEGATIVE NEGATIVE Final   Influenza B by PCR NEGATIVE NEGATIVE Final    Comment: (NOTE) The Xpert Xpress SARS-CoV-2/FLU/RSV plus  assay is intended as an aid in the diagnosis of influenza from Nasopharyngeal swab specimens and should not be used as a sole basis for treatment. Nasal washings and aspirates are unacceptable for Xpert Xpress SARS-CoV-2/FLU/RSV testing.  Fact Sheet for Patients: BloggerCourse.com  Fact Sheet for Healthcare Providers: SeriousBroker.it  This test is not yet approved or cleared by the [place] FDA and has been authorized for detection and/or diagnosis of SARS-CoV-2 by FDA under an Emergency Use Authorization (EUA). This EUA will remain in effect (meaning this test can be used) for the duration of the COVID-19 declaration under Section 564(b)(1) of the Act, 21 U.S.C. section 360bbb-3(b)(1), unless the authorization is terminated or revoked.  Performed at [facility], [address] [place], [place] [address]      Total time spend on discharging this patient, including the last patient exam, discussing the hospital stay, instructions for ongoing care as it relates to all pertinent caregivers, as well as preparing the medical discharge records, prescriptions, and/or referrals as applicable, is 30 minutes.    Inetta Fermo  Fran Lowes, MD  [facility] 09/01/2021, 8:58 AM

## 2021-09-01 NOTE — Care Management Important Message (Signed)
Important Message  Patient Details  Name: Sheila Bowman MRN: [id number] Date of Birth: 29-Sep-1951   Medicare Important Message Given:  Yes     [name] 09/01/2021, 2:57 PM

## 2021-09-01 NOTE — Progress Notes (Signed)
1600 patient refuses adamantly to go with EMS for discharge. EMS states they can't take her without talking to POA.  Attempted call to POA who did not answer.  Alerted MD and caseworker. EMS left due to patient refusal and patient ability to answer orientation questions correctly, despite patient going to memory care unit.

## 2021-09-01 NOTE — Progress Notes (Addendum)
CSW notified by RN Shirlee Limerick that patient is refusing to go with EMS, saying she does not live at [facility]. RN has also been also unable to reach any family due to phone numbers not working.  CSW attempted calls to all 3 family members listed in chart again. All numbers are not working.  CSW spoke to [facility] Supervisor who confirms patient is from [facility] and EMS needs to take her back there. Can call [facility] Supervisor Darl Pikes if any questions. Informed RN who says EMS has already left and said family needs to talk to patient first.    [facility]. Spoke to [place] who confirmed # for [place] that we have is correct. She is going to try to call [facility] as well. She provided another number for son Barbara Cower, [phone]. Spoke to [place] who has been unable to reach [place] via phone today either. He is out of town in [place]. He is agreeable to RN assisting patient with calling him on speaker phone so he can tell her she needs to go back to [facility]. Informed RN Shirlee Limerick.   Added patient back to [facility] EMS nonemergent transport list. Informed dispatcher that they can call CSW back or call [facility] Supervisor if needed.     4:18- Call from EMS Supervisor [place]. He says even though the patient is in memory care that to them she seemed oriented and able to say no. He says patient herself will need to agree to get on the stretcher for EMS to take her to [facility]. If patient agrees, they can come back and get her. She will need to be added back to EMS list once she has calmed down and agrees to go with EMS. He said they cant force her. He said there is now a several hour wait. Updated TOC Supervisor, MD, RN.     4:25- Patient told RN she is willing to go with EMS back to [facility]. Updated EMS Supervisor [place] via phone. He said patient can be added back to list, he said it will be several hours. Called and added patient back to EMS list.  Updated son Barbara Cower. Barbara Cower stated if patient  refuses when EMS comes back to please call him first at [phone] so he can talk to patient before EMS leaves again.  Updated RN, MD, [facility] Supervisor.     Alfonso Ramus, [place] [phone]

## 2021-09-01 NOTE — NC FL2 (Signed)
[place] MEDICAID FL2 LEVEL OF CARE SCREENING TOOL     IDENTIFICATION  Patient Name: Sheila Bowman Birthdate: 21-Feb-1951 Sex: female Admission Date (Current Location): 08/28/2021  [facility] [place] Number:  Chiropodist and Address:  [facility], [address] [place], [place] [address]      Provider Number: [id number]  Attending Physician Name and Address:  Darlin Priestly, MD  Relative Name and Phone Number:  [name] [phone]    Current Level of Care: Hospital Recommended Level of Care: Skilled Nursing Facility Prior Approval Number:    Date Approved/Denied:   PASRR Number:    Discharge Plan:      Current Diagnoses: Patient Active Problem List   Diagnosis Date Noted   RUQ pain    Choledocholithiasis 08/28/2021   Hospice care    FTT (failure to thrive) in adult    Acute hepatic encephalopathy 03/26/2020   Wernicke-Korsakoff syndrome (alcoholic) (HCC) 03/26/2020   Normocytic anemia    Alcohol abuse    Cirrhosis (HCC)    Goals of care, counseling/discussion    DNR (do not resuscitate)    Palliative care by specialist    Failure to thrive in adult    Acute metabolic encephalopathy 02/18/2020   Alcoholic cirrhosis of liver without ascites (HCC) 02/18/2020   Alcohol abuse with alcohol-induced disorder (HCC) 02/18/2020   Hypokalemia 02/18/2020   Type 2 diabetes mellitus without complication (HCC) 02/18/2020   Essential hypertension 02/18/2020    Orientation RESPIRATION BLADDER Height & Weight     Self  Normal Continent Weight: 180 lb (81.6 kg) Height:  5\' 5"  (165.1 cm)  BEHAVIORAL SYMPTOMS/MOOD NEUROLOGICAL BOWEL NUTRITION STATUS      Incontinent Diet (regular, thin liquids)  AMBULATORY STATUS COMMUNICATION OF NEEDS Skin     Verbally Normal                       Personal Care Assistance Level of Assistance              Functional Limitations Info             SPECIAL CARE FACTORS FREQUENCY   (HHRN)                     Contractures      Additional Factors Info  Code Status, Allergies Code Status Info: DNR Allergies Info: nka           Medication List       TAKE these medications     amLODipine 5 MG tablet Commonly known as: NORVASC Take 5 mg by mouth daily.    busPIRone 5 MG tablet Commonly known as: BUSPAR Take 5 mg by mouth 2 (two) times daily.    feeding supplement Liqd Take 237 mLs by mouth 2 (two) times daily between meals.    ferrous sulfate 325 (65 FE) MG tablet Take 325 mg by mouth daily with breakfast.    folic acid 1 MG tablet Commonly known as: FOLVITE Take 1 mg by mouth daily.    lactulose 10 GM/15ML solution Commonly known as: CHRONULAC Take 30 mLs by mouth in the morning and at bedtime.    loratadine 10 MG tablet Commonly known as: CLARITIN Take 10 mg by mouth daily.    multivitamin with minerals Tabs tablet Take 1 tablet by mouth daily.    olmesartan 40 MG tablet Commonly known as: BENICAR Take 1 tablet (40 mg total) by mouth daily.  progesterone 200 MG capsule Commonly known as: PROMETRIUM Take 200 mg by mouth at bedtime.    sertraline 25 MG tablet Commonly known as: ZOLOFT Take 25 mg by mouth daily.    spironolactone 25 MG tablet Commonly known as: ALDACTONE Take 25 mg by mouth daily.    thiamine 100 MG tablet Take 100 mg by mouth daily.         Relevant Imaging Results:  Relevant Lab Results:   Additional Information SS #: [id number]  Aliceson Dolbow E Jamesa Tedrick, LCSW

## 2021-09-01 NOTE — Plan of Care (Signed)
  Problem: Clinical Measurements: Goal: Diagnostic test results will improve Outcome: Progressing   Problem: Pain Managment: Goal: General experience of comfort will improve Outcome: Progressing  Patient to be discharged to ALF [facility].

## 2021-09-01 NOTE — TOC Progression Note (Signed)
Transition of Care [facility]) - Progression Note    Patient Details  Name: Sheila Bowman MRN: [id number] Date of Birth: 02/21/1951  Transition of [facility]) CM/SW Contact  Liliana Cline, LCSW Phone Number: 09/01/2021, 9:39 AM  Clinical Narrative:   Karena Addison. Spoke with both Darrol Angel. They confirmed they are aware patient is DC back to [facility] Unit today. They reported they cannot provide transportation, EMS will need to be arranged. No report needed. Will complete FL2 with DC Meds once DC Summary is in. Updated MD and RN.     Expected Discharge Plan: Memory Care Barriers to Discharge: Continued Medical Work up  Expected Discharge Plan and Services Expected Discharge Plan: Memory Care     Post Acute Care Choice: Resumption of Svcs/PTA Provider Living arrangements for the past 2 months: Assisted Living Facility (Memory Care) Expected Discharge Date: 09/01/21                                     Social Determinants of Health (SDOH) Interventions    Readmission Risk Interventions No flowsheet data found.

## 2021-10-30 ENCOUNTER — Encounter: Payer: Self-pay | Admitting: Nurse Practitioner

## 2021-10-30 ENCOUNTER — Non-Acute Institutional Stay: Payer: Medicare Other | Admitting: Nurse Practitioner

## 2021-10-30 ENCOUNTER — Other Ambulatory Visit: Payer: Self-pay

## 2021-10-30 DIAGNOSIS — R413 Other amnesia: Secondary | ICD-10-CM

## 2021-10-30 DIAGNOSIS — F1096 Alcohol use, unspecified with alcohol-induced persisting amnestic disorder: Secondary | ICD-10-CM

## 2021-10-30 DIAGNOSIS — Z515 Encounter for palliative care: Secondary | ICD-10-CM

## 2021-10-30 NOTE — Progress Notes (Addendum)
[facility] Palliative Care Consult Note Telephone: [phone]  Fax: [phone]    Date of encounter: 10/30/21 9:09 PM PATIENT NAME: Sheila Bowman [id number]   [phone] (home)  DOB: 06-Jun-1951 MRN: [id number] PRIMARY CARE PROVIDER:    Jovita Kussmaul, MD,  [place] [address] [phone]  RESPONSIBLE PARTY:    Contact Information     Name Relation Home Work Mobile   Sedlak,Grant Brother [phone]     Samuella Cota [phone]  [phone]   Braulio Bosch   [phone]      I met face to face with patient in facility. Palliative Care was asked to follow this patient by consultation request of Irene Limbo NP/Blakely [place] Crane to address advance care planning and complex medical decision making. This is a follow up visit.                                  ASSESSMENT AND PLAN / RECOMMENDATIONS:  Symptom Management/Plan: 1. ACP: DNR in [place]  2. Memory impairment secondary to dementia, continue supportive role, monitor progression  10/30/2021 Weight 162.4 lbs   3. Palliative care encounter; Palliative medicine team will continue to support patient, patient's family, and medical team. Visit consisted of counseling and education dealing with the complex and emotionally intense issues of symptom management and palliative care in the setting of serious and potentially life-threatening illness   3. F/u 2 months ongoing monitoring cognitive decline, weights, appetite   Follow up Palliative Care Visit: Palliative care will continue to follow for complex medical decision making, advance care planning, and clarification of goals. Return 8 weeks or prn.  I spent 61 minutes providing this consultation. More than 50% of the time in this consultation was spent in counseling and care coordination.  PPS: 60%  HOSPICE ELIGIBILITY/DIAGNOSIS: TBD  Chief Complaint: Follow up  palliative consult for complex medical decision making  HISTORY OF PRESENT ILLNESS:  Sheila Bowman is a 70 y.o. year old female  with multiple medical problems including Wernicke's Korsakoff syndrome, Alcoholic cirrhosis, htn, diabetes, alcoholic abuse, failure to thrive. Sheila Bowman continues to reside at locked memory care unit at [facility]. Sheila Bowman is fairly independent as she is able to bathe with assistance, dress herself. Sheila Bowman is able to toilet herself. Sheila Bowman does feed herself with a stable weight. Sheila. Bowman is weighed daily. Staff endorses no new changes or concerns. No recent wounds, falls, hospitalizations, infections. Primary provider visit by Irene Limbo nurse practitioner 09/21/2021 for routine visit with choledocholithiasis General surgery was consulted decided against color cystectomy asymptomatic. Anxiety with depression continue Zoloft and buspar. Anemia continue FERROUS sulfate with routine monitoring of CBC. Cirrhosis of the liver with Wernicke's Korsakoffs syndrome continue Lactulose, spironolactone, multivitamin, folic acid, thiamine continues to monitor daily weights. Hyponatremia with thrombocytopenia likely secondary to liver disease continue lab draw.At present, Sheila. Bowman is lying in bed asleep. Sheila. Bowman awoke to verbal cues. Sheila. Bowman answered no to ROS, no complaints or concerns. Sheila. Bowman talked about going out trick or treating today as it is Halloween. Sheila. Bowman talked about her grandson's, difficulty processing with delusional thinking. Sheila. Bowman was interactive. Sheila. Bowman talked about watching programs on TV but unable to recall. Sheila. Bowman conversed, asking back and forth questions though limited with cognitive impairment. Sheila. Bowman was cooperative with assessment. Emotional support provided. No other changes recommended.  Medical goals reviewed. Updated staff, attempted to contact daughter for update.   09/25/2021 WBC 5.3, hemoglobin 11.4, hematocrit 34.3, platelets  117, sodium 141, potassium 4.1, Co 223, chloride 106, calcium 9.0, bun 16, creatinine 1.03, glucose 146, albumin 4.3, total protein 8.3, ALT 23, AST 21  History obtained from review of EMR, discussion with  facility staff and Sheila Bowman.  I reviewed available labs, medications, imaging, studies and related documents from the EMR.  Records reviewed and summarized above.   ROS Full 10 system review of systems performed and negative with exception of: as per HPI.   Physical Exam: Constitutional: NAD General: pleasant female EYES:  lids intact ENMT: oral mucous membranes moist CV: S1S2, RRR, no LE edema Pulmonary: LCTA, no increased work of breathing, no cough, room air Abdomen: normo-active BS + 4 quadrants, soft and non tender MSK: ambulatory Skin: warm and dry Neuro:  no generalized weakness,  +cognitive impairment Psych: non-anxious affect, A and O x 2  Thank you for the opportunity to participate in the care of Sheila Bowman.  The palliative care team will continue to follow. Please call our office at [phone] if we can be of additional assistance.   This chart was dictated using voice recognition software.  Despite best efforts to proofread,  errors can occur which can change the documentation meaning.   Questions and concerns were addressed. Provided general support and encouragement, no other unmet needs identified   Shruti Arrey Ihor Gully, NP

## 2021-12-21 ENCOUNTER — Non-Acute Institutional Stay: Payer: Medicare Other | Admitting: Nurse Practitioner

## 2021-12-21 ENCOUNTER — Encounter: Payer: Self-pay | Admitting: Nurse Practitioner

## 2021-12-21 NOTE — Progress Notes (Signed)
[facility] Consult Note Telephone: [phone]  Fax: [phone]    Date of encounter: 12/21/21 5:45 PM PATIENT NAME: Sheila Bowman [place] [id number]   [phone] (home)  DOB: 15-Feb-1951 MRN: [id number] PRIMARY CARE PROVIDER:    [facility]  RESPONSIBLE PARTY:    Contact Information     Name Relation Home Work Mobile   Sheila Bowman Brother [phone]     Sheila Bowman [phone]  [phone]   Sheila Bowman   [phone]      I met face to face with patient in facility. Palliative Care was asked to follow this patient by consultation request of  [facility] to address advance care planning and complex medical decision making. This is a follow up visit.                                  ASSESSMENT AND PLAN / RECOMMENDATIONS: Symptom Management/Plan: 1. ACP: DNR in [place]    2. Palliative care encounter; Palliative medicine team will continue to support patient, patient's family, and medical team. Visit consisted of counseling and education dealing with Sheila complex and emotionally intense issues of symptom management and palliative care in Sheila setting of serious and potentially life-threatening illness  3. Memory impairment worsening since covid secondary to dementia, continue supportive role, monitor progression   4. F/u 2 months ongoing monitoring cognitive decline, weights, appetite  12/21/2021 weight 173.8 lbs   Follow up Palliative Care Visit: Palliative care will continue to follow for complex medical decision making, advance care planning, and clarification of goals. Return 8 weeks or prn.  I spent 65 minutes providing this consultation. More than 50% of Sheila time in this consultation was spent in counseling and care coordination. PPS: 50%  Chief Complaint: Follow up palliative consult for complex medical decision making  HISTORY OF PRESENT ILLNESS:   Sheila Bowman is a 70 y.o. year old female  with multiple medical problems including Wernicke's Korsakoff syndrome, Alcoholic cirrhosis, htn, diabetes, alcoholic abuse, failure to thrive. Sheila Bowman continues to reside at locked memory care unit at [facility]. Sheila Bowman is fairly independent as she is able to bathe with assistance, dress herself. Sheila Bowman is able to toilet herself. Sheila Bowman does feed herself with a weight gain. Staff endorses no new changes or concerns. No recent wounds, falls, hospitalizations. I visited Sheila Bowman, she was sleeping in her bed. Sheila. Bowman awoke to verbal cues. We talked about being sleeping today. ROS, symptoms, appetite, discussed. We talked about social interactions, participating in activities at facility. We talked about her family. Sheila. Bowman talked at length about her brother, her parents home, her grandsons. We talked about daily routine though limited with cognitive impairment. Most PC visit supportive. Medical goals reviewed with staff. Will continue to follow, monitor. Attempted to contact Sheila. Bowman daughter for update on pc visit, no new recommendations for today. Sheila. Bowman appears stable.   History obtained from review of EMR, discussion with facility staff and Sheila. Goodell.  I reviewed available labs, medications, imaging, studies and related documents from Sheila EMR.  Records reviewed and summarized above.   ROS  Reviewed 10 point system with staff, all negative except HPI  Physical Exam: Constitutional: NAD General: pleasantly confused female EYES: lids intact ENMT: oral mucous membranes moist CV: S1S2, RRR Pulmonary: LCTA, no increased work of breathing, no cough, room  air Abdomen: normo-active BS + 4 quadrants, soft and non tender MSK: ambulatory Skin: warm and dry Neuro:  no generalized weakness,  + cognitive impairment Psych: non-anxious affect, A and Oriented to self Thank you for Sheila opportunity to participate in Sheila care of Sheila. Bowman.  Sheila  palliative care team will continue to follow. Please call our office at [phone] if we can be of additional assistance.   This chart was dictated using voice recognition software.  Despite best efforts to proofread,  errors can occur which can change Sheila documentation meaning.   Questions and concerns were addressed. Provided general support and encouragement, no other unmet needs identified   Joeline Freer Ihor Gully, NP

## 2022-03-07 ENCOUNTER — Encounter: Payer: Self-pay | Admitting: Nurse Practitioner

## 2022-03-07 ENCOUNTER — Non-Acute Institutional Stay: Payer: Medicare Other | Admitting: Nurse Practitioner

## 2022-03-07 ENCOUNTER — Other Ambulatory Visit: Payer: Self-pay

## 2022-03-07 DIAGNOSIS — F1096 Alcohol use, unspecified with alcohol-induced persisting amnestic disorder: Secondary | ICD-10-CM

## 2022-03-07 DIAGNOSIS — F03911 Unspecified dementia, unspecified severity, with agitation: Secondary | ICD-10-CM

## 2022-03-07 DIAGNOSIS — Z515 Encounter for palliative care: Secondary | ICD-10-CM

## 2022-03-07 NOTE — Progress Notes (Signed)
? ? ?Man[facility]uring [facility] ?Community Palliative Care Consult Note ?Telephone: [phone]  ?Fax: [phone]  ? ? ?Date of encounter: 03/07/22 ?4:09 PM ?PATIENT NAME: Sheila [place] [address]   ?[phone] (home)  ?DOB: Oct 29, 1951 ?MRN: [id number] ?PRIMARY CARE PROVIDER:    ?Sheila Limbo NP/Blakely Sheila Crane NP ? ?RESPONSIBLE PARTY:    ?Contact Information   ? ? Name Relation Home Work Mobile  ? Sheila Bowman Brother [phone]    ? Sheila Bowman Son [phone]  [phone]  ? Sheila Bowman Daughter   [phone]  ? ?  ? ?I met face to face with patient in /facility. Palliative Care was asked to follow this patient by consultation request of  Sheila Limbo, NP/Blakely Sheila Bowman to address advance care planning and complex medical decision making. This is a follow up visit.                                  ?ASSESSMENT AND PLAN / RECOMMENDATIONS:  ?Symptom Management/Plan: ?1. ACP: DNR in [place] ?  ? 2. Palliative care encounter; Palliative medicine team will continue to support patient, patient's family, and medical team. Visit consisted of counseling and education dealing with the complex and emotionally intense issues of symptom management and palliative care in the setting of serious and potentially life-threatening illness ?  ?3. agitation worsening; secondary to dementia, continue to redirect; continue supportive role, monitor progression; continue psych ? ?12/21/2021 weight 173.8 lbs ?03/07/2022 weight 179 lbs ?4.2 lbs weight gain  ?  ?4. F/u 2 months ongoing monitoring cognitive decline, weights, appetite ?  ?Follow up Palliative Care Visit: Palliative care will continue to follow for complex medical decision making, advance care planning, and clarification of goals. Return 8 weeks or prn. ? ?I spent 45 minutes providing this consultation starting at 10:45am. More than 50% of the time in this consultation was spent in counseling and care coordination. ?PPS: 50% ?Chief  Complaint: Follow up palliative consult for complex medical decision making ? ?HISTORY OF PRESENT ILLNESS:  Sheila Bowman is a 71 y.o. year old female  with multiple medical problems including Wernicke's Korsakoff syndrome, Alcoholic cirrhosis, htn, diabetes, alcoholic abuse, failure to thrive. Ms Sheila Bowman continues to reside at locked memory care unit at [facility]. Ms Sheila Bowman is requiring more assistance with bathing, dressing. Ms. Sheila Bowman does toilet herself with intermit incontinence episodes bowel and bladder per staff. Ms Sheila Bowman is able to toilet herself. Ms Sheila Bowman does feed herself with a weight gain. Staff endorses increase in irritability, agitation, not as easily redirectable. No recent wounds, falls, hospitalizations. I visited Ms Sheila Bowman, she was sleeping in her bed. Ms. Sheila Bowman awoke to verbal cues. Ms. Sheila Bowman was somewhat agitated with questions asked, answers not appropriate. Ms. Sheila Bowman has declined cognitively since last PC visit 2 months ago. Limited with cognitive impairment. Most PC visit supportive, redirected as able. Medical goals reviewed with staff. Will continue to follow, monitor. Attempted to contact Sheila Bowman daughter for update on pc visit, no new recommendations for today. Sheila Bowman appears stable.  .  ? ?History obtained from review of EMR, discussion with facility staff with  Ms. Sheila Bowman.  ?I reviewed available labs, medications, imaging, studies and related documents from the EMR.  Records reviewed and summarized above.  ? ?ROS ?10 point system reviewed with facility staff as Ms. Sheila Bowman is cognitively impaired all negative except HPI ? ?Physical Exam: ?Constitutional: NAD ?General: confused female ?EYES: lids intact ?ENMT:  oral mucous membranes moist ?CV: S1S2, RRR ?Pulmonary: LCTA, no increased work of breathing, no cough, room air ?Abdomen: normo-active BS + 4 quadrants, soft and non tender ?MSK: ambulatory ?Skin: warm and dry ?Neuro:  no generalized weakness,  + cognitive impairment ?Psych:  non-anxious affect, A and Oriented to self ?Thank you for the opportunity to participate in the care of Ms. Sheila Bowman.  The palliative care team will continue to follow. Please call our office at [phone] if we can be of additional assistance.  ? ?Sheila Bowman Z Loxley Cibrian, NP    ?

## 2022-04-27 ENCOUNTER — Non-Acute Institutional Stay: Payer: Medicare Other | Admitting: Nurse Practitioner

## 2022-04-27 ENCOUNTER — Encounter: Payer: Self-pay | Admitting: Nurse Practitioner

## 2022-04-27 DIAGNOSIS — F03911 Unspecified dementia, unspecified severity, with agitation: Secondary | ICD-10-CM

## 2022-04-27 DIAGNOSIS — Z515 Encounter for palliative care: Secondary | ICD-10-CM

## 2022-04-27 DIAGNOSIS — F1096 Alcohol use, unspecified with alcohol-induced persisting amnestic disorder: Secondary | ICD-10-CM

## 2022-04-27 NOTE — Progress Notes (Signed)
? ? ?Manufacturing engineer ?Community Palliative Care Consult Note ?Telephone: [phone]  ?Fax: [phone]  ? ? ?Date of encounter: 04/27/22 ?7:34 PM ?PATIENT NAME: Sheila Bowman ?[place] [address]   ?[phone] (home)  ?DOB: 1951/10/11 ?MRN: [id number] ?PRIMARY CARE PROVIDER:    ?[facility] ? ?RESPONSIBLE PARTY:    ?Contact Information   ? ? Name Relation Home Work Mobile  ? Dragan,Grant Brother [phone]    ? Sammuel Cooper Son [phone]  [phone]  ? Lestine Box Daughter   [phone]  ? ?  ? ?I met face to face with patient in facility.  Palliative Care was asked to follow this patient by consultation request of  [facility] to address advance care planning and complex medical decision making. This is a follow up visit.                                  ?ASSESSMENT AND PLAN / RECOMMENDATIONS:  ?Symptom Management/Plan: ?1. ACP: DNR in [place] ?  ? 2. Palliative care encounter; Palliative medicine team will continue to support patient, patient's family, and medical team. Visit consisted of counseling and education dealing with the complex and emotionally intense issues of symptom management and palliative care in the setting of serious and potentially life-threatening illness ?  ?3. Agitation worsening; secondary to dementia, continue to redirect; continue supportive role, monitor progression; Staff endorses more aggressive behaviors with other residents and staff. Redirects though at times per staff more difficult. Will recommend Psychiatry to re-evaluate will f/u with Truitt Merle NP ?  ?12/21/2021 weight 173.8 lbs ?03/07/2022 weight 179 lbs ?04/27/2022 weight 180 lbs ?  ?4. F/u 2 months ongoing monitoring cognitive decline, weights, appetitea ?Follow up Palliative Care Visit: Palliative care will continue to follow for complex medical decision making, advance care planning, and clarification of goals. Return 8 weeks or prn. ? ?I spent 44 minutes providing this  consultation. More than 50% of the time in this consultation was spent in counseling and care coordination ?PPS: 50% ? ?Chief Complaint: Follow up palliative consult for complex medical decision making ? ?HISTORY OF PRESENT ILLNESS:  Sheila Bowman is a 71 y.o. year old female  with multiple medical problems including Wernicke's Korsakoff syndrome, Alcoholic cirrhosis, htn, diabetes, alcoholic abuse, failure to thrive. Sheila Bowman continues to reside at locked memory care unit at [facility]. Sheila Bowman is requiring more assistance with bathing, dressing. Sheila. Bowman does toilet herself with intermit incontinence episodes bowel and bladder per staff. Sheila Bowman is able to toilet herself. Sheila Bowman does feed herself with a weight gain. Staff endorses increase in irritability, agitation, not as easily redirectable. No recent wounds, falls, hospitalizations. I visited Sheila Bowman, she was sleeping in her bed. Sheila. Bowman awoke to verbal cues. Sheila. Bowman was cooperative answering questions, talked about magazines in her room that she likes to look at. Sheila. Bowman talked about cooking recipes that she would like to try. Sheila. Bowman was oriented to self, delusional, though cooperative with assessment. Sheila. Bowman has declined cognitively since last PC visit 2 months ago. Limited with cognitive impairment. Most PC visit supportive, redirected as able. Medical goals reviewed with staff. Will continue to follow, monitor. Attempted to contact Sheila. Bowman daughter for update on pc visit, no new recommendations for today. Sheila. Bowman appears stable.   ?History obtained from review of EMR, discussion with facility staff and Sheila. Bowman.  ?I reviewed available  labs, medications, imaging, studies and related documents from the EMR.  Records reviewed and summarized above.  ?ROS ?10 point system reviewed with facility staff as Sheila. Bowman is cognitively impaired all negative except HPI ?  ?Physical Exam: ?Constitutional: NAD ?General: confused female ?EYES:  lids intact ?ENMT: oral mucous membranes moist ?CV: S1S2, RRR ?Pulmonary: LCTA, no increased work of breathing, no cough, room air ?Abdomen: normo-active BS + 4 quadrants, soft and non tender ?MSK: ambulatory ?Skin: warm and dry ?Neuro:  no generalized weakness,  + cognitive impairment ?Psych: non-anxious affect, A and Oriented to self ?Thank you for the opportunity to participate in the care of Sheila. Bowman.  The palliative care team will continue to follow. Please call our office at [phone] if we can be of additional assistance.  ? ?Arnel Wymer Z Shantrell Placzek, NP  ?

## 2022-06-11 ENCOUNTER — Encounter: Payer: Self-pay | Admitting: Nurse Practitioner

## 2022-06-11 ENCOUNTER — Non-Acute Institutional Stay: Payer: Medicare Other | Admitting: Nurse Practitioner

## 2022-06-11 DIAGNOSIS — Z515 Encounter for palliative care: Secondary | ICD-10-CM

## 2022-06-11 DIAGNOSIS — F1096 Alcohol use, unspecified with alcohol-induced persisting amnestic disorder: Secondary | ICD-10-CM

## 2022-06-11 DIAGNOSIS — F03911 Unspecified dementia, unspecified severity, with agitation: Secondary | ICD-10-CM

## 2022-06-11 NOTE — Progress Notes (Addendum)
Therapist, nutritional Palliative Care Consult Note Telephone: [phone]  Fax: [phone]    Date of encounter: 06/11/22 8:16 PM PATIENT NAME: Sheila Bowman   [phone] (home)  DOB: November 30, 1951 MRN: [id number] PRIMARY CARE PROVIDER:    Gwenevere Ghazi NP/[facility] Locked [facility]  RESPONSIBLE PARTY:    Contact Information     Name Relation Home Work Mobile   Sheila Bowman Brother [phone]     Sheila Bowman [phone]  [phone]   Sheila Bowman   [phone]      I met face to face with patient in facility. Palliative Care was asked to follow this patient by consultation request of  Sheila Bowman to address advance care planning and complex medical decision making. This is a follow up visit.                                  ASSESSMENT AND PLAN / RECOMMENDATIONS:  Symptom Management/Plan: 1. ACP: DNR in [place]    2. Palliative care encounter; Palliative medicine team will continue to support patient, patient's family, and medical team. Visit consisted of counseling and education dealing with the complex and emotionally intense issues of symptom management and palliative care in the setting of serious and potentially life-threatening illness   3. Agitation worsening; secondary to dementia, continue to redirect; continue supportive role, monitor progression; Psychiatry has been consulted, pending. Encourage Sheila Bowman to come out of her room, she spends most of her time in her room, engage social interactions, activities.    12/21/2021 weight 173.8 lbs 03/07/2022 weight 179 lbs 04/27/2022 weight 180 lbs 06/11/2022 weight 175.5 lbs  4. F/u 2 months ongoing monitoring cognitive decline, weights, appetitea Follow up Palliative Care Visit: Palliative care will continue to follow for complex medical decision making, advance care planning, and clarification of goals. Return  8 weeks or prn.   I spent 42 minutes providing this consultation started at 1:30 PM. More than 50% of the time in this consultation was spent in counseling and care coordination PPS: 50%   Chief Complaint: Follow up palliative consult for complex medical decision making   HISTORY OF PRESENT ILLNESS:  Sheila Bowman is a 71 y.o. year old female  with multiple medical problems including Wernicke's Korsakoff syndrome, Alcoholic cirrhosis, htn, diabetes, alcoholic abuse, failure to thrive. Sheila Bowman continues to reside at locked memory care unit at [facility]. Sheila Bowman is requiring more assistance with bathing, dressing. Sheila. Bowman does toilet herself with intermit incontinence episodes bowel and bladder per staff. Sheila Bowman is able to toilet herself. Sheila Bowman does feed herself with a weight gain. Staff endorses continues with increase in irritability, agitation, not as easily redirectable. Psychiatry consult pending. I visited Sheila Bowman, she was sleeping in her bed. Sheila Bowman awoke to verbal cues. Sheila Bowman talked rambled, ruminated and repeated same words, no meaningful discussion due to cognitive impairment.  Sheila Bowman has continued to declined cognitively since last PC visit 2 months ago. Limited with cognitive impairment. Most PC visit supportive, redirected as able. Medical goals reviewed with staff. Will continue to follow, monitor. Attempted to contact Sheila Bowman daughter for update on pc visit, no new recommendations for today. Sheila Bowman appears stable.   History obtained from review of EMR, discussion with facility staff and Sheila Bowman.  I reviewed available labs, medications, imaging, studies and related documents  from the EMR.  Records reviewed and summarized above.  ROS 10 point system reviewed with facility staff as Sheila Bowman is cognitively impaired all negative except HPI   Physical Exam: Constitutional: NAD General: confused female EYES: lids intact ENMT: oral mucous membranes moist CV:  S1S2, RRR Pulmonary: LCTA, no increased work of breathing, no cough, room air Abdomen: normo-active BS + 4 quadrants, soft and non tender MSK: ambulatory Skin: warm and dry Neuro:  no generalized weakness,  + cognitive impairment Psych: non-anxious affect, A and Oriented to self Thank you for the opportunity to participate in the care of Sheila Bowman.  The palliative care team will continue to follow. Please call our office at [phone] if we can be of additional assistance.   Sheila Bowman Sheila Gully, NP

## 2022-06-21 ENCOUNTER — Other Ambulatory Visit: Payer: Self-pay

## 2022-06-21 ENCOUNTER — Encounter: Payer: Self-pay | Admitting: Internal Medicine

## 2022-06-21 ENCOUNTER — Emergency Department: Payer: Medicare Other

## 2022-06-21 ENCOUNTER — Observation Stay
Admission: EM | Admit: 2022-06-21 | Discharge: 2022-06-22 | Disposition: A | Discharge status: Skilled Nursing Facility | Payer: Medicare Other | Attending: Internal Medicine | Admitting: Internal Medicine

## 2022-06-21 DIAGNOSIS — K7460 Unspecified cirrhosis of liver: Secondary | ICD-10-CM | POA: Insufficient documentation

## 2022-06-21 DIAGNOSIS — N179 Acute kidney failure, unspecified: Secondary | ICD-10-CM | POA: Diagnosis not present

## 2022-06-21 DIAGNOSIS — E512 Wernicke's encephalopathy: Secondary | ICD-10-CM | POA: Diagnosis not present

## 2022-06-21 DIAGNOSIS — E860 Dehydration: Secondary | ICD-10-CM | POA: Insufficient documentation

## 2022-06-21 DIAGNOSIS — R4182 Altered mental status, unspecified: Secondary | ICD-10-CM | POA: Diagnosis present

## 2022-06-21 DIAGNOSIS — G9341 Metabolic encephalopathy: Secondary | ICD-10-CM | POA: Diagnosis not present

## 2022-06-21 DIAGNOSIS — D649 Anemia, unspecified: Secondary | ICD-10-CM | POA: Diagnosis present

## 2022-06-21 DIAGNOSIS — F0390 Unspecified dementia without behavioral disturbance: Secondary | ICD-10-CM | POA: Diagnosis not present

## 2022-06-21 DIAGNOSIS — E7220 Disorder of urea cycle metabolism, unspecified: Secondary | ICD-10-CM | POA: Insufficient documentation

## 2022-06-21 DIAGNOSIS — F1096 Alcohol use, unspecified with alcohol-induced persisting amnestic disorder: Secondary | ICD-10-CM | POA: Diagnosis present

## 2022-06-21 DIAGNOSIS — D539 Nutritional anemia, unspecified: Secondary | ICD-10-CM | POA: Diagnosis present

## 2022-06-21 LAB — CBC WITH DIFFERENTIAL/PLATELET
Abs Immature Granulocytes: 0.02 10*3/uL (ref 0.00–0.07)
Basophils Absolute: 0.1 10*3/uL (ref 0.0–0.1)
Basophils Relative: 1 %
Eosinophils Absolute: 0.2 10*3/uL (ref 0.0–0.5)
Eosinophils Relative: 3 %
HCT: 32.7 % — ABNORMAL LOW (ref 36.0–46.0)
Hemoglobin: 10.9 g/dL — ABNORMAL LOW (ref 12.0–15.0)
Immature Granulocytes: 0 %
Lymphocytes Relative: 22 %
Lymphs Abs: 1.4 10*3/uL (ref 0.7–4.0)
MCH: 32.8 pg (ref 26.0–34.0)
MCHC: 33.3 g/dL (ref 30.0–36.0)
MCV: 98.5 fL (ref 80.0–100.0)
Monocytes Absolute: 0.8 10*3/uL (ref 0.1–1.0)
Monocytes Relative: 13 %
Neutro Abs: 3.8 10*3/uL (ref 1.7–7.7)
Neutrophils Relative %: 61 %
Platelets: 118 10*3/uL — ABNORMAL LOW (ref 150–400)
RBC: 3.32 MIL/uL — ABNORMAL LOW (ref 3.87–5.11)
RDW: 12.1 % (ref 11.5–15.5)
WBC: 6.2 10*3/uL (ref 4.0–10.5)
nRBC: 0 % (ref 0.0–0.2)

## 2022-06-21 LAB — TROPONIN I (HIGH SENSITIVITY)
Troponin I (High Sensitivity): 14 ng/L (ref <18)
Troponin I (High Sensitivity): 13 ng/L (ref <18)

## 2022-06-21 LAB — COMPREHENSIVE METABOLIC PANEL
ALT: 21 U/L (ref 0–44)
AST: 39 U/L (ref 15–41)
Albumin: 3.9 g/dL (ref 3.5–5.0)
Alkaline Phosphatase: 64 U/L (ref 38–126)
Anion gap: 7 (ref 5–15)
BUN: 19 mg/dL (ref 8–23)
CO2: 20 mmol/L — ABNORMAL LOW (ref 22–32)
Calcium: 10 mg/dL (ref 8.9–10.3)
Chloride: 112 mmol/L — ABNORMAL HIGH (ref 98–111)
Creatinine, Ser: 1.21 mg/dL — ABNORMAL HIGH (ref 0.44–1.00)
GFR, Estimated: 48 mL/min — ABNORMAL LOW (ref >60)
Glucose, Bld: 114 mg/dL — ABNORMAL HIGH (ref 70–99)
Potassium: 4 mmol/L (ref 3.5–5.1)
Sodium: 139 mmol/L (ref 135–145)
Total Bilirubin: 1.6 mg/dL — ABNORMAL HIGH (ref 0.3–1.2)
Total Protein: 7.4 g/dL (ref 6.5–8.1)

## 2022-06-21 LAB — URINALYSIS, ROUTINE W REFLEX MICROSCOPIC
Bilirubin Urine: NEGATIVE
Glucose, UA: NEGATIVE mg/dL
Hgb urine dipstick: NEGATIVE
Ketones, ur: NEGATIVE mg/dL
Leukocytes,Ua: NEGATIVE
Nitrite: NEGATIVE
Protein, ur: NEGATIVE mg/dL
Specific Gravity, Urine: 1.015 (ref 1.005–1.030)
pH: 5 (ref 5.0–8.0)

## 2022-06-21 LAB — LIPASE, BLOOD: Lipase: 45 U/L (ref 11–51)

## 2022-06-21 LAB — AMMONIA: Ammonia: 58 umol/L — ABNORMAL HIGH (ref 9–35)

## 2022-06-21 MED ORDER — ACETAMINOPHEN 325 MG PO TABS: 650.0000 mg | ORAL_TABLET | Freq: Four times a day (QID) | ORAL | Status: DC | PRN

## 2022-06-21 MED ORDER — FOLIC ACID 1 MG PO TABS
1000.0000 ug | ORAL_TABLET | Freq: Every day | ORAL | Status: DC
  Administered 2022-06-22: 1 mg via ORAL
  Filled 2022-06-21: qty 1

## 2022-06-21 MED ORDER — ENSURE ENLIVE PO LIQD
237.0000 mL | Freq: Two times a day (BID) | ORAL | Status: DC
  Administered 2022-06-22: 237 mL via ORAL

## 2022-06-21 MED ORDER — ADULT MULTIVITAMIN W/MINERALS CH
1.0000 | ORAL_TABLET | Freq: Every day | ORAL | Status: DC
  Administered 2022-06-22: 1 via ORAL
  Filled 2022-06-21: qty 1

## 2022-06-21 MED ORDER — SODIUM CHLORIDE 0.9 % IV BOLUS
1000.0000 mL | Freq: Once | INTRAVENOUS | Status: AC
  Administered 2022-06-21: 1000 mL via INTRAVENOUS

## 2022-06-21 MED ORDER — AMLODIPINE BESYLATE 5 MG PO TABS
5.0000 mg | ORAL_TABLET | Freq: Every day | ORAL | Status: DC
  Administered 2022-06-22: 5 mg via ORAL
  Filled 2022-06-21: qty 1

## 2022-06-21 MED ORDER — ONDANSETRON HCL 4 MG/2ML IJ SOLN: 4.0000 mg | Freq: Four times a day (QID) | INTRAMUSCULAR | Status: DC | PRN

## 2022-06-21 MED ORDER — ENOXAPARIN SODIUM 40 MG/0.4ML IJ SOSY
40.0000 mg | PREFILLED_SYRINGE | INTRAMUSCULAR | Status: DC
  Administered 2022-06-21: 40 mg via SUBCUTANEOUS
  Filled 2022-06-21: qty 0.4

## 2022-06-21 MED ORDER — FERROUS SULFATE 325 (65 FE) MG PO TABS
325.0000 mg | ORAL_TABLET | Freq: Every day | ORAL | Status: DC
  Administered 2022-06-22: 325 mg via ORAL
  Filled 2022-06-21: qty 1

## 2022-06-21 MED ORDER — LACTULOSE 10 GM/15ML PO SOLN
10.0000 g | Freq: Three times a day (TID) | ORAL | Status: DC
  Administered 2022-06-21 – 2022-06-22 (×3): 10 g via ORAL
  Filled 2022-06-21 (×3): qty 30

## 2022-06-21 MED ORDER — SERTRALINE HCL 50 MG PO TABS
50.0000 mg | ORAL_TABLET | Freq: Every day | ORAL | Status: DC
  Administered 2022-06-22: 50 mg via ORAL
  Filled 2022-06-21: qty 1

## 2022-06-21 MED ORDER — ONDANSETRON HCL 4 MG PO TABS: 4.0000 mg | ORAL_TABLET | Freq: Four times a day (QID) | ORAL | Status: DC | PRN

## 2022-06-21 MED ORDER — LORAZEPAM 0.5 MG PO TABS
0.2500 mg | ORAL_TABLET | Freq: Two times a day (BID) | ORAL | Status: DC
  Administered 2022-06-21 – 2022-06-22 (×2): 0.25 mg via ORAL
  Filled 2022-06-21 (×2): qty 1

## 2022-06-21 MED ORDER — PROGESTERONE MICRONIZED 100 MG PO CAPS
200.0000 mg | ORAL_CAPSULE | Freq: Every day | ORAL | Status: DC
  Administered 2022-06-21: 200 mg via ORAL
  Filled 2022-06-21: qty 2

## 2022-06-21 MED ORDER — ACETAMINOPHEN 650 MG RE SUPP: 650.0000 mg | Freq: Four times a day (QID) | RECTAL | Status: DC | PRN

## 2022-06-21 MED ORDER — SODIUM CHLORIDE 0.9 % IV SOLN: INTRAVENOUS | Status: AC

## 2022-06-21 MED ORDER — THIAMINE HCL 100 MG PO TABS
50.0000 mg | ORAL_TABLET | Freq: Every day | ORAL | Status: DC
  Administered 2022-06-22: 50 mg via ORAL
  Filled 2022-06-21: qty 1

## 2022-06-21 MED ORDER — LACTULOSE 10 GM/15ML PO SOLN
10.0000 g | Freq: Two times a day (BID) | ORAL | Status: DC
  Administered 2022-06-21: 10 g via ORAL
  Filled 2022-06-21: qty 30

## 2022-06-21 NOTE — ED Notes (Signed)
Pt up with 1 person assist to the bathroom to have BM

## 2022-06-21 NOTE — ED Notes (Signed)
Informed RN bed assigned 

## 2022-06-21 NOTE — Progress Notes (Signed)
Admission profile updated. ?

## 2022-06-21 NOTE — Assessment & Plan Note (Signed)
Patient has a known history of alcoholic liver cirrhosis and has thrombocytopenia related to same. Monitor platelet count closely during this hospitalization Hold spironolactone until renal function improves

## 2022-06-21 NOTE — ED Triage Notes (Signed)
Pt sent from [facility] d/t high amonia levels and abnormal kidney function labs. Pt has hx of alcohol abuse

## 2022-06-21 NOTE — Assessment & Plan Note (Signed)
Related to history of alcohol abuse Continue Zoloft and lorazepam

## 2022-06-21 NOTE — Assessment & Plan Note (Signed)
Patient presents for skilled nursing facility where she resides for evaluation of mental status changes described as being repetitive and unable to carry out meaningful conversation. Per chart review this appears to be her baseline Patient noted to have elevated ammonia levels but is awake and alert Continue lactulose

## 2022-06-21 NOTE — ED Provider Notes (Signed)
[facility] Provider Note    None    (approximate)   History  Chief complaint is altered mental status  HPI  Sheila Bowman is a 71 y.o. female who was sent by her doctor for altered mental status and an ammonia level that was elevated on the 20th. EMS reports that she walks normally had a little bit of confusion and possibly slurry speech but was otherwise okay.     Physical Exam   Triage Vital Signs: ED Triage Vitals  Enc Vitals Group     BP      Pulse      Resp      Temp      Temp src      SpO2      Weight      Height      Head Circumference      Peak Flow      Pain Score      Pain Loc      Pain Edu?      Excl. in GC?     Most recent vital signs: Vitals:   06/21/22 1105 06/21/22 1148  BP: 129/66 (!) 94/50  Pulse: 63 64  Resp: 17 16  Temp:    SpO2: 100% 100%     General: Awake, no distress.  Head normocephalic atraumatic Eyes pupils equal round reactive extraocular movements intact CV:  Good peripheral perfusion.  Heart regular rate and rhythm no audible murmur Resp:  Normal effort.  Lungs are clear Abd:  No distention.  Soft nontender Extremities: No edema Neuro exam: Patient is alert and oriented but very slow in her thinking.  She has difficulty following more than 1 idea at a time.  For instance she can tell me her address but it takes her about 2 minutes to go through the whole thing.  She cannot tell me the day or date however.  When asked the date she just says 3 Cerebellar finger-nose is very slow but not ataxic motor strength is 5/5 throughout  ED Results / Procedures / Treatments   Labs (all labs ordered are listed, but only abnormal results are displayed) Labs Reviewed  AMMONIA - Abnormal; Notable for the following components:      Result Value   Ammonia 58 (*)    All other components within normal limits  COMPREHENSIVE METABOLIC PANEL - Abnormal; Notable for the following components:   Chloride 112 (*)     CO2 20 (*)    Glucose, Bld 114 (*)    Creatinine, Ser 1.21 (*)    Total Bilirubin 1.6 (*)    GFR, Estimated 48 (*)    All other components within normal limits  CBC WITH DIFFERENTIAL/PLATELET - Abnormal; Notable for the following components:   RBC 3.32 (*)    Hemoglobin 10.9 (*)    HCT 32.7 (*)    Platelets 118 (*)    All other components within normal limits  URINALYSIS, ROUTINE W REFLEX MICROSCOPIC - Abnormal; Notable for the following components:   Color, Urine YELLOW (*)    APPearance HAZY (*)    All other components within normal limits  LIPASE, BLOOD  TROPONIN I (HIGH SENSITIVITY)  TROPONIN I (HIGH SENSITIVITY)     EKG  EKG read and interpreted by me shows normal sinus rhythm rate of 65 normal axis no acute ST-T wave change Heart monitor at times drops into the 50s sinus bradycardia.   RADIOLOGY    PROCEDURES:  Critical Care  performed:   Procedures   MEDICATIONS ORDERED IN ED: Medications  lactulose (CHRONULAC) 10 GM/15ML solution 10 g (10 g Oral Given 06/21/22 1148)  sodium chloride 0.9 % bolus 1,000 mL (1,000 mLs Intravenous New Bag/Given 06/21/22 1140)     IMPRESSION / MDM / ASSESSMENT AND PLAN / ED COURSE  I reviewed the triage vital signs and the nursing notes.   Differential diagnosis includes, but is not limited to, sepsis or renal failure from any 1 of a number of problems or liver failure or noncompliance with medications which is the most likely and not drinking.  Patient's presentation is most consistent with acute presentation with potential threat to life or bodily function.  The patient is on the cardiac monitor to evaluate for evidence of arrhythmia and/or significant heart rate changes.  None were seen   FINAL CLINICAL IMPRESSION(S) / ED DIAGNOSES   Final diagnoses:  Altered mental status, unspecified altered mental status type  Hyperammonemia (HCC)  AKI (acute kidney injury) (HCC)     Rx / DC Orders   ED Discharge Orders      None        Note:  This document was prepared using Dragon voice recognition software and may include unintentional dictation errors.   Arnaldo Natal, MD 06/21/22 1257

## 2022-06-21 NOTE — Assessment & Plan Note (Signed)
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

## 2022-06-22 DIAGNOSIS — G9341 Metabolic encephalopathy: Secondary | ICD-10-CM | POA: Diagnosis not present

## 2022-06-22 LAB — CBC
HCT: 29.6 % — ABNORMAL LOW (ref 36.0–46.0)
Hemoglobin: 10 g/dL — ABNORMAL LOW (ref 12.0–15.0)
MCH: 33 pg (ref 26.0–34.0)
MCHC: 33.8 g/dL (ref 30.0–36.0)
MCV: 97.7 fL (ref 80.0–100.0)
Platelets: 105 10*3/uL — ABNORMAL LOW (ref 150–400)
RBC: 3.03 MIL/uL — ABNORMAL LOW (ref 3.87–5.11)
RDW: 12.1 % (ref 11.5–15.5)
WBC: 5.1 10*3/uL (ref 4.0–10.5)
nRBC: 0 % (ref 0.0–0.2)

## 2022-06-22 LAB — BASIC METABOLIC PANEL
Anion gap: 3 — ABNORMAL LOW (ref 5–15)
BUN: 15 mg/dL (ref 8–23)
CO2: 19 mmol/L — ABNORMAL LOW (ref 22–32)
Calcium: 8.9 mg/dL (ref 8.9–10.3)
Chloride: 120 mmol/L — ABNORMAL HIGH (ref 98–111)
Creatinine, Ser: 1.03 mg/dL — ABNORMAL HIGH (ref 0.44–1.00)
GFR, Estimated: 58 mL/min — ABNORMAL LOW (ref >60)
Glucose, Bld: 106 mg/dL — ABNORMAL HIGH (ref 70–99)
Potassium: 4.6 mmol/L (ref 3.5–5.1)
Sodium: 142 mmol/L (ref 135–145)

## 2022-06-22 MED ORDER — SPIRONOLACTONE 25 MG PO TABS: 12.5000 mg | ORAL_TABLET | Freq: Every day | ORAL | 0 refills | Status: DC

## 2022-06-22 NOTE — Progress Notes (Signed)
Report called [phone]

## 2022-06-22 NOTE — NC FL2 (Signed)
[place] MEDICAID FL2 LEVEL OF CARE SCREENING TOOL     IDENTIFICATION  Patient Name: Sheila Bowman Birthdate: [birth year] Sex: female Admission Date (Current Location): 06/21/2022  [facility] and [place] Number:  Chiropodist and Address:  [facility], [address], [place], [place] [address]      Provider Number: [id number]  Attending Physician Name and Address:  Darlin Drop, DO  Relative Name and Phone Number:  [employer])   [phone] 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   Normocytic anemia    Alcohol abuse    Cirrhosis (HCC)    Goals of care, counseling/discussion    DNR (do not resuscitate)    Palliative care by specialist    Failure to thrive in adult    Acute metabolic encephalopathy 02/18/2020   Alcoholic cirrhosis of liver without ascites (HCC) 02/18/2020   Alcohol abuse with alcohol-induced disorder (HCC) 02/18/2020   Hypokalemia 02/18/2020   Type 2 diabetes mellitus without complication (HCC) 02/18/2020   Essential hypertension 02/18/2020    Orientation RESPIRATION BLADDER Height & Weight     Self, Place  Normal Incontinent Weight: 165 lb (74.8 kg) Height:  5\' 5"  (165.1 cm)  BEHAVIORAL SYMPTOMS/MOOD NEUROLOGICAL BOWEL NUTRITION STATUS      Continent Diet (heart healthy/carb modified)  AMBULATORY STATUS COMMUNICATION OF NEEDS Skin   Limited Assist Verbally Bruising                       Personal Care Assistance  Level of Assistance  Bathing, Feeding, Dressing Bathing Assistance: Limited assistance Feeding assistance: Limited assistance Dressing Assistance: Limited assistance     Functional Limitations Info             SPECIAL CARE FACTORS FREQUENCY                       Contractures      Additional Factors Info  Code Status, Allergies Code Status Info: DNR Allergies Info: nka           Medication List       STOP taking these medications     busPIRone 5 MG tablet Commonly known as: BUSPAR    feeding supplement Liqd    olmesartan 40 MG tablet Commonly known as: BENICAR           TAKE these medications     amLODipine 5 MG tablet Commonly known as: NORVASC Take 5 mg by mouth daily.    ferrous sulfate 325 (65 FE) MG tablet Take 325 mg by mouth daily with breakfast.    folic acid 400 MCG tablet Commonly known as: FOLVITE Take 400 mcg by mouth daily.    lactulose 10 GM/15ML solution Commonly known as: CHRONULAC Take 15 mLs by mouth in the morning and at bedtime.    loratadine 10 MG tablet Commonly known as: CLARITIN Take 10 mg by mouth daily.    LORazepam 0.5 MG  tablet Commonly known as: ATIVAN Take 0.25 mg by mouth 2 (two) times daily.    multivitamin with minerals Tabs tablet Take 1 tablet by mouth daily.    progesterone 200 MG capsule Commonly known as: PROMETRIUM Take 200 mg by mouth at bedtime.    sertraline 50 MG tablet Commonly known as: ZOLOFT Take 50 mg by mouth daily.    spironolactone 25 MG tablet Commonly known as: ALDACTONE Take 0.5 tablets (12.5 mg total) by mouth daily. What changed: how much to take    thiamine 100 MG tablet Take 50 mg by mouth daily.       Relevant Imaging Results:  Relevant Lab Results:   Additional Information SS #: [id number]  Stalin Gruenberg E Westlyn Glaza, LCSW

## 2022-08-06 ENCOUNTER — Other Ambulatory Visit: Payer: Self-pay

## 2022-08-06 ENCOUNTER — Emergency Department
Admission: EM | Admit: 2022-08-06 | Discharge: 2022-08-06 | Disposition: A | Discharge status: Skilled Nursing Facility | Payer: Medicare Other | Source: Home / Self Care | Attending: Emergency Medicine | Admitting: Emergency Medicine

## 2022-08-06 DIAGNOSIS — D649 Anemia, unspecified: Secondary | ICD-10-CM | POA: Insufficient documentation

## 2022-08-06 DIAGNOSIS — N179 Acute kidney failure, unspecified: Secondary | ICD-10-CM | POA: Diagnosis not present

## 2022-08-06 DIAGNOSIS — E7220 Disorder of urea cycle metabolism, unspecified: Secondary | ICD-10-CM

## 2022-08-06 DIAGNOSIS — K7682 Hepatic encephalopathy: Secondary | ICD-10-CM | POA: Diagnosis not present

## 2022-08-06 DIAGNOSIS — F0390 Unspecified dementia without behavioral disturbance: Secondary | ICD-10-CM | POA: Insufficient documentation

## 2022-08-06 LAB — CBC WITH DIFFERENTIAL/PLATELET
Abs Immature Granulocytes: 0.01 10*3/uL (ref 0.00–0.07)
Basophils Absolute: 0 10*3/uL (ref 0.0–0.1)
Basophils Relative: 1 %
Eosinophils Absolute: 0.2 10*3/uL (ref 0.0–0.5)
Eosinophils Relative: 5 %
HCT: 31.8 % — ABNORMAL LOW (ref 36.0–46.0)
Hemoglobin: 10.2 g/dL — ABNORMAL LOW (ref 12.0–15.0)
Immature Granulocytes: 0 %
Lymphocytes Relative: 30 %
Lymphs Abs: 1.5 10*3/uL (ref 0.7–4.0)
MCH: 32.5 pg (ref 26.0–34.0)
MCHC: 32.1 g/dL (ref 30.0–36.0)
MCV: 101.3 fL — ABNORMAL HIGH (ref 80.0–100.0)
Monocytes Absolute: 0.8 10*3/uL (ref 0.1–1.0)
Monocytes Relative: 15 %
Neutro Abs: 2.5 10*3/uL (ref 1.7–7.7)
Neutrophils Relative %: 49 %
Platelets: 103 10*3/uL — ABNORMAL LOW (ref 150–400)
RBC: 3.14 MIL/uL — ABNORMAL LOW (ref 3.87–5.11)
RDW: 12.3 % (ref 11.5–15.5)
WBC: 5 10*3/uL (ref 4.0–10.5)
nRBC: 0 % (ref 0.0–0.2)

## 2022-08-06 LAB — AMMONIA: Ammonia: 43 umol/L — ABNORMAL HIGH (ref 9–35)

## 2022-08-06 LAB — COMPREHENSIVE METABOLIC PANEL
ALT: 20 U/L (ref 0–44)
AST: 42 U/L — ABNORMAL HIGH (ref 15–41)
Albumin: 4 g/dL (ref 3.5–5.0)
Alkaline Phosphatase: 67 U/L (ref 38–126)
Anion gap: 7 (ref 5–15)
BUN: 24 mg/dL — ABNORMAL HIGH (ref 8–23)
CO2: 18 mmol/L — ABNORMAL LOW (ref 22–32)
Calcium: 9.9 mg/dL (ref 8.9–10.3)
Chloride: 120 mmol/L — ABNORMAL HIGH (ref 98–111)
Creatinine, Ser: 1.13 mg/dL — ABNORMAL HIGH (ref 0.44–1.00)
GFR, Estimated: 52 mL/min — ABNORMAL LOW (ref >60)
Glucose, Bld: 105 mg/dL — ABNORMAL HIGH (ref 70–99)
Potassium: 4.7 mmol/L (ref 3.5–5.1)
Sodium: 145 mmol/L (ref 135–145)
Total Bilirubin: 2.1 mg/dL — ABNORMAL HIGH (ref 0.3–1.2)
Total Protein: 7.5 g/dL (ref 6.5–8.1)

## 2022-08-06 NOTE — ED Triage Notes (Signed)
Pt from facility states her ammonia level was elevated yesterday sent for evaluation. Pt per EMS at her baseline alert to self.

## 2022-08-06 NOTE — ED Notes (Signed)
[facility]  CALLED  FOR TRANSPORT TO  [facility] 

## 2022-08-06 NOTE — Discharge Instructions (Addendum)
Your ammonia level today has decreased from 102 to 42. Please continue taking lactulose as prescribed

## 2022-08-06 NOTE — ED Provider Notes (Signed)
[facility] Provider Note   Event Date/Time   First MD Initiated Contact with Patient 08/06/22 [phone]     (approximate) History  No chief complaint on file.  HPI Sheila Bowman is a 71 y.o. female with a past history of dementia who presents from her long-term care facility with concerns for elevated ammonia.  In discussion with her long-term care facility, they reported a ammonia level of 102 from labs that were taken yesterday.  They did not notice any change in patient's mental status or decreased bowel movements.  They do endorse patient taking her lactulose on time and as prescribed.  Patient is only oriented to self at baseline and is currently.  Further history and review of systems are unable to be obtained at this time.   Physical Exam  Triage Vital Signs: ED Triage Vitals  Enc Vitals Group     BP 08/06/22 0948 126/71     Pulse Rate 08/06/22 0948 75     Resp 08/06/22 0948 19     Temp 08/06/22 0948 97.8 F (36.6 C)     Temp Source 08/06/22 0948 Oral     SpO2 08/06/22 0948 99 %     Weight 08/06/22 0950 163 lb 5.8 oz (74.1 kg)     Height 08/06/22 0950 5\' 5"  (1.651 m)     Head Circumference --      Peak Flow --      Pain Score 08/06/22 0949 0     Pain Loc --      Pain Edu? --      Excl. in GC? --    Most recent vital signs: Vitals:   08/06/22 1000 08/06/22 1030  BP: 98/64 (!) 117/59  Pulse: 66 64  Resp: 15 14  Temp:    SpO2: 100% 99%   General: Awake, cooperative CV:  Good peripheral perfusion.  Resp:  Normal effort.  Abd:  No distention.  Other:  Elderly Caucasian female laying in bed in no acute distress. ED Results / Procedures / Treatments  Labs (all labs ordered are listed, but only abnormal results are displayed) Labs Reviewed  COMPREHENSIVE METABOLIC PANEL - Abnormal; Notable for the following components:      Result Value   Chloride 120 (*)    CO2 18 (*)    Glucose, Bld 105 (*)    BUN 24 (*)    Creatinine, Ser 1.13 (*)     AST 42 (*)    Total Bilirubin 2.1 (*)    GFR, Estimated 52 (*)    All other components within normal limits  CBC WITH DIFFERENTIAL/PLATELET - Abnormal; Notable for the following components:   RBC 3.14 (*)    Hemoglobin 10.2 (*)    HCT 31.8 (*)    MCV 101.3 (*)    Platelets 103 (*)    All other components within normal limits  AMMONIA - Abnormal; Notable for the following components:   Ammonia 43 (*)    All other components within normal limits  PROCEDURES: Critical Care performed: No .1-3 Lead EKG Interpretation  Performed by: 10/06/22, MD Authorized by: Merwyn Katos, MD     Interpretation: normal     ECG rate:  66   ECG rate assessment: normal     Rhythm: sinus rhythm     Ectopy: none     Conduction: normal    MEDICATIONS ORDERED IN ED: Medications - No data to display IMPRESSION / MDM / ASSESSMENT AND PLAN /  ED COURSE  I reviewed the triage vital signs and the nursing notes.                             Differential diagnosis includes, but is not limited to, hepatic encephalopathy, acute renal failure, delirium The patient is on the cardiac monitor to evaluate for evidence of arrhythmia and/or significant heart rate changes. Patient's presentation is most consistent with acute presentation with potential threat to life or bodily function. Patient is a 71 year old female with the above-stated past medical history who presents with concerns for elevated ammonia levels.  Patient's repeat ammonia level here today was 43.  Other laboratory abnormalities that were noted by long-term care facility staff including a 3 a creatinine that was elevated to 1.66 has now resolved as well with a creatinine of 1.13.  Patient does have stable anemia with hemoglobin of 10.2 and hematocrit 31.8 which is similar to her baseline anemia that looks to be approximately hemoglobin 10/hematocrit 30.  Patient is at her neurologic baseline per long-term care facility staff and there are no concerns  for any recent trauma/falls or loss of consciousness. Encouraged staff to continue patient's lactulose on time and as prescribed and monitor for any decreased bowel movements. Dispo: Discharged to long-term care facility Clinical Course as of 08/06/22 1106  Mon Aug 06, 2022  0949 Ammonia-102 per staff at [facility] [EB]    Clinical Course User Index [EB] Vicente Males, Clent Jacks, MD   FINAL CLINICAL IMPRESSION(S) / ED DIAGNOSES   Final diagnoses:  Hyperammonemia (HCC)   Rx / DC Orders   ED Discharge Orders     None      Note:  This document was prepared using Dragon voice recognition software and may include unintentional dictation errors.   Merwyn Katos, MD 08/06/22 1106

## 2022-08-06 NOTE — ED Notes (Signed)
Pt alert, IV removed, discharge instructions given to EMS, report given to [facility].

## 2022-08-09 ENCOUNTER — Inpatient Hospital Stay
Admission: EM | Admit: 2022-08-09 | Discharge: 2022-08-12 | DRG: 441 | Disposition: A | Discharge status: Hospice | Payer: Medicare Other | Attending: Family Medicine | Admitting: Family Medicine

## 2022-08-09 ENCOUNTER — Emergency Department: Payer: Medicare Other

## 2022-08-09 ENCOUNTER — Other Ambulatory Visit: Payer: Self-pay

## 2022-08-09 DIAGNOSIS — Z8249 Family history of ischemic heart disease and other diseases of the circulatory system: Secondary | ICD-10-CM

## 2022-08-09 DIAGNOSIS — E860 Dehydration: Secondary | ICD-10-CM | POA: Diagnosis present

## 2022-08-09 DIAGNOSIS — D631 Anemia in chronic kidney disease: Secondary | ICD-10-CM | POA: Diagnosis present

## 2022-08-09 DIAGNOSIS — Z87891 Personal history of nicotine dependence: Secondary | ICD-10-CM | POA: Diagnosis not present

## 2022-08-09 DIAGNOSIS — I129 Hypertensive chronic kidney disease with stage 1 through stage 4 chronic kidney disease, or unspecified chronic kidney disease: Secondary | ICD-10-CM | POA: Diagnosis present

## 2022-08-09 DIAGNOSIS — I10 Essential (primary) hypertension: Secondary | ICD-10-CM | POA: Diagnosis not present

## 2022-08-09 DIAGNOSIS — K7030 Alcoholic cirrhosis of liver without ascites: Secondary | ICD-10-CM | POA: Diagnosis present

## 2022-08-09 DIAGNOSIS — N1831 Chronic kidney disease, stage 3a: Secondary | ICD-10-CM | POA: Diagnosis present

## 2022-08-09 DIAGNOSIS — D539 Nutritional anemia, unspecified: Secondary | ICD-10-CM | POA: Diagnosis present

## 2022-08-09 DIAGNOSIS — Z6827 Body mass index (BMI) 27.0-27.9, adult: Secondary | ICD-10-CM | POA: Diagnosis not present

## 2022-08-09 DIAGNOSIS — E663 Overweight: Secondary | ICD-10-CM | POA: Diagnosis present

## 2022-08-09 DIAGNOSIS — Z79899 Other long term (current) drug therapy: Secondary | ICD-10-CM

## 2022-08-09 DIAGNOSIS — K7682 Hepatic encephalopathy: Secondary | ICD-10-CM | POA: Diagnosis not present

## 2022-08-09 DIAGNOSIS — E7220 Disorder of urea cycle metabolism, unspecified: Secondary | ICD-10-CM

## 2022-08-09 DIAGNOSIS — E1122 Type 2 diabetes mellitus with diabetic chronic kidney disease: Secondary | ICD-10-CM | POA: Diagnosis present

## 2022-08-09 DIAGNOSIS — N179 Acute kidney failure, unspecified: Secondary | ICD-10-CM | POA: Diagnosis not present

## 2022-08-09 DIAGNOSIS — Z515 Encounter for palliative care: Secondary | ICD-10-CM

## 2022-08-09 DIAGNOSIS — R451 Restlessness and agitation: Secondary | ICD-10-CM | POA: Diagnosis present

## 2022-08-09 DIAGNOSIS — F1097 Alcohol use, unspecified with alcohol-induced persisting dementia: Secondary | ICD-10-CM | POA: Diagnosis present

## 2022-08-09 DIAGNOSIS — R4182 Altered mental status, unspecified: Secondary | ICD-10-CM

## 2022-08-09 DIAGNOSIS — F0390 Unspecified dementia without behavioral disturbance: Secondary | ICD-10-CM | POA: Diagnosis present

## 2022-08-09 DIAGNOSIS — F0394 Unspecified dementia, unspecified severity, with anxiety: Secondary | ICD-10-CM | POA: Diagnosis not present

## 2022-08-09 DIAGNOSIS — G9341 Metabolic encephalopathy: Secondary | ICD-10-CM | POA: Diagnosis present

## 2022-08-09 DIAGNOSIS — Z66 Do not resuscitate: Secondary | ICD-10-CM | POA: Diagnosis present

## 2022-08-09 DIAGNOSIS — E119 Type 2 diabetes mellitus without complications: Secondary | ICD-10-CM

## 2022-08-09 LAB — CBC WITH DIFFERENTIAL/PLATELET
Abs Immature Granulocytes: 0.03 10*3/uL (ref 0.00–0.07)
Basophils Absolute: 0 10*3/uL (ref 0.0–0.1)
Basophils Relative: 1 %
Eosinophils Absolute: 0.1 10*3/uL (ref 0.0–0.5)
Eosinophils Relative: 2 %
HCT: 32.3 % — ABNORMAL LOW (ref 36.0–46.0)
Hemoglobin: 10.6 g/dL — ABNORMAL LOW (ref 12.0–15.0)
Immature Granulocytes: 0 %
Lymphocytes Relative: 18 %
Lymphs Abs: 1.3 10*3/uL (ref 0.7–4.0)
MCH: 32.8 pg (ref 26.0–34.0)
MCHC: 32.8 g/dL (ref 30.0–36.0)
MCV: 100 fL (ref 80.0–100.0)
Monocytes Absolute: 0.8 10*3/uL (ref 0.1–1.0)
Monocytes Relative: 11 %
Neutro Abs: 4.7 10*3/uL (ref 1.7–7.7)
Neutrophils Relative %: 68 %
Platelets: 123 10*3/uL — ABNORMAL LOW (ref 150–400)
RBC: 3.23 MIL/uL — ABNORMAL LOW (ref 3.87–5.11)
RDW: 12.1 % (ref 11.5–15.5)
WBC: 6.9 10*3/uL (ref 4.0–10.5)
nRBC: 0 % (ref 0.0–0.2)

## 2022-08-09 LAB — COMPREHENSIVE METABOLIC PANEL
ALT: 23 U/L (ref 0–44)
AST: 60 U/L — ABNORMAL HIGH (ref 15–41)
Albumin: 4.1 g/dL (ref 3.5–5.0)
Alkaline Phosphatase: 68 U/L (ref 38–126)
Anion gap: 8 (ref 5–15)
BUN: 33 mg/dL — ABNORMAL HIGH (ref 8–23)
CO2: 19 mmol/L — ABNORMAL LOW (ref 22–32)
Calcium: 9.9 mg/dL (ref 8.9–10.3)
Chloride: 116 mmol/L — ABNORMAL HIGH (ref 98–111)
Creatinine, Ser: 2.09 mg/dL — ABNORMAL HIGH (ref 0.44–1.00)
GFR, Estimated: 25 mL/min — ABNORMAL LOW (ref >60)
Glucose, Bld: 194 mg/dL — ABNORMAL HIGH (ref 70–99)
Potassium: 4.2 mmol/L (ref 3.5–5.1)
Sodium: 143 mmol/L (ref 135–145)
Total Bilirubin: 1.9 mg/dL — ABNORMAL HIGH (ref 0.3–1.2)
Total Protein: 7.9 g/dL (ref 6.5–8.1)

## 2022-08-09 LAB — AMMONIA: Ammonia: 86 umol/L — ABNORMAL HIGH (ref 9–35)

## 2022-08-09 LAB — CK: Total CK: 637 U/L — ABNORMAL HIGH (ref 38–234)

## 2022-08-09 LAB — LACTIC ACID, PLASMA: Lactic Acid, Venous: 2.5 mmol/L (ref 0.5–1.9)

## 2022-08-09 LAB — TROPONIN I (HIGH SENSITIVITY): Troponin I (High Sensitivity): 17 ng/L (ref <18)

## 2022-08-09 MED ORDER — LACTULOSE 10 GM/15ML PO SOLN
20.0000 g | Freq: Once | ORAL | Status: DC
  Filled 2022-08-09: qty 30

## 2022-08-09 MED ORDER — HALOPERIDOL LACTATE 5 MG/ML IJ SOLN
2.0000 mg | Freq: Four times a day (QID) | INTRAMUSCULAR | Status: DC | PRN
  Administered 2022-08-09: 2 mg via INTRAVENOUS
  Filled 2022-08-09: qty 1

## 2022-08-09 MED ORDER — SODIUM CHLORIDE 0.9 % IV BOLUS
500.0000 mL | Freq: Once | INTRAVENOUS | Status: AC
  Administered 2022-08-09: 500 mL via INTRAVENOUS

## 2022-08-09 NOTE — H&P (Signed)
History and Physical   [facility] [place] [facility] Admission History and Physical [employer], D.O.    Patient Name: Sheila Bowman MR#: [id number] Date of Birth: [birth year] Date of Admission: 08/09/2022  Referring MD/NP/PA: Dr. Fuller Plan Primary Care Physician: Aderoju, Marin Olp, MD  Chief Complaint:  Chief Complaint  Patient presents with   Altered Mental Status  Please note the entire history is obtained from the patient's emergency department chart, emergency department provider.Patient's personal history is limited by altered mental status.   HPI: Sheila Bowman is a 71 y.o. female with a known history of diabetes, hypertension, liver cirrhosis presents to the emergency department from her long-term care facility for evaluation of altered mental status and elevated ammonia.  Patient was seen here previously for the same complaints on August 7.  Today she was found with altered mental status and elevated ammonia level.  During my visit with her she is very agitated, climbing out of bed, sticking her tongue out, swinging, and refused to allow me to examine her.      EMS/ED Course: Patient received lactulose. Medical admission has been requested for further management of hepatic encephalopathy and AKI  Review of Systems:  Unable to obtain 2/2 AMS   Past Medical History:  Diagnosis Date   Diabetes mellitus without complication (HCC)    Hypertension     Past Surgical History:  Procedure Laterality Date   ERCP N/A 08/29/2021   Procedure: ENDOSCOPIC RETROGRADE CHOLANGIOPANCREATOGRAPHY (ERCP);  Surgeon: Midge Minium, MD;  Location: [facility] ENDOSCOPY;  Service: Endoscopy;  Laterality: N/A;     reports that she has quit smoking. She has never used smokeless tobacco. She reports current alcohol use. She reports that she does not use drugs.  No Known Allergies  Family History  Problem Relation Age of Onset   Heart attack Father     Prior to Admission  medications   Medication Sig Start Date End Date Taking? Authorizing Provider  amLODipine (NORVASC) 5 MG tablet Take 5 mg by mouth daily. 08/07/21  Yes [provider]  ferrous sulfate 325 (65 FE) MG tablet Take 325 mg by mouth every other day.   Yes [provider]  folic acid (FOLVITE) 400 MCG tablet Take 400 mcg by mouth daily.   Yes [provider]  lactulose (CHRONULAC) 10 GM/15ML solution Take 30 mLs by mouth 3 (three) times daily. 08/19/21  Yes [provider]  LORazepam (ATIVAN) 0.5 MG tablet Take 0.5 mg by mouth 2 (two) times daily. 06/07/22  Yes [provider]  Multiple Vitamin (MULTIVITAMIN WITH MINERALS) TABS tablet Take 1 tablet by mouth daily. 02/26/20  Yes Enedina Finner, MD  olmesartan (BENICAR) 40 MG tablet Take 40 mg by mouth daily. 08/02/22  Yes [provider]  progesterone (PROMETRIUM) 200 MG capsule Take 200 mg by mouth at bedtime. 08/07/21  Yes [provider]  sertraline (ZOLOFT) 50 MG tablet Take 50 mg by mouth daily. 08/07/21  Yes [provider]  spironolactone (ALDACTONE) 25 MG tablet Take 0.5 tablets (12.5 mg total) by mouth daily. Patient taking differently: Take 25 mg by mouth every other day. 06/22/22  Yes Dow Adolph N, DO  thiamine 100 MG tablet Take 50 mg by mouth daily.   Yes [provider]  acetaminophen (TYLENOL) 325 MG tablet Take 1 tablet by mouth every 6 (six) hours as needed. 08/08/22   [provider]  loratadine (CLARITIN) 10 MG tablet Take 10 mg by mouth daily. Patient not taking:  Reported on 08/09/2022    [provider]    Physical Exam: Vitals:   08/09/22 1447 08/09/22 1451 08/09/22 1835 08/09/22 1837  BP:  (!) 94/57 (!) 100/57 (!) 100/57  Pulse:  74  66  Resp:  16 16 16   Temp:  98.5 F (36.9 C)  98.2 F (36.8 C)  TempSrc:  Oral  Oral  SpO2:  99%  98%  Weight: 75 kg     Unable to fully comply with exam.  GENERAL: 71 y.o.-year-old white female patient,  lying in the bed in no acute distress.  Agitated, combative, trying to climb out of bed and takes lines off.  HEENT: Head atraumatic, normocephalic. Mucus membranes moist. NECK: Supple. No JVD. EXTREMITIES: No pedal edema, cyanosis, or clubbing. NEUROLOGIC: The patient is alert and oriented x 1.  Unable to comply with full neurologic exam SKIN: Warm, dry, and intact without obvious rash, lesion, or ulcer.    Labs on Admission:  CBC: Recent Labs  Lab 08/06/22 0954 08/09/22 1508  WBC 5.0 6.9  NEUTROABS 2.5 4.7  HGB 10.2* 10.6*  HCT 31.8* 32.3*  MCV 101.3* 100.0  PLT 103* 123*   Basic Metabolic Panel: Recent Labs  Lab 08/06/22 0954 08/09/22 1508  NA 145 143  K 4.7 4.2  CL 120* 116*  CO2 18* 19*  GLUCOSE 105* 194*  BUN 24* 33*  CREATININE 1.13* 2.09*  CALCIUM 9.9 9.9   GFR: Estimated Creatinine Clearance: 25 mL/min (A) (by C-G formula based on SCr of 2.09 mg/dL (H)). Liver Function Tests: Recent Labs  Lab 08/06/22 0954 08/09/22 1508  AST 42* 60*  ALT 20 23  ALKPHOS 67 68  BILITOT 2.1* 1.9*  PROT 7.5 7.9  ALBUMIN 4.0 4.1   No results for input(s): "LIPASE", "AMYLASE" in the last 168 hours. Recent Labs  Lab 08/06/22 0954 08/09/22 1748  AMMONIA 43* 86*   Coagulation Profile: No results for input(s): "INR", "PROTIME" in the last 168 hours. Cardiac Enzymes: Recent Labs  Lab 08/09/22 1558  CKTOTAL 637*   BNP (last 3 results) No results for input(s): "PROBNP" in the last 8760 hours. HbA1C: No results for input(s): "HGBA1C" in the last 72 hours. CBG: No results for input(s): "GLUCAP" in the last 168 hours. Lipid Profile: No results for input(s): "CHOL", "HDL", "LDLCALC", "TRIG", "CHOLHDL", "LDLDIRECT" in the last 72 hours. Thyroid Function Tests: No results for input(s): "TSH", "T4TOTAL", "FREET4", "T3FREE", "THYROIDAB" in the last 72 hours. Anemia Panel: No results for input(s): "VITAMINB12", "FOLATE", "FERRITIN", "TIBC", "IRON", "RETICCTPCT" in the  last 72 hours. Urine analysis:    Component Value Date/Time   COLORURINE YELLOW (A) 06/21/2022 1010   APPEARANCEUR HAZY (A) 06/21/2022 1010   LABSPEC 1.015 06/21/2022 1010   PHURINE 5.0 06/21/2022 1010   GLUCOSEU NEGATIVE 06/21/2022 1010   HGBUR NEGATIVE 06/21/2022 1010   BILIRUBINUR NEGATIVE 06/21/2022 1010   KETONESUR NEGATIVE 06/21/2022 1010   PROTEINUR NEGATIVE 06/21/2022 1010   NITRITE NEGATIVE 06/21/2022 1010   LEUKOCYTESUR NEGATIVE 06/21/2022 1010   Sepsis Labs: @LABRCNTIP (procalcitonin:4,lacticidven:4) )No results found for this or any previous visit (from the past 240 hour(s)).   Radiological Exams on Admission: CT HEAD WO CONTRAST ( )  Result Date: 08/09/2022 CLINICAL DATA:  Altered mental status EXAM: CT HEAD WITHOUT CONTRAST TECHNIQUE: Contiguous axial images were obtained from the base of the skull through the vertex without intravenous contrast. RADIATION DOSE REDUCTION: This exam was performed according to the departmental dose-optimization program which includes automated exposure control, adjustment of the  mA and/or kV according to patient size and/or use of iterative reconstruction technique. COMPARISON:  06/21/2022 FINDINGS: Brain: No acute intracranial findings are seen. There are no signs of bleeding within the cranium. Cortical sulci are prominent. There is decreased density in periventricular white matter. Calcifications are seen in basal ganglia on both sides. Vascular: Unremarkable. Skull: Unremarkable. Sinuses/Orbits: Unremarkable. Other: No significant interval changes are noted. IMPRESSION: No acute intracranial findings are seen in noncontrast CT brain. Atrophy. Electronically Signed   By: Ernie Avena M.D.   On: 08/09/2022 16:45    Assessment/Plan  This is a 71 y.o. female with a history of diabetes, hypertension, liver cirrhosis now being admitted with:  #.  Hepatic encephalopathy - Admit inpatient - Continue lactulose - Neurochecks every 4  hours -Continue folic acid and thiamine - Haldol given for agitation.    #. Acute kidney injury  - IV fluids and repeat BMP in AM.  - Avoid nephrotoxic medications  #.  Anemia chronic and stable. - Monitor CBC -Continue iron sulfate  #. History of H/o Diabetes - Accuchecks achs with RISS coverage - Heart healthy, carb controlled diet  #. History of hypertension - Continue Norvasc, Avapro for Benicar, Aldactone  #. History of dementia - Continue Zoloft - Will need Ativan for agitation  Admission status: Inpatient IV Fluids: Normal saline Diet/Nutrition: Heart healthy, carb controlled Consults called: None DVT Px: Lovenox, SCDs and early ambulation. Code Status: Full Code  Disposition Plan: To home in 1-2 days  All the records are reviewed and case discussed with ED provider. Management plans discussed with the patient and/or family who express understanding and agree with plan of care.  Caterin Tabares D.O. on 08/09/2022 at 7:02 PM CC: Primary care physician; Aderoju, Marin Olp, MD   08/09/2022, 7:02 PM

## 2022-08-09 NOTE — ED Provider Notes (Signed)
[facility] Provider Note    Event Date/Time   First MD Initiated Contact with Patient 08/09/22 1527     (approximate)   History   Altered Mental Status   HPI  Sheila Bowman is a 71 y.o. female  alcoholic liver cirrhosis, diabetes mellitus, hypertension and elevated ammonia and comes in with increasing mental status issues.  Pt comes in from long term care facility at [facility]. Pt hospital discharge was reviewed from 6/22-6/23.  There were concerned that patient was more altered than her normal self.  She does have a history of elevated ammonia levels.  Unclear if she had any falls but does not sound like any known falls.        Physical Exam   Triage Vital Signs: ED Triage Vitals  Enc Vitals Group     BP 08/09/22 1451 (!) 94/57     Pulse Rate 08/09/22 1451 74     Resp 08/09/22 1451 16     Temp 08/09/22 1451 98.5 F (36.9 C)     Temp Source 08/09/22 1451 Oral     SpO2 08/09/22 1451 99 %     Weight 08/09/22 1447 165 lb 5.5 oz (75 kg)     Height --      Head Circumference --      Peak Flow --      Pain Score 08/09/22 1446 0     Pain Loc --      Pain Edu? --      Excl. in GC? --     Most recent vital signs: Vitals:   08/09/22 1451  BP: (!) 94/57  Pulse: 74  Resp: 16  Temp: 98.5 F (36.9 C)  SpO2: 99%     General: Awake, no distress.  CV:  Good peripheral perfusion.  Resp:  Normal effort.  Abd:  No distention.  Soft and nontender Other:  Pt AO x2- does not know the year she moves all extremities   ED Results / Procedures / Treatments   Labs (all labs ordered are listed, but only abnormal results are displayed) Labs Reviewed  CBC WITH DIFFERENTIAL/PLATELET - Abnormal; Notable for the following components:      Result Value   RBC 3.23 (*)    Hemoglobin 10.6 (*)    HCT 32.3 (*)    Platelets 123 (*)    All other components within normal limits  CULTURE, BLOOD (ROUTINE X 2)  CULTURE, BLOOD (ROUTINE X 2)  COMPREHENSIVE  METABOLIC PANEL  LACTIC ACID, PLASMA  URINALYSIS, ROUTINE W REFLEX MICROSCOPIC  LACTIC ACID, PLASMA     EKG  My interpretation of EKG:  Normal sinus rate of 68 without any ST elevation or T wave inversions, normal intervals  RADIOLOGY I have reviewed the CT head personally interpreted no evidence of intracranial hemorrhage  PROCEDURES:  Critical Care performed: No  .1-3 Lead EKG Interpretation  Performed by: Concha Se, MD Authorized by: Concha Se, MD     Interpretation: normal     ECG rate:  60   ECG rate assessment: normal     Rhythm: sinus rhythm     Ectopy: none     Conduction: normal      MEDICATIONS ORDERED IN ED: Medications - No data to display   IMPRESSION / MDM / ASSESSMENT AND PLAN / ED COURSE  I reviewed the triage vital signs and the nursing notes.   Patient's presentation is most consistent with acute presentation with potential  threat to life or bodily function.   Differential includes dehydration, infection, hyper ammonia level.  UTI.  CK slightly elevated patient getting some IV fluid.  Ammonia level elevated we will give some lactulose.  Troponin is negative.  Creatinine is elevated concerning for dehydration.  Her white count is normal therefore does not meet any sepsis criteria CT head was reassuring.  Given altered mental status with elevated ammonia and AKI we will discuss possible team for admission    The patient is on the cardiac monitor to evaluate for evidence of arrhythmia and/or significant heart rate changes.      FINAL CLINICAL IMPRESSION(S) / ED DIAGNOSES   Final diagnoses:  Hyperammonemia (HCC)  Altered mental status, unspecified altered mental status type  AKI (acute kidney injury) (HCC)     Rx / DC Orders   ED Discharge Orders     None        Note:  This document was prepared using Dragon voice recognition software and may include unintentional dictation errors.   Concha Se, MD 08/09/22 Norberta Keens

## 2022-08-09 NOTE — ED Triage Notes (Signed)
First Nurse: Pt here via [facility] from [facility] with AMS. Pt was here recently for same, ammonia normal.    98/67 166-cbg 74 9% RA
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
## 2022-08-09 NOTE — ED Triage Notes (Signed)
PT arriving from [facility] with AMS today. Pt smells strongly of urine.

## 2022-08-10 DIAGNOSIS — N179 Acute kidney failure, unspecified: Secondary | ICD-10-CM | POA: Diagnosis not present

## 2022-08-10 DIAGNOSIS — K7030 Alcoholic cirrhosis of liver without ascites: Secondary | ICD-10-CM

## 2022-08-10 DIAGNOSIS — F0394 Unspecified dementia, unspecified severity, with anxiety: Secondary | ICD-10-CM

## 2022-08-10 DIAGNOSIS — K7682 Hepatic encephalopathy: Secondary | ICD-10-CM | POA: Diagnosis not present

## 2022-08-10 DIAGNOSIS — D539 Nutritional anemia, unspecified: Secondary | ICD-10-CM

## 2022-08-10 DIAGNOSIS — N1831 Chronic kidney disease, stage 3a: Secondary | ICD-10-CM

## 2022-08-10 DIAGNOSIS — I10 Essential (primary) hypertension: Secondary | ICD-10-CM

## 2022-08-10 LAB — HEMOGLOBIN A1C
Hgb A1c MFr Bld: 5.2 % (ref 4.8–5.6)
Mean Plasma Glucose: 102.54 mg/dL

## 2022-08-10 LAB — URINALYSIS, ROUTINE W REFLEX MICROSCOPIC
Bilirubin Urine: NEGATIVE
Glucose, UA: NEGATIVE mg/dL
Hgb urine dipstick: NEGATIVE
Ketones, ur: NEGATIVE mg/dL
Leukocytes,Ua: NEGATIVE
Nitrite: NEGATIVE
Protein, ur: NEGATIVE mg/dL
Specific Gravity, Urine: 1.019 (ref 1.005–1.030)
pH: 5 (ref 5.0–8.0)

## 2022-08-10 LAB — COMPREHENSIVE METABOLIC PANEL
ALT: 20 U/L (ref 0–44)
AST: 55 U/L — ABNORMAL HIGH (ref 15–41)
Albumin: 3.4 g/dL — ABNORMAL LOW (ref 3.5–5.0)
Alkaline Phosphatase: 60 U/L (ref 38–126)
Anion gap: 5 (ref 5–15)
BUN: 31 mg/dL — ABNORMAL HIGH (ref 8–23)
CO2: 15 mmol/L — ABNORMAL LOW (ref 22–32)
Calcium: 9.2 mg/dL (ref 8.9–10.3)
Chloride: 124 mmol/L — ABNORMAL HIGH (ref 98–111)
Creatinine, Ser: 1.52 mg/dL — ABNORMAL HIGH (ref 0.44–1.00)
GFR, Estimated: 36 mL/min — ABNORMAL LOW (ref >60)
Glucose, Bld: 99 mg/dL (ref 70–99)
Potassium: 4 mmol/L (ref 3.5–5.1)
Sodium: 144 mmol/L (ref 135–145)
Total Bilirubin: 1.4 mg/dL — ABNORMAL HIGH (ref 0.3–1.2)
Total Protein: 6.4 g/dL — ABNORMAL LOW (ref 6.5–8.1)

## 2022-08-10 LAB — CBC
HCT: 28.4 % — ABNORMAL LOW (ref 36.0–46.0)
Hemoglobin: 9.2 g/dL — ABNORMAL LOW (ref 12.0–15.0)
MCH: 32.9 pg (ref 26.0–34.0)
MCHC: 32.4 g/dL (ref 30.0–36.0)
MCV: 101.4 fL — ABNORMAL HIGH (ref 80.0–100.0)
Platelets: 72 10*3/uL — ABNORMAL LOW (ref 150–400)
RBC: 2.8 MIL/uL — ABNORMAL LOW (ref 3.87–5.11)
RDW: 12.1 % (ref 11.5–15.5)
WBC: 5.3 10*3/uL (ref 4.0–10.5)
nRBC: 0 % (ref 0.0–0.2)

## 2022-08-10 LAB — GLUCOSE, CAPILLARY
Glucose-Capillary: 126 mg/dL — ABNORMAL HIGH (ref 70–99)
Glucose-Capillary: 101 mg/dL — ABNORMAL HIGH (ref 70–99)

## 2022-08-10 LAB — AMMONIA: Ammonia: 68 umol/L — ABNORMAL HIGH (ref 9–35)

## 2022-08-10 LAB — CBG MONITORING, ED
Glucose-Capillary: 101 mg/dL — ABNORMAL HIGH (ref 70–99)
Glucose-Capillary: 105 mg/dL — ABNORMAL HIGH (ref 70–99)

## 2022-08-10 LAB — LACTIC ACID, PLASMA: Lactic Acid, Venous: 0.9 mmol/L (ref 0.5–1.9)

## 2022-08-10 MED ORDER — RIFAXIMIN 200 MG PO TABS
200.0000 mg | ORAL_TABLET | Freq: Three times a day (TID) | ORAL | Status: DC
  Filled 2022-08-10 (×2): qty 1

## 2022-08-10 MED ORDER — MORPHINE SULFATE (PF) 2 MG/ML IV SOLN: 1.0000 mg | Freq: Four times a day (QID) | INTRAVENOUS | Status: DC | PRN

## 2022-08-10 MED ORDER — SODIUM CHLORIDE 0.9 % IV SOLN: INTRAVENOUS | Status: DC

## 2022-08-10 MED ORDER — INSULIN ASPART 100 UNIT/ML IJ SOLN: 0.0000 [IU] | Freq: Every day | INTRAMUSCULAR | Status: DC

## 2022-08-10 MED ORDER — HYDROCODONE-ACETAMINOPHEN 5-325 MG PO TABS: 1.0000 | ORAL_TABLET | ORAL | Status: DC | PRN

## 2022-08-10 MED ORDER — ONDANSETRON HCL 4 MG PO TABS: 4.0000 mg | ORAL_TABLET | Freq: Four times a day (QID) | ORAL | Status: DC | PRN

## 2022-08-10 MED ORDER — PROGESTERONE MICRONIZED 100 MG PO CAPS
200.0000 mg | ORAL_CAPSULE | Freq: Every day | ORAL | Status: DC
Start: 2022-08-10 — End: 2022-08-10
  Filled 2022-08-10: qty 2

## 2022-08-10 MED ORDER — SENNOSIDES-DOCUSATE SODIUM 8.6-50 MG PO TABS: 1.0000 | ORAL_TABLET | Freq: Every evening | ORAL | Status: DC | PRN

## 2022-08-10 MED ORDER — FERROUS SULFATE 325 (65 FE) MG PO TABS
325.0000 mg | ORAL_TABLET | ORAL | Status: DC
  Filled 2022-08-10: qty 1

## 2022-08-10 MED ORDER — SPIRONOLACTONE 25 MG PO TABS: 25.0000 mg | ORAL_TABLET | ORAL | Status: DC

## 2022-08-10 MED ORDER — ENOXAPARIN SODIUM 40 MG/0.4ML IJ SOSY: 40.0000 mg | PREFILLED_SYRINGE | INTRAMUSCULAR | Status: DC

## 2022-08-10 MED ORDER — AMLODIPINE BESYLATE 5 MG PO TABS
5.0000 mg | ORAL_TABLET | Freq: Every day | ORAL | Status: DC
  Filled 2022-08-10: qty 1

## 2022-08-10 MED ORDER — THIAMINE HCL 100 MG PO TABS
50.0000 mg | ORAL_TABLET | Freq: Every day | ORAL | Status: DC
  Filled 2022-08-10: qty 1

## 2022-08-10 MED ORDER — ADULT MULTIVITAMIN W/MINERALS CH
1.0000 | ORAL_TABLET | Freq: Every day | ORAL | Status: DC
  Filled 2022-08-10: qty 1

## 2022-08-10 MED ORDER — BISACODYL 5 MG PO TBEC: 5.0000 mg | DELAYED_RELEASE_TABLET | Freq: Every day | ORAL | Status: DC | PRN

## 2022-08-10 MED ORDER — SODIUM CHLORIDE 0.9% FLUSH: 3.0000 mL | Freq: Two times a day (BID) | INTRAVENOUS | Status: DC

## 2022-08-10 MED ORDER — SERTRALINE HCL 50 MG PO TABS
50.0000 mg | ORAL_TABLET | Freq: Every day | ORAL | Status: DC
Start: 2022-08-10 — End: 2022-08-10
  Filled 2022-08-10: qty 1

## 2022-08-10 MED ORDER — HYDRALAZINE HCL 20 MG/ML IJ SOLN: 5.0000 mg | Freq: Four times a day (QID) | INTRAMUSCULAR | Status: DC | PRN

## 2022-08-10 MED ORDER — LACTULOSE 10 GM/15ML PO SOLN
20.0000 g | Freq: Three times a day (TID) | ORAL | Status: DC
  Filled 2022-08-10: qty 30

## 2022-08-10 MED ORDER — IRBESARTAN 150 MG PO TABS
300.0000 mg | ORAL_TABLET | Freq: Every day | ORAL | Status: DC
  Filled 2022-08-10 (×2): qty 2

## 2022-08-10 MED ORDER — ONDANSETRON HCL 4 MG/2ML IJ SOLN: 4.0000 mg | Freq: Four times a day (QID) | INTRAMUSCULAR | Status: DC | PRN

## 2022-08-10 MED ORDER — ENOXAPARIN SODIUM 30 MG/0.3ML IJ SOSY
30.0000 mg | PREFILLED_SYRINGE | INTRAMUSCULAR | Status: DC
Start: 2022-08-10 — End: 2022-08-10
  Administered 2022-08-10: 30 mg via SUBCUTANEOUS
  Filled 2022-08-10: qty 0.3

## 2022-08-10 MED ORDER — FOLIC ACID 1 MG PO TABS
500.0000 ug | ORAL_TABLET | Freq: Every day | ORAL | Status: DC
  Filled 2022-08-10: qty 1

## 2022-08-10 MED ORDER — INSULIN ASPART 100 UNIT/ML IJ SOLN: 0.0000 [IU] | Freq: Three times a day (TID) | INTRAMUSCULAR | Status: DC

## 2022-08-10 NOTE — IPAL (Signed)
  Interdisciplinary Goals of Care Family Meeting   Date carried out: 08/10/2022  Location of the meeting: Bedside  Member's involved: Physician and Family Member or next of kin  Durable Power of Attorney or acting medical decision maker: Sheila Bowman, daughter    Discussion: We discussed goals of care for Sheila Bowman .  In light of her declining health in the last 2 years, her nonadherence, behavior, and combativeness with cares at her facility being a reflection family believe of her not having a reasonable and acceptable quality of life in a locked memory care unit, and the terminal nature of her liver disease, exacerbated by difficult to treat hepatic encephalopathy and recurrent hospitalizations, family feel that repeatedly treating her hepatic encephalopathy is not changing her overall downward trajectory and that her quality of life has now deteriorated to the point that she would not want to continue on her current state.  They feel that she would want to transition to comfort measures and allow natural course.  Code status: Full DNR  Disposition: Hospice, allow a natural death, explore residential Hospice if possible  Time spent for the meeting: 35 minutes    Sheila Sam, MD  08/10/2022, 4:27 PM

## 2022-08-10 NOTE — Progress Notes (Signed)
Pts BP 109/53, MD notified, per MD hold BP meds.

## 2022-08-10 NOTE — Progress Notes (Signed)
PHARMACIST - PHYSICIAN COMMUNICATION  CONCERNING:  Enoxaparin (Lovenox) for DVT Prophylaxis    RECOMMENDATION: Patient was prescribed enoxaprin 30 mg q24 hours for VTE prophylaxis.   Filed Weights   08/09/22 1447  Weight: 75 kg (165 lb 5.5 oz)    Body mass index is 27.51 kg/m.  Estimated Creatinine Clearance: 34.4 mL/min (A) (by C-G formula based on SCr of 1.52 mg/dL (H)).   Patient is candidate for enoxaparin 40 mg every 24 hours based on CrCl >47ml/min and Weight >45kg  DESCRIPTION: Pharmacy has adjusted enoxaparin dose per [facility] policy.  Patient is now receiving enoxaparin 40 mg every 24 hours    Elliot Gurney, PharmD Clinical Pharmacist  08/10/2022 2:26 PM

## 2022-08-10 NOTE — TOC Initial Note (Addendum)
Transition of Care [facility]) - Initial/Assessment Note    Patient Details  Name: Sheila Bowman MRN: [id number] Date of Birth: October 11, 1951  Transition of Care [facility]) CM/SW Contact:    Truddie Hidden, RN Phone Number: 08/10/2022, 3:06 PM  Clinical Narrative:                 Spoke with patient's daughter at bedside. Patient daughter stated patient is unable to return to the facility due to increased level of care. Patient had [place] hospice prior to this hospital admission. Patient daughter is requesting SNF level of care. Does not have a preference.   Contacted Burman Nieves at [facility] @ [phone] to confirm patient's status. Deanna Artis confirmed patient is unable to return to facility.   4:17pm Spoke with [name], patient's daughter. Family is deciding on comfort measures. Stated they will make a decision by tomorrow.         Patient Goals and CMS Choice        Expected Discharge Plan and Services                                                Prior Living Arrangements/Services                       Activities of Daily Living      Permission Sought/Granted                  Emotional Assessment              Admission diagnosis:  Hepatic encephalopathy (HCC) [K76.82] Hyperammonemia (HCC) [E72.20] AKI (acute kidney injury) (HCC) [N17.9] Altered mental status, unspecified altered mental status type [R41.82] Patient Active Problem List   Diagnosis Date Noted   AKI (acute kidney injury) (HCC) 08/10/2022   Stage 3a chronic kidney disease (CKD) (HCC) 08/10/2022   Hepatic encephalopathy (HCC) 08/09/2022   Dehydration 06/21/2022   Dementia (HCC) 06/21/2022   RUQ pain    Choledocholithiasis 08/28/2021   Hospice care    FTT (failure to thrive) in adult    Acute hepatic encephalopathy (HCC) 03/26/2020   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 [place] - [address] [place] [address] Phone: [phone] Fax: [phone]  [id number] [place], [place] - [address] [place] [address] Phone: [phone] Fax: [phone]     Social Determinants of Health (SDOH) Interventions    Readmission Risk Interventions     No data to display

## 2022-08-10 NOTE — Progress Notes (Signed)
    Palliative Medicine Team    Palliative Medicine consult noted. Due to high referral volume, there may be a delay seeing this patient. Please call the Palliative Medicine Team office at [phone] if recommendations are needed in the interim. A provider will be available on site 08/13/22. If patient is expected to discharge before this date, please make outpatient palliative care referral as appropriate.        Thank you for inviting us to participate in the care of this patient.   Debra Colon, MSN, RN Palliative Medicine Team  

## 2022-08-10 NOTE — ED Notes (Signed)
Informed RN bed assigned 

## 2022-08-10 NOTE — Progress Notes (Signed)
  Progress Note   Patient: Sheila Bowman EVO:[id number] DOB: 1951/06/11 DOA: 08/09/2022     1 DOS: the patient was seen and examined on 08/10/2022 at 13:31      Brief hospital course: Sheila Bowman is a 71 y.o. F with alcoholic dementia, cirrhosis, DM, and HTN who presented from ALF for confusion and hyperammonemia.   8/10: Admitted on lactulose, IV fluids     Assessment and Plan: * Hepatic encephalopathy (HCC) Stage 3a chronic kidney disease (CKD) (HCC) AKI (acute kidney injury) (HCC) Dementia (HCC) Macrocytic anemia Essential hypertension Type 2 diabetes mellitus without complication (HCC) Alcoholic cirrhosis of liver without ascites (HCC) Creatinines have drifted up to the 0.9-1.1 Harold in last year, probably CKD IIIa  Creatinine here >2 at admission.    During the day today, the apteint has mostly been somnolent, not eating.  She has pulled out her IV.  Her mentation has not improved and she has had very little oral intake.  See prior IPAL disucssion.   - Transition to comfort care -S top fluids, lactulose and rifaximin - Stop insulins, stop labs checks - Full comfort measure, consult Hospice           Subjective: The patient is able to wake up, but she is disoriented, falls back asleep immediately.  She had no respiratory distress, vomiting.  She has been mostly somnolent today.     Physical Exam: Vitals:   08/10/22 0730 08/10/22 0840 08/10/22 1105 08/10/22 1124  BP: 105/77 135/89 (!) 109/53 118/73  Pulse: 78 74  65  Resp: 16 18  16   Temp: 97.8 F (36.6 C) 97.9 F (36.6 C)  97.6 F (36.4 C)  TempSrc: Oral Oral    SpO2: 93% 100%  100%  Weight:       Elderly adult female, overweight, lying in bed, somnolent, opens eyes briefly, makes a few incoherent answers and falls back asleep RRR, no murmurs, no peripheral edema Respiratory rate normal, lungs clear without rales or wheezes Abdomen soft, no ascites, no pain or tenderness to palpation Attention  diminished, affect blunted, psychomotor slowing noted      Data Reviewed: Basic metabolic panel notable for creatinine down to 1.5 from 2.09 Ammonia 86 CT head unremarkable Bicarb 15 Hemoglobin 9.2, similar to baseline, macrocytic  Family Communication: Son and daughter at the bedside    Disposition: Status is: Inpatient The patient was admitted for renal failure, dehydration, and hepatic encephalopathy  After long discussion of goals of care with family, it was clear that the patient would not want to continue treatment since she has had a deteriorating quality of life  Stop lactulose, rifaximin, fluids, allow natural death, consult hospice        Author: , MD 08/10/2022 4:32 PM  For on call review [email or web address].

## 2022-08-10 NOTE — Hospital Course (Addendum)
Sheila Bowman is a 71 y.o. F with alcoholic dementia, cirrhosis, DM, and HTN who presented from ALF for confusion and hyperammonemia.   8/10: Admitted on lactulose, IV fluids

## 2022-08-10 NOTE — Progress Notes (Signed)
PHARMACIST - PHYSICIAN COMMUNICATION  CONCERNING:  Enoxaparin (Lovenox) for DVT Prophylaxis    RECOMMENDATION: Patient was prescribed enoxaprin 40mg  q24 hours for VTE prophylaxis.   Filed Weights   08/09/22 1447  Weight: 75 kg (165 lb 5.5 oz)    Body mass index is 27.51 kg/m.  Estimated Creatinine Clearance: 25 mL/min (A) (by C-G formula based on SCr of 2.09 mg/dL (H)).  Patient is candidate for enoxaparin 30mg  every 24 hours based on CrCl <42ml/min or Weight <45kg  DESCRIPTION: Pharmacy has adjusted enoxaparin dose per [facility] policy.  Patient is now receiving enoxaparin 30 mg every 24 hours   31m, PharmD, [facility] 08/10/2022 12:51 AM

## 2022-08-10 NOTE — Assessment & Plan Note (Signed)
Creatinines have drifted up to the 0.9-1.1 [place] in last year, probably CKD IIIa

## 2022-08-11 DIAGNOSIS — K7030 Alcoholic cirrhosis of liver without ascites: Secondary | ICD-10-CM | POA: Diagnosis not present

## 2022-08-11 DIAGNOSIS — N179 Acute kidney failure, unspecified: Secondary | ICD-10-CM | POA: Diagnosis not present

## 2022-08-11 DIAGNOSIS — F0394 Unspecified dementia, unspecified severity, with anxiety: Secondary | ICD-10-CM

## 2022-08-11 DIAGNOSIS — K7682 Hepatic encephalopathy: Secondary | ICD-10-CM | POA: Diagnosis not present

## 2022-08-11 NOTE — Plan of Care (Signed)
  Problem: Skin Integrity: Goal: Risk for impaired skin integrity will decrease Outcome: Progressing   Problem: Activity: Goal: Risk for activity intolerance will decrease Outcome: Progressing   Problem: Nutrition: Goal: Adequate nutrition will be maintained Outcome: Progressing   Problem: Elimination: Goal: Will not experience complications related to urinary retention Outcome: Progressing   Problem: Pain Managment: Goal: General experience of comfort will improve Outcome: Progressing   Problem: Safety: Goal: Ability to remain free from injury will improve Outcome: Progressing

## 2022-08-11 NOTE — Plan of Care (Signed)
  Problem: Coping: Goal: Ability to adjust to condition or change in health will improve Outcome: Progressing   Problem: Fluid Volume: Goal: Ability to maintain a balanced intake and output will improve Outcome: Progressing   Problem: Health Behavior/Discharge Planning: Goal: Ability to identify and utilize available resources and services will improve Outcome: Progressing   Problem: Health Behavior/Discharge Planning: Goal: Ability to manage health-related needs will improve Outcome: Progressing   Problem: Metabolic: Goal: Ability to maintain appropriate glucose levels will improve Outcome: Progressing   Problem: Nutritional: Goal: Maintenance of adequate nutrition will improve Outcome: Progressing   Problem: Nutritional: Goal: Progress toward achieving an optimal weight will improve Outcome: Progressing   Problem: Skin Integrity: Goal: Risk for impaired skin integrity will decrease Outcome: Progressing   Problem: Tissue Perfusion: Goal: Adequacy of tissue perfusion will improve Outcome: Progressing   Problem: Education: Goal: Knowledge of General Education information will improve Description: Including pain rating scale, medication(s)/side effects and non-pharmacologic comfort measures Outcome: Progressing   Problem: Health Behavior/Discharge Planning: Goal: Ability to manage health-related needs will improve Outcome: Progressing   Problem: Clinical Measurements: Goal: Ability to maintain clinical measurements within normal limits will improve Outcome: Progressing   Problem: Clinical Measurements: Goal: Will remain free from infection Outcome: Progressing   Problem: Clinical Measurements: Goal: Diagnostic test results will improve Outcome: Progressing   Problem: Clinical Measurements: Goal: Respiratory complications will improve Outcome: Progressing   Problem: Clinical Measurements: Goal: Cardiovascular complication will be avoided Outcome:  Progressing   Problem: Activity: Goal: Risk for activity intolerance will decrease Outcome: Progressing   Problem: Nutrition: Goal: Adequate nutrition will be maintained Outcome: Progressing   Problem: Coping: Goal: Level of anxiety will decrease Outcome: Progressing   Problem: Elimination: Goal: Will not experience complications related to bowel motility Outcome: Progressing   Problem: Elimination: Goal: Will not experience complications related to urinary retention Outcome: Progressing   Problem: Pain Managment: Goal: General experience of comfort will improve Outcome: Progressing   Problem: Safety: Goal: Ability to remain free from injury will improve Outcome: Progressing   Problem: Skin Integrity: Goal: Risk for impaired skin integrity will decrease Outcome: Progressing

## 2022-08-11 NOTE — Progress Notes (Signed)
  Progress Note   Patient: Sheila Bowman KHT:[id number] DOB: June 19, 1951 DOA: 08/09/2022     2 DOS: the patient was seen and examined on 08/11/2022       Brief hospital course: Sheila Bowman is a 71 y.o. F with alcoholic dementia, cirrhosis, DM, and HTN who presented from ALF for confusion and hyperammonemia.   8/10: Admitted on lactulose, IV fluids     Assessment and Plan: *Acute metabolic encephalopathy due to hepatic encephalopathy (HCC) Stage 3a chronic kidney disease (CKD) (HCC) AKI (acute kidney injury) (HCC) Dementia (HCC) Macrocytic anemia Essential hypertension Type 2 diabetes mellitus without complication (HCC) Alcoholic cirrhosis of liver without ascites (HCC) Patient was admitted with renal failure, encephalopathy, dehydration.  Family/POA and I discussed her declining health over the last 2 years, declining quality of life, and terminal nature of her liver disease exacerbated by difficult to treat hepatic encephalopathy and recurrent hospitalizations.  We will transition to comfort care, on her lactulose, fluids, and rifaximin was stopped.  Over the last 24 hours, she is somnolent most of the day, appears dehydrated, but seems to have fairly good oral intake.   - We will consult hospice for further advice regarding appropriate site of end-of-life care - Full comfort measures        Subjective: The patient is somnolent.  She arouses sluggishly, states negative when asked if she has any complaints or pains, but her psychomotor slowing precludes reliable history taking.    Physical Exam: Vitals:   08/10/22 1813 08/10/22 2019 08/11/22 0423 08/11/22 0918  BP: (!) 125/58 105/62 (!) 110/59 (!) 117/59  Pulse: 70 66 73 73  Resp: 16 18 16 16   Temp: 97.7 F (36.5 C) 98.3 F (36.8 C) 98.4 F (36.9 C) 97.6 F (36.4 C)  TempSrc:      SpO2: 100% 99% 99% 98%  Weight:       Elderly adult female, overweight, lying in bed, sleeping, opens eyes briefly to stimulation,  makes a few incoherent statements and then falls back asleep RRR, no murmurs, mild nonpitting peripheral edema Respiratory rate shallow, slow, lungs clear without rales or wheezes Abdomen soft without ascites or tenderness to palpation Attention diminished, affect blunted, psychomotor slowing noted, face symmetric, speech slow but, extremity strength is very weak in a symmetric manner        Data Reviewed: No further labs     Disposition: Status is: Inpatient The patient was admitted for renal failure, dehydration, and hepatic encephalopathy  After long discussion of goals of care with family, it was clear that the patient would not want to continue treatment since she has had a deteriorating quality of life  Stop lactulose, rifaximin, fluids, allow natural death, consult hospice        Author: , MD 08/11/2022 3:29 PM  For on call review [email or web address].

## 2022-08-12 DIAGNOSIS — K7682 Hepatic encephalopathy: Secondary | ICD-10-CM | POA: Diagnosis not present

## 2022-08-12 MED ORDER — OXYCODONE HCL 5 MG PO TABS: 5.0000 mg | ORAL_TABLET | Freq: Four times a day (QID) | ORAL | 0 refills | Status: AC | PRN

## 2022-08-12 MED ORDER — OXYCODONE HCL 5 MG PO TABS: 5.0000 mg | ORAL_TABLET | Freq: Four times a day (QID) | ORAL | Status: DC | PRN

## 2022-08-12 MED ORDER — ONDANSETRON HCL 4 MG PO TABS: 4.0000 mg | ORAL_TABLET | Freq: Four times a day (QID) | ORAL | 0 refills | Status: AC | PRN

## 2022-08-12 NOTE — Progress Notes (Addendum)
[facility]   Civil engineer, contracting [facility]) Hospital Liaison Note   Received request from [facility] Manager, Marita Snellen, for family interest in Hospice Home. Spoke with daughter, [name] [facility]), to confirm interest and explain services. Family agreeable to transfer today. Patient Appropriate and approved for hospice home. MD and Transitions care manager aware. Transportation arranged for 8pm.   RN please call report to [phone] prior to patient leaving the unit.   Please send signed and completed DNR with patient at discharge.    MD and TOC aware.     Please call with any questions or concerns.   Thank you,   Renae Fickle MSN RN [facility] Liaison [phone]

## 2022-08-12 NOTE — Discharge Summary (Signed)
Physician Discharge Summary   Patient: Sheila Bowman MRN: [id number] DOB: 1951-03-07  Admit date:     08/09/2022  Discharge date: 08/12/22  Discharge Physician: Alberteen Sam   PCP: Toya Smothers, MD     Recommendations at discharge:  Transfer to Hospice     Discharge Diagnoses: Principal Problem:   Hepatic encephalopathy [facility]) Active Problems:   Alcoholic cirrhosis of liver without ascites (HCC)   Type 2 diabetes mellitus without complication (HCC)   Essential hypertension   Macrocytic anemia   Dementia (HCC)   AKI (acute kidney injury) (HCC)   Stage 3a chronic kidney disease (CKD) [facility])      Hospital Course: Sheila Bowman is a 71 y.o. F with alcoholic dementia, cirrhosis, DM, and HTN who presented from ALF for confusion and hyperammonemia and renal failure.      * Hepatic encephalopathy (HCC) Acute kidney injury on stage 3a chronic kidney disease Alcohol related dementia Macrocytic anemia Essential hypertension Type 2 diabetes mellitus without complication (HCC) Alcoholic cirrhosis of liver without ascites (HCC)   Patient was admitted with renal failure, encephalopathy, dehydration.  After discussion with family/POA, it was clear that she had had declining health over the last 2 years, declining quality of life, and recurrent exacerbations of difficult to treat hepatic encephalopathy leading to recurrent hospitalizations which were incompatible with her wishes for her end of life care.  She was transitioned to comfort measures with the intent to allow a natural course.  She remained somnolent with expected worsening over the next week of her encephalopathy and renal failure.  Hospice was enlisted and she was accepted to [facility]'s inpatient residential hospice facility.                DISCHARGE MEDICATION: Allergies as of 08/12/2022   No Known Allergies      Medication List     STOP taking these medications     acetaminophen 325 MG tablet Commonly known as: TYLENOL   amLODipine 5 MG tablet Commonly known as: NORVASC   ferrous sulfate 325 (65 FE) MG tablet   folic acid 400 MCG tablet Commonly known as: FOLVITE   lactulose 10 GM/15ML solution Commonly known as: CHRONULAC   loratadine 10 MG tablet Commonly known as: CLARITIN   LORazepam 0.5 MG tablet Commonly known as: ATIVAN   multivitamin with minerals Tabs tablet   olmesartan 40 MG tablet Commonly known as: BENICAR   progesterone 200 MG capsule Commonly known as: PROMETRIUM   sertraline 50 MG tablet Commonly known as: ZOLOFT   spironolactone 25 MG tablet Commonly known as: ALDACTONE   thiamine 100 MG tablet Commonly known as: VITAMIN B1       TAKE these medications    ondansetron 4 MG tablet Commonly known as: ZOFRAN Take 1 tablet (4 mg total) by mouth every 6 (six) hours as needed for nausea.   oxyCODONE 5 MG immediate release tablet Commonly known as: Oxy IR/ROXICODONE Take 1 tablet (5 mg total) by mouth every 6 (six) hours as needed for moderate pain.           Discharge Exam: Filed Weights   08/09/22 1447  Weight: 75 kg    71-year-old female, overweight, sleeping, arouses briefly makes incoherent responses and falls back asleep RRR, no murmurs, mild diffuse edema noted, no pitting Respiratory rate normal, shallow, no wheezes or rales appreciated Abdomen without tenderness or grimace to palpation, no distention Attention diminished, somnolent, opens eyes, pupils equal's, speech is slow and halting, she  has psychomotor slowing and generalized weakness   Condition at discharge: worsening  The results of significant diagnostics from this hospitalization (including imaging, microbiology, ancillary and laboratory) are listed below for reference.   Imaging Studies: CT HEAD WO CONTRAST (5MM)  Result Date: 08/09/2022 CLINICAL DATA:  Altered mental status EXAM: CT HEAD WITHOUT CONTRAST TECHNIQUE:  Contiguous axial images were obtained from the base of the skull through the vertex without intravenous contrast. RADIATION DOSE REDUCTION: This exam was performed according to the departmental dose-optimization program which includes automated exposure control, adjustment of the mA and/or kV according to patient size and/or use of iterative reconstruction technique. COMPARISON:  06/21/2022 FINDINGS: Brain: No acute intracranial findings are seen. There are no signs of bleeding within the cranium. Cortical sulci are prominent. There is decreased density in periventricular white matter. Calcifications are seen in basal ganglia on both sides. Vascular: Unremarkable. Skull: Unremarkable. Sinuses/Orbits: Unremarkable. Other: No significant interval changes are noted. IMPRESSION: No acute intracranial findings are seen in noncontrast CT brain. Atrophy. Electronically Signed   By: Elmer Picker M.D.   On: 08/09/2022 16:45    Microbiology: Results for orders placed or performed during the hospital encounter of 08/09/22  Blood culture (routine x 2)     Status: None (Preliminary result)   Collection Time: 08/09/22  3:08 PM   Specimen: Left Antecubital; Blood  Result Value Ref Stegmann Status   Specimen Description LEFT ANTECUBITAL  Final   Special Requests   Final    BOTTLES DRAWN AEROBIC AND ANAEROBIC Blood Culture results may not be optimal due to an excessive volume of blood received in culture bottles   Culture   Final    NO GROWTH 3 DAYS Performed at [facility], [address] [place], [place] [address]    Report Status PENDING  Incomplete  Blood culture (routine x 2)     Status: None (Preliminary result)   Collection Time: 08/09/22  5:49 PM   Specimen: BLOOD LEFT HAND  Result Value Ref Hainer Status   Specimen Description BLOOD LEFT HAND  Final   Special Requests   Final    BOTTLES DRAWN AEROBIC ONLY Blood Culture adequate volume   Culture   Final    NO GROWTH 3 DAYS Performed at  [facility], [address] [place], [place] [address]    Report Status PENDING  Incomplete    Labs: CBC: Recent Labs  Lab 08/06/22 0954 08/09/22 1508 08/10/22 0200  WBC 5.0 6.9 5.3  NEUTROABS 2.5 4.7  --   HGB 10.2* 10.6* 9.2*  HCT 31.8* 32.3* 28.4*  MCV 101.3* 100.0 101.4*  PLT 103* 123* 72*   Basic Metabolic Panel: Recent Labs  Lab 08/06/22 0954 08/09/22 1508 08/10/22 0200  NA 145 143 144  K 4.7 4.2 4.0  CL 120* 116* 124*  CO2 18* 19* 15*  GLUCOSE 105* 194* 99  BUN 24* 33* 31*  CREATININE 1.13* 2.09* 1.52*  CALCIUM 9.9 9.9 9.2   Liver Function Tests: Recent Labs  Lab 08/06/22 0954 08/09/22 1508 08/10/22 0200  AST 42* 60* 55*  ALT 20 23 20   ALKPHOS 67 68 60  BILITOT 2.1* 1.9* 1.4*  PROT 7.5 7.9 6.4*  ALBUMIN 4.0 4.1 3.4*   CBG: Recent Labs  Lab 08/10/22 0113 08/10/22 0746 08/10/22 1128 08/10/22 2259  GLUCAP 101* 105* 126* 101*    Discharge time spent: approximately 35 minutes spent on discharge counseling, evaluation of patient on day of discharge, and coordination of discharge  planning with nursing, social work, pharmacy and case management  Signed: Alberteen Sam, MD [facility] 08/12/2022

## 2022-08-12 NOTE — Progress Notes (Signed)
  Progress Note   Patient: Sheila Bowman WPY:[id number] DOB: [birth year] DOA: 08/09/2022     3 DOS: the patient was seen and examined on 08/12/2022       Brief hospital course: Sheila Bowman is a 71 y.o. F with alcoholic dementia, cirrhosis, DM, and HTN who presented from ALF for confusion and hyperammonemia.   8/10: Admitted on lactulose, IV fluids     Assessment and Plan: *Acute metabolic encephalopathy due to hepatic encephalopathy (HCC) Stage 3a chronic kidney disease (CKD) (HCC) AKI (acute kidney injury) (HCC) Dementia (HCC) Macrocytic anemia Essential hypertension Type 2 diabetes mellitus without complication (HCC) Alcoholic cirrhosis of liver without ascites (HCC) See summary from 8/12.    No change today.  Seems dehydrated but wakes up, answers simple questions very groggily, then falls immediately back to sleep.  Oral intake is inconsistent.  Remains somnolent 99% of the time.    - Consult Hospice - Full comfort measures and allow a natural death        Subjective: Somnolent, complains of mild headache, no chest pain, abdominal pain, no vomiting, no respiratory distress.    Physical Exam: Vitals:   08/11/22 0423 08/11/22 0918 08/11/22 2024 08/12/22 0752  BP: (!) 110/59 (!) 117/59 (!) 144/59 (!) 111/50  Pulse: 73 73 72 65  Resp: 16 16 17    Temp: 98.4 F (36.9 C) 97.6 F (36.4 C)  98 F (36.7 C)  TempSrc:      SpO2: 99% 98% 97% 97%  Weight:       Elderly adult female, overweight, sleeping, arouses eyes briefly, makes some incoherent responses, then falls back asleep RRR, no murmurs Respiratory rate normal, shallow, no wheezes or rales appreciated Abdomen without tenderness or grimace to palpation, no ascites or distention Mild diffuse edema, no pitting Attention diminished, somnolent, opens eyes, pupils equal, speech is slow and halting, she has psychomotor slowing, she has severe generalized weakness.     Data Reviewed: No further labs  Family  communication: Daughter by phone   Disposition: Status is: Inpatient The patient was admitted for renal failure, dehydration, and hepatic encephalopathy  After long discussion of goals of care with family, it was clear that the patient would not want to continue treatment since she has had a deteriorating quality of life  Stop lactulose, rifaximin, fluids, allow natural death, consult hospice        Author: , MD 08/12/2022 11:02 AM  For on call review [email or web address].

## 2022-08-12 NOTE — Plan of Care (Signed)
  Problem: Pain Managment: Goal: General experience of comfort will improve Outcome: Progressing   Problem: Safety: Goal: Ability to remain free from injury will improve Outcome: Progressing   

## 2022-08-12 NOTE — TOC Progression Note (Signed)
Transition of Care [facility]) - Progression Note    Patient Details  Name: Sheila Bowman MRN: [id number] Date of Birth: [birth year]  Transition of Care [facility]) CM/SW Contact  Marlowe Sax, RN Phone Number: 08/12/2022, 9:49 AM  Clinical Narrative:     Spoke with physician,  reached out to [facility] and requested that they do an assessment for Hospice facility, left a secure VM awaiting a response, Requested that they do assessment for facility       Expected Discharge Plan and Services                                                 Social Determinants of Health (SDOH) Interventions    Readmission Risk Interventions     No data to display

## 2022-08-12 NOTE — Progress Notes (Signed)
Report called to Caralee Ates, RN  Civil engineer, contracting, pt waiting on EMS for transportation.

## 2022-08-14 LAB — CULTURE, BLOOD (ROUTINE X 2)
Culture: NO GROWTH
Culture: NO GROWTH
Special Requests: ADEQUATE

## 2022-08-31 DEATH — deceased

## 2022-12-21 ENCOUNTER — Encounter: Payer: Medicare Other | Admitting: Nurse Practitioner
